# Patient Record
Sex: Female | Born: 1990
Health system: Southern US, Community
[De-identification: ages and names within clinical notes are randomized; demographics above are authoritative.]

## PROBLEM LIST (undated history)

## (undated) ENCOUNTER — Inpatient Hospital Stay (HOSPITAL_COMMUNITY): Payer: Self-pay

## (undated) DIAGNOSIS — R0600 Dyspnea, unspecified: Secondary | ICD-10-CM

## (undated) DIAGNOSIS — J45909 Unspecified asthma, uncomplicated: Secondary | ICD-10-CM

## (undated) DIAGNOSIS — F419 Anxiety disorder, unspecified: Secondary | ICD-10-CM

## (undated) DIAGNOSIS — I10 Essential (primary) hypertension: Secondary | ICD-10-CM

## (undated) DIAGNOSIS — F32A Depression, unspecified: Secondary | ICD-10-CM

## (undated) DIAGNOSIS — I639 Cerebral infarction, unspecified: Secondary | ICD-10-CM

## (undated) DIAGNOSIS — Z915 Personal history of self-harm: Secondary | ICD-10-CM

## (undated) DIAGNOSIS — IMO0002 Reserved for concepts with insufficient information to code with codable children: Secondary | ICD-10-CM

## (undated) DIAGNOSIS — F1910 Other psychoactive substance abuse, uncomplicated: Secondary | ICD-10-CM

## (undated) DIAGNOSIS — F329 Major depressive disorder, single episode, unspecified: Secondary | ICD-10-CM

## (undated) DIAGNOSIS — F319 Bipolar disorder, unspecified: Secondary | ICD-10-CM

## (undated) DIAGNOSIS — R519 Headache, unspecified: Secondary | ICD-10-CM

## (undated) DIAGNOSIS — Z9151 Personal history of suicidal behavior: Secondary | ICD-10-CM

## (undated) HISTORY — PX: WISDOM TOOTH EXTRACTION: SHX21

## (undated) HISTORY — DX: Major depressive disorder, single episode, unspecified: F32.9

## (undated) HISTORY — DX: Reserved for concepts with insufficient information to code with codable children: IMO0002

## (undated) HISTORY — PX: DILATION AND CURETTAGE OF UTERUS: SHX78

## (undated) HISTORY — DX: Depression, unspecified: F32.A

---
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
## 2014-09-02 ENCOUNTER — Encounter (HOSPITAL_COMMUNITY): Payer: Self-pay | Admitting: *Deleted

## 2014-09-02 ENCOUNTER — Emergency Department (HOSPITAL_COMMUNITY)
Admission: EM | Admit: 2014-09-02 | Discharge: 2014-09-02 | Disposition: A | Discharge status: Home / Self Care | Payer: Medicaid Other | Attending: Emergency Medicine | Admitting: Emergency Medicine

## 2014-09-02 ENCOUNTER — Emergency Department (HOSPITAL_COMMUNITY): Payer: Medicaid Other

## 2014-09-02 DIAGNOSIS — Z720 Tobacco use: Secondary | ICD-10-CM | POA: Diagnosis not present

## 2014-09-02 DIAGNOSIS — Z3490 Encounter for supervision of normal pregnancy, unspecified, unspecified trimester: Secondary | ICD-10-CM

## 2014-09-02 DIAGNOSIS — Y998 Other external cause status: Secondary | ICD-10-CM | POA: Insufficient documentation

## 2014-09-02 DIAGNOSIS — Y9289 Other specified places as the place of occurrence of the external cause: Secondary | ICD-10-CM | POA: Insufficient documentation

## 2014-09-02 DIAGNOSIS — IMO0002 Reserved for concepts with insufficient information to code with codable children: Secondary | ICD-10-CM

## 2014-09-02 DIAGNOSIS — Z3A01 Less than 8 weeks gestation of pregnancy: Secondary | ICD-10-CM | POA: Diagnosis not present

## 2014-09-02 DIAGNOSIS — I10 Essential (primary) hypertension: Secondary | ICD-10-CM | POA: Insufficient documentation

## 2014-09-02 DIAGNOSIS — W19XXXA Unspecified fall, initial encounter: Secondary | ICD-10-CM

## 2014-09-02 DIAGNOSIS — O9A211 Injury, poisoning and certain other consequences of external causes complicating pregnancy, first trimester: Secondary | ICD-10-CM | POA: Insufficient documentation

## 2014-09-02 DIAGNOSIS — Y9389 Activity, other specified: Secondary | ICD-10-CM | POA: Insufficient documentation

## 2014-09-02 DIAGNOSIS — Z8673 Personal history of transient ischemic attack (TIA), and cerebral infarction without residual deficits: Secondary | ICD-10-CM | POA: Insufficient documentation

## 2014-09-02 DIAGNOSIS — S0181XA Laceration without foreign body of other part of head, initial encounter: Secondary | ICD-10-CM | POA: Diagnosis not present

## 2014-09-02 DIAGNOSIS — W010XXA Fall on same level from slipping, tripping and stumbling without subsequent striking against object, initial encounter: Secondary | ICD-10-CM | POA: Insufficient documentation

## 2014-09-02 HISTORY — DX: Essential (primary) hypertension: I10

## 2014-09-02 HISTORY — DX: Cerebral infarction, unspecified: I63.9

## 2014-09-02 LAB — COMPREHENSIVE METABOLIC PANEL
ALT: 22 U/L (ref 14–54)
AST: 17 U/L (ref 15–41)
Albumin: 3.9 g/dL (ref 3.5–5.0)
Alkaline Phosphatase: 93 U/L (ref 38–126)
Anion gap: 6 (ref 5–15)
BUN: 7 mg/dL (ref 6–20)
CO2: 23 mmol/L (ref 22–32)
Calcium: 9.3 mg/dL (ref 8.9–10.3)
Chloride: 109 mmol/L (ref 101–111)
Creatinine, Ser: 0.73 mg/dL (ref 0.44–1.00)
GFR calc Af Amer: >60 mL/min (ref >60)
GFR calc non Af Amer: >60 mL/min (ref >60)
Glucose, Bld: 98 mg/dL (ref 65–99)
Potassium: 4.1 mmol/L (ref 3.5–5.1)
Sodium: 138 mmol/L (ref 135–145)
Total Bilirubin: 0.4 mg/dL (ref 0.3–1.2)
Total Protein: 6.9 g/dL (ref 6.5–8.1)

## 2014-09-02 LAB — CBC
HCT: 37.6 % (ref 36.0–46.0)
Hemoglobin: 12.5 g/dL (ref 12.0–15.0)
MCH: 28.7 pg (ref 26.0–34.0)
MCHC: 33.2 g/dL (ref 30.0–36.0)
MCV: 86.2 fL (ref 78.0–100.0)
Platelets: 319 10*3/uL (ref 150–400)
RBC: 4.36 MIL/uL (ref 3.87–5.11)
RDW: 12.8 % (ref 11.5–15.5)
WBC: 8.6 10*3/uL (ref 4.0–10.5)

## 2014-09-02 LAB — I-STAT BETA HCG BLOOD, ED (MC, WL, AP ONLY): I-stat hCG, quantitative: >2000 m[IU]/mL — ABNORMAL HIGH (ref <5)

## 2014-09-02 LAB — I-STAT TROPONIN, ED: Troponin i, poc: 0.02 ng/mL (ref 0.00–0.08)

## 2014-09-02 MED ORDER — LIDOCAINE-EPINEPHRINE (PF) 2 %-1:200000 IJ SOLN
10.0000 mL | Freq: Once | INTRAMUSCULAR | Status: AC
  Administered 2014-09-02: 10 mL via INTRADERMAL
  Filled 2014-09-02: qty 20

## 2014-09-02 NOTE — ED Provider Notes (Signed)
CSN: [id number]     Arrival date & time 09/02/14  1536 History   First MD Initiated Contact with Patient 09/02/14 1717     Chief Complaint  Patient presents with  . Facial Laceration     (Consider location/radiation/quality/duration/timing/severity/associated sxs/prior Treatment) HPI Julia Hopkins is a 24 year old female with a history of stroke at the age of 76 who presents to the ED after chasing her dog a couple of hours ago. She states that while chasing the dog she slipped on the water bowl and fell hitting her chin. She denies any loss of consciousness and was able to ambulate at the scene. She also states that she would like a pregnancy test since she has not had a period in the last 2 months. She states that she has been feeling dizzy since her fall. She states her abdomen has felt full for the past couple of weeks. She states she just recently moved from [place]. She is to be on hypertension medication but says she has not followed up with a PCP yet. Past Medical History  Diagnosis Date  . Stroke   . Hypertension    History reviewed. No pertinent past surgical history. No family history on file. History  Substance Use Topics  . Smoking status: Current Every Day Smoker  . Smokeless tobacco: Not on file  . Alcohol Use: No   OB History    No data available     Review of Systems  Constitutional: Negative for fever and chills.  Respiratory: Negative for chest tightness and shortness of breath.   Cardiovascular: Negative for chest pain.  Gastrointestinal: Negative for nausea, vomiting and abdominal pain.  Genitourinary: Negative for dysuria and pelvic pain.  Neurological: Positive for dizziness. Negative for syncope, weakness, numbness and headaches.  All other systems reviewed and are negative.     Allergies  Review of patient's allergies indicates no known allergies.  Home Medications   Prior to Admission medications   Not on File   BP 109/62 mmHg  Pulse 80   Temp(Src) 100.3 F (37.9 C) (Oral)  Resp 16  SpO2 100%  LMP 07/03/2014 Physical Exam  Constitutional: She is oriented to person, place, and time. She appears well-developed and well-nourished.  HENT:  Head: Normocephalic.  Eyes: Conjunctivae are normal.  Neck: Normal range of motion. Neck supple.  Cardiovascular: Normal rate, regular rhythm and normal heart sounds.   Pulmonary/Chest: Effort normal and breath sounds normal.  Abdominal: Soft. There is no tenderness.  Musculoskeletal: Normal range of motion.  Neurological: She is alert and oriented to person, place, and time. She has normal strength. No sensory deficit. GCS eye subscore is 4. GCS verbal subscore is 5. GCS motor subscore is 6.  Cranial nerves III-XII in tact.   Skin: Skin is warm and dry.  1.5 centimeter laceration to the chin  Nursing note and vitals reviewed.   ED Course  Procedures (including critical care time) LACERATION REPAIR Performed by: Catha Gosselin Authorized by: Catha Gosselin Consent: Verbal consent obtained. Risks and benefits: risks, benefits and alternatives were discussed Consent given by: patient Patient identity confirmed: provided demographic data Prepped and Draped in normal sterile fashion Wound explored Laceration Location: chin Laceration Length: 1.5cm No Foreign Bodies seen or palpated Anesthesia: local infiltration Local anesthetic: lidocaine 2% with epinephrine Anesthetic total: 2 ml Irrigation method: syringe Amount of cleaning: standard Skin closure: 6.0 prolene Number of sutures: 4 Technique: Single interupted Patient tolerance: Patient tolerated the procedure well with no immediate complications. I  applied bacitracin over the wound.  Labs Review Labs Reviewed  I-STAT BETA HCG BLOOD, ED ([facility], [facility], [facility] ONLY) - Abnormal; Notable for the following:    I-stat hCG, quantitative >2000.0 (*)    All other components within normal limits  CBC  COMPREHENSIVE METABOLIC PANEL   I-STAT TROPOININ, ED    Imaging Review US Ob Comp Less 14 Wks  09/02/2014   CLINICAL DATA:  Status post fall; concern for injury. Initial encounter.  EXAM: OBSTETRIC <14 WK [place] AND TRANSVAGINAL OB US  TECHNIQUE: Both transabdominal and transvaginal ultrasound examinations were performed for complete evaluation of the gestation as well as the maternal uterus, adnexal regions, and pelvic cul-de-sac. Transvaginal technique was performed to assess early pregnancy.  COMPARISON:  None.  FINDINGS: Intrauterine gestational sac: Visualized/normal in shape.  Yolk sac:  Yes  Embryo:  Apparent embryo visualized.  Cardiac Activity: No  MSD: 12.2  mm   6 w   0  d  CRL:  5.9  mm   6 w   3 d                  [place] EDC: 04/23/2015  Maternal uterus/adnexae: No subchorionic hemorrhage is noted. The uterus is otherwise unremarkable in appearance.  The right ovary measures 2.8 x 1.7 x 1.6 cm, while the left ovary measures 4.9 x 3.4 x 3.8 cm. This appears to reflect a slightly complex cyst at the left ovary, with dependent internal echoes and an apparent thin septation. The cyst measures approximately 3.8 cm in size. There is no evidence for ovarian torsion. No suspicious adnexal masses are seen.  No free fluid is seen within the pelvic cul-de-sac.  IMPRESSION: 1. Single intrauterine gestational sac noted, with a yolk sac visualized. Apparent embryo also seen. The crown-rump length of 6 mm corresponds to a gestational age of [redacted] weeks 3 days. No fetal heartbeat yet visualized, which may remain within normal limits. Would perform follow-up pelvic ultrasound in 2 weeks, if quantitative beta HCG level continues to trend upward. This does not match the gestational age by LMP, and reflects a new estimated date of delivery of April 23, 2015. 2. Slightly complex left ovarian cyst noted, with a few dependent internal echoes and apparent thin septation, measuring 3.8 cm in size. This is likely still physiologic in nature.   Electronically  Signed   By: Roanna Raider M.D.   On: 09/02/2014 20:03   US Ob Transvaginal  09/02/2014   CLINICAL DATA:  Status post fall; concern for injury. Initial encounter.  EXAM: OBSTETRIC <14 WK [place] AND TRANSVAGINAL OB US  TECHNIQUE: Both transabdominal and transvaginal ultrasound examinations were performed for complete evaluation of the gestation as well as the maternal uterus, adnexal regions, and pelvic cul-de-sac. Transvaginal technique was performed to assess early pregnancy.  COMPARISON:  None.  FINDINGS: Intrauterine gestational sac: Visualized/normal in shape.  Yolk sac:  Yes  Embryo:  Apparent embryo visualized.  Cardiac Activity: No  MSD: 12.2  mm   6 w   0  d  CRL:  5.9  mm   6 w   3 d                  [place] EDC: 04/23/2015  Maternal uterus/adnexae: No subchorionic hemorrhage is noted. The uterus is otherwise unremarkable in appearance.  The right ovary measures 2.8 x 1.7 x 1.6 cm, while the left ovary measures 4.9 x 3.4 x 3.8 cm. This appears to reflect a slightly  complex cyst at the left ovary, with dependent internal echoes and an apparent thin septation. The cyst measures approximately 3.8 cm in size. There is no evidence for ovarian torsion. No suspicious adnexal masses are seen.  No free fluid is seen within the pelvic cul-de-sac.  IMPRESSION: 1. Single intrauterine gestational sac noted, with a yolk sac visualized. Apparent embryo also seen. The crown-rump length of 6 mm corresponds to a gestational age of [redacted] weeks 3 days. No fetal heartbeat yet visualized, which may remain within normal limits. Would perform follow-up pelvic ultrasound in 2 weeks, if quantitative beta HCG level continues to trend upward. This does not match the gestational age by LMP, and reflects a new estimated date of delivery of April 23, 2015. 2. Slightly complex left ovarian cyst noted, with a few dependent internal echoes and apparent thin septation, measuring 3.8 cm in size. This is likely still physiologic in nature.    Electronically Signed   By: Roanna RaiderJeffery  Chang M.D.   On: 09/02/2014 20:03   EKG interpretation 09/02/14 16:16 Vent. rate 95 BPM PR interval 132 ms QRS duration 80 ms QT/QTc 338/424 ms P-R-T axes 71 80 48 Normal Sinus rhythm I interpreted this EKG. Catha GosselinHanna Patel-Mills, PA-C. MDM   Final diagnoses:  Pregnant  Laceration  Fall, initial encounter  Patient presents after slip and fall in water while chasing dogs earlier today. She has a 1.5 cm laceration to the chin. Patient's pregnancy test was positive with i-STAT hCG greater than 2000. She was unaware that she was pregnant. I placed 4 sutures into the chin area and applied bacitracin. She states that her dizziness is since resolved.  Her vitals are stable area and her labs are unremarkable. On ultrasound and she has a single intrauterine gestational sac insistent with gestational age of [redacted] weeks and 3 days. No fetal heartbeat visualized. I have discussed the findings with the patient. She is laughing with her significant other and cheerful about her pregnancy. He appears in no acute distress. I discussed that she would need follow-up in 48 hours with women's outpatient clinic for repeat hCG. She is to have sutures removed in 5 days and can do this at urgent care. She agrees with the plan.    Catha GosselinHanna Patel-Mills, PA-C 09/02/14 2147  Geoffery Lyonsouglas Delo, MD 09/03/14 [phone]

## 2014-09-02 NOTE — Discharge Instructions (Signed)
Laceration Care, Adult Have sutures removed in 5 days. Take prenatal vitamins and follow up with women's outpatient clinic.  A laceration is a cut or lesion that goes through all layers of the skin and into the tissue just beneath the skin. TREATMENT  Some lacerations may not require closure. Some lacerations may not be able to be closed due to an increased risk of infection. It is important to see your caregiver as soon as possible after an injury to minimize the risk of infection and maximize the opportunity for successful closure. If closure is appropriate, pain medicines may be given, if needed. The wound will be cleaned to help prevent infection. Your caregiver will use stitches (sutures), staples, wound glue (adhesive), or skin adhesive strips to repair the laceration. These tools bring the skin edges together to allow for faster healing and a better cosmetic outcome. However, all wounds will heal with a scar. Once the wound has healed, scarring can be minimized by covering the wound with sunscreen during the day for 1 full year. HOME CARE INSTRUCTIONS  For sutures or staples:  Keep the wound clean and dry.  If you were given a bandage (dressing), you should change it at least once a day. Also, change the dressing if it becomes wet or dirty, or as directed by your caregiver.  Wash the wound with soap and water 2 times a day. Rinse the wound off with water to remove all soap. Pat the wound dry with a clean towel.  After cleaning, apply a thin layer of the antibiotic ointment as recommended by your caregiver. This will help prevent infection and keep the dressing from sticking.  You may shower as usual after the first 24 hours. Do not soak the wound in water until the sutures are removed.  Only take over-the-counter or prescription medicines for pain, discomfort, or fever as directed by your caregiver.  Get your sutures or staples removed as directed by your caregiver. For skin adhesive  strips:  Keep the wound clean and dry.  Do not get the skin adhesive strips wet. You may bathe carefully, using caution to keep the wound dry.  If the wound gets wet, pat it dry with a clean towel.  Skin adhesive strips will fall off on their own. You may trim the strips as the wound heals. Do not remove skin adhesive strips that are still stuck to the wound. They will fall off in time. For wound adhesive:  You may briefly wet your wound in the shower or bath. Do not soak or scrub the wound. Do not swim. Avoid periods of heavy perspiration until the skin adhesive has fallen off on its own. After showering or bathing, gently pat the wound dry with a clean towel.  Do not apply liquid medicine, cream medicine, or ointment medicine to your wound while the skin adhesive is in place. This may loosen the film before your wound is healed.  If a dressing is placed over the wound, be careful not to apply tape directly over the skin adhesive. This may cause the adhesive to be pulled off before the wound is healed.  Avoid prolonged exposure to sunlight or tanning lamps while the skin adhesive is in place. Exposure to ultraviolet light in the first year will darken the scar.  The skin adhesive will usually remain in place for 5 to 10 days, then naturally fall off the skin. Do not pick at the adhesive film. You may need a tetanus shot if:  You cannot remember when you had your last tetanus shot.  You have never had a tetanus shot. If you get a tetanus shot, your arm may swell, get red, and feel warm to the touch. This is common and not a problem. If you need a tetanus shot and you choose not to have one, there is a rare chance of getting tetanus. Sickness from tetanus can be serious. SEEK MEDICAL CARE IF:   You have redness, swelling, or increasing pain in the wound.  You see a red line that goes away from the wound.  You have yellowish-white fluid (pus) coming from the wound.  You have a  fever.  You notice a bad smell coming from the wound or dressing.  Your wound breaks open before or after sutures have been removed.  You notice something coming out of the wound such as wood or glass.  Your wound is on your hand or foot and you cannot move a finger or toe. SEEK IMMEDIATE MEDICAL CARE IF:   Your pain is not controlled with prescribed medicine.  You have severe swelling around the wound causing pain and numbness or a change in color in your arm, hand, leg, or foot.  Your wound splits open and starts bleeding.  You have worsening numbness, weakness, or loss of function of any joint around or beyond the wound.  You develop painful lumps near the wound or on the skin anywhere on your body. MAKE SURE YOU:   Understand these instructions.  Will watch your condition.  Will get help right away if you are not doing well or get worse. Document Released: 03/25/2005 Document Revised: 06/17/2011 Document Reviewed: 09/18/2010 [facility] Patient Information 2015 [place], [place]. This information is not intended to replace advice given to you by your health care provider. Make sure you discuss any questions you have with your health care provider.  Prenatal Care  WHAT IS PRENATAL CARE?  Prenatal care means health care during your pregnancy, before your baby is born. It is very important to take care of yourself and your baby during your pregnancy by:   Getting early prenatal care. If you know you are pregnant, or think you might be pregnant, call your health care provider as soon as possible. Schedule a visit for a prenatal exam.  Getting regular prenatal care. Follow your health care provider's schedule for blood and other necessary tests. Do not miss appointments.  Doing everything you can to keep yourself and your baby healthy during your pregnancy.  Getting complete care. Prenatal care should include evaluation of the medical, dietary, educational, psychological, and social  needs of you and your significant other. The medical and genetic history of your family and the family of your baby's father should be discussed with your health care provider.  Discussing with your health care provider:  Prescription, over-the-counter, and herbal medicines that you take.  Any history of substance abuse, alcohol use, smoking, and illegal drug use.  Any history of domestic abuse and violence.  Immunizations you have received.  Your nutrition and diet.  The amount of exercise you do.  Any environmental and occupational hazards to which you are exposed.  History of sexually transmitted infections for both you and your partner.  Previous pregnancies you have had. WHY IS PRENATAL CARE SO IMPORTANT?  By regularly seeing your health care provider, you help ensure that problems can be identified early so that they can be treated as soon as possible. Other problems might be prevented. Many  studies have shown that early and regular prenatal care is important for the health of mothers and their babies.  HOW CAN I TAKE CARE OF MYSELF WHILE I AM PREGNANT?  Here are ways to take care of yourself and your baby:   Start or continue taking your multivitamin with 400 micrograms (mcg) of folic acid every day.  Get early and regular prenatal care. It is very important to see a health care provider during your pregnancy. Your health care provider will check at each visit to make sure that you and your baby are healthy. If there are any problems, action can be taken right away to help you and your baby.  Eat a healthy diet that includes:  Fruits.  Vegetables.  Foods low in saturated fat.  Whole grains.  Calcium-rich foods, such as milk, yogurt, and hard cheeses.  Drink 6-8 glasses of liquids a day.  Unless your health care provider tells you not to, try to be physically active for 30 minutes, most days of the week. If you are pressed for time, you can get your activity in  through 10-minute segments, three times a day.  Do not smoke, drink alcohol, or use drugs. These can cause long-term damage to your baby. Talk with your health care provider about steps to take to stop smoking. Talk with a member of your faith community, a counselor, a trusted friend, or your health care provider if you are concerned about your alcohol or drug use.  Ask your health care provider before taking any medicine, even over-the-counter medicines. Some medicines are not safe to take during pregnancy.  Get plenty of rest and sleep.  Avoid hot tubs and saunas during pregnancy.  Do not have X-rays taken unless absolutely necessary and with the recommendation of your health care provider. A lead shield can be placed on your abdomen to protect your baby when X-rays are taken in other parts of your body.  Do not empty the cat litter when you are pregnant. It may contain a parasite that causes an infection called toxoplasmosis, which can cause birth defects. Also, use gloves when working in garden areas used by cats.  Do not eat uncooked or undercooked meats or fish.  Do not eat soft, mold-ripened cheeses (Brie, Camembert, and chevre) or soft, blue-veined cheese (Danish blue and Roquefort).  Stay away from toxic chemicals like:  Insecticides.  Solvents (some cleaners or paint thinners).  Lead.  Mercury.  Sexual intercourse may continue until the end of the pregnancy, unless you have a medical problem or there is a problem with the pregnancy and your health care provider tells you not to.  Do not wear high-heel shoes, especially during the second half of the pregnancy. You can lose your balance and fall.  Do not take long trips, unless absolutely necessary. Be sure to see your health care provider before going on the trip.  Do not sit in one position for more than 2 hours when on a trip.  Take a copy of your medical records when going on a trip. Know where a hospital is located in  the city you are visiting, in case of an emergency.  Most dangerous household products will have pregnancy warnings on their labels. Ask your health care provider about products if you are unsure.  Limit or eliminate your caffeine intake from coffee, tea, sodas, medicines, and chocolate.  Many women continue working through pregnancy. Staying active might help you stay healthier. If you have a question  about the safety or the hours you work at your particular job, talk with your health care provider.  Get informed:  Read books.  Watch videos.  Go to childbirth classes for you and your significant other.  Talk with experienced moms.  Ask your health care provider about childbirth education classes for you and your partner. Classes can help you and your partner prepare for the birth of your baby.  Ask about a baby doctor (pediatrician) and methods and pain medicine for labor, delivery, and possible cesarean delivery. HOW OFTEN SHOULD I SEE MY HEALTH CARE PROVIDER DURING PREGNANCY?  Your health care provider will give you a schedule for your prenatal visits. You will have visits more often as you get closer to the end of your pregnancy. An average pregnancy lasts about 40 weeks.  A typical schedule includes visiting your health care provider:   About once each month during your first 6 months of pregnancy.  Every 2 weeks during the next 2 months.  Weekly in the last month, until the delivery date. Your health care provider will probably want to see you more often if:  You are older than 35 years.  Your pregnancy is high risk because you have certain health problems or problems with the pregnancy, such as:  Diabetes.  High blood pressure.  The baby is not growing on schedule, according to the dates of the pregnancy. Your health care provider will do special tests to make sure you and your baby are not having any serious problems. WHAT HAPPENS DURING PRENATAL VISITS?   At your  first prenatal visit, your health care provider will do a physical exam and talk to you about your health history and the health history of your partner and your family. Your health care provider will be able to tell you what date to expect your baby to be born on.  Your first physical exam will include checks of your blood pressure, measurements of your height and weight, and an exam of your pelvic organs. Your health care provider will do a Pap test if you have not had one recently and will do cultures of your cervix to make sure there is no infection.  At each prenatal visit, there will be tests of your blood, urine, blood pressure, weight, and the progress of the baby will be checked.  At your later prenatal visits, your health care provider will check how you are doing and how your baby is developing. You may have a number of tests done as your pregnancy progresses.  Ultrasound exams are often used to check on your baby's growth and health.  You may have more urine and blood tests, as well as special tests, if needed. These may include amniocentesis to examine fluid in the pregnancy sac, stress tests to check how the baby responds to contractions, or a biophysical profile to measure your baby's well-being. Your health care provider will explain the tests and why they are necessary.  You should be tested for high blood sugar (gestational diabetes) between the 24th and 28th weeks of your pregnancy.  You should discuss with your health care provider your plans to breastfeed or bottle-feed your baby.  Each visit is also a chance for you to learn about staying healthy during pregnancy and to ask questions. Document Released: 03/28/2003 Document Revised: 03/30/2013 Document Reviewed: 06/09/2013 [facility] Patient Information 2015 [place], [place]. This information is not intended to replace advice given to you by your health care provider. Make sure you  discuss any questions you have with your health  care provider.

## 2014-09-02 NOTE — ED Notes (Addendum)
Pt states that she was chasing her dogs when she slipped on water. Pt fell and has an approx 2cm laceration to chin. Bleeding controlled at this time. Pt also wants a pregnancy test due to bing late on period by 2 months.

## 2014-09-02 NOTE — ED Notes (Signed)
Pt reports some dizziness while walking. Pt denied dizziness prior to fall. Pt alert and oriented with no neuro deficits.

## 2014-09-06 ENCOUNTER — Encounter (HOSPITAL_COMMUNITY): Payer: Self-pay | Admitting: Emergency Medicine

## 2014-09-06 ENCOUNTER — Emergency Department (HOSPITAL_COMMUNITY)
Admission: EM | Admit: 2014-09-06 | Discharge: 2014-09-06 | Disposition: A | Discharge status: Home / Self Care | Payer: Medicaid Other | Attending: Emergency Medicine | Admitting: Emergency Medicine

## 2014-09-06 DIAGNOSIS — Z8673 Personal history of transient ischemic attack (TIA), and cerebral infarction without residual deficits: Secondary | ICD-10-CM | POA: Insufficient documentation

## 2014-09-06 DIAGNOSIS — I10 Essential (primary) hypertension: Secondary | ICD-10-CM | POA: Diagnosis not present

## 2014-09-06 DIAGNOSIS — Z720 Tobacco use: Secondary | ICD-10-CM | POA: Diagnosis not present

## 2014-09-06 DIAGNOSIS — Z4802 Encounter for removal of sutures: Secondary | ICD-10-CM | POA: Diagnosis not present

## 2014-09-06 NOTE — ED Provider Notes (Signed)
CSN: [id number]     Arrival date & time 09/06/14  1938 History   First MD Initiated Contact with Patient 09/06/14 2003     Chief Complaint  Patient presents with  . Suture / Staple Removal    (Consider location/radiation/quality/duration/timing/severity/associated sxs/prior Treatment) HPI Comments: Patient is a 24 year old female who presents to the emergency department for further evaluation of suture removal. Patient had sutures placed on 09/02/2014. She states the wound has been healing well without any bleeding or discharge. She denies any fevers. Patient also wanting to have a recheck on her pregnancy. Patient was found to be 6 weeks, 3 days pregnant at her prior visit. She is now approximately [redacted] weeks pregnant based on ultrasound findings. She reports that she tried to follow-up with the women's outpatient clinic, but was told they are no longer taking new patients. She states, "I just wants to make sure that the baby is okay". No vaginal bleeding since patient was last seen.  Patient is a 24 y.o. female presenting with suture removal. The history is provided by the patient. No language interpreter was used.  Suture / Staple Removal Pertinent negatives include no abdominal pain or fever.    Past Medical History  Diagnosis Date  . Stroke   . Hypertension    History reviewed. No pertinent past surgical history. No family history on file. History  Substance Use Topics  . Smoking status: Current Every Day Smoker  . Smokeless tobacco: Not on file  . Alcohol Use: No   OB History    Gravida Para Term Preterm AB TAB SAB Ectopic Multiple Living   1               Review of Systems  Constitutional: Negative for fever.  Gastrointestinal: Negative for abdominal pain.  Genitourinary: Negative for vaginal bleeding.  Skin: Positive for wound.  All other systems reviewed and are negative.   Allergies  Review of patient's allergies indicates no known allergies.  Home Medications    Prior to Admission medications   Not on File   BP 125/86 mmHg  Pulse 85  Temp(Src) 98.7 F (37.1 C) (Oral)  Resp 16  Ht  (1.6 m)  Wt 235 lb (106.595 kg)  BMI 41.64 kg/m2  SpO2 99%  LMP 07/03/2014   Physical Exam  Constitutional: She is oriented to person, place, and time. She appears well-developed and well-nourished. No distress.  Nontoxic/nonseptic appearing  HENT:  Head: Normocephalic.  Well-healed laceration to chin with 4 sutures in place. No erythema or drainage. No induration.  Eyes: Conjunctivae and EOM are normal. No scleral icterus.  Neck: Normal range of motion.  Pulmonary/Chest: Effort normal. No respiratory distress.  Respirations even and unlabored  Musculoskeletal: Normal range of motion.  Neurological: She is alert and oriented to person, place, and time. She exhibits normal muscle tone. Coordination normal.  Skin: Skin is warm and dry. No rash noted. She is not diaphoretic. No erythema. No pallor.  Psychiatric: She has a normal mood and affect. Her behavior is normal.  Nursing note and vitals reviewed.   ED Course  Procedures (including critical care time) Labs Review Labs Reviewed - No data to display  Imaging Review No results found.   EKG Interpretation None      SUTURE REMOVAL Performed by: Antony Madura  Consent: Verbal consent obtained. Patient identity confirmed: provided demographic data Time out: Immediately prior to procedure a "time out" was called to verify the correct patient, procedure, equipment, support staff  and site/side marked as required.  Location details: chin  Wound Appearance: clean  Sutures/Staples Removed: 4  Facility: sutures placed in this facility Patient tolerance: Patient tolerated the procedure well with no immediate complications.    MDM   Final diagnoses:  Encounter for removal of sutures    24 year old female presents to the emergency department for suture removal. Search is removed without  difficulty. No evidence of secondary infection or wound site. Patient also concerned about her pregnancy which she learned of on 09/02/2014. Patient was told to follow-up with the women's outpatient clinic, but was told they are no longer taking patients. As patient is [redacted] weeks pregnant without vaginal bleeding or abdominal pain, I do not see indication for emergent ultrasound. Patient offered to have hCG repeated to ensure rising levels which she initially agreed to and later declined, stating "I just want to go home". Patient advised [facility] as needed for further evaluation of her pregnancy. Patient stable for discharge at this time. Patient with no unaddressed concerns prior to discharge.   Filed Vitals:   09/06/14 1945  BP: 125/86  Pulse: 85  Temp: 98.7 F (37.1 C)  Resp: [address]16     Kemper Heupel, PA-C 09/06/14 2232  Eber HongBrian Miller, MD 09/07/14 [phone]

## 2014-09-06 NOTE — ED Notes (Signed)
Sutures in chin clean and dry, no sign of infection

## 2014-09-06 NOTE — ED Notes (Signed)
Pt. is here for suture removal at lower chin , laceration healed with no bleeding or drainage .

## 2014-09-06 NOTE — Discharge Instructions (Signed)

## 2014-09-12 ENCOUNTER — Inpatient Hospital Stay (HOSPITAL_COMMUNITY)
Admission: AD | Admit: 2014-09-12 | Discharge: 2014-09-12 | Disposition: A | Discharge status: Home / Self Care | Payer: Medicaid Other | Source: Ambulatory Visit | Attending: Obstetrics & Gynecology | Admitting: Obstetrics & Gynecology

## 2014-09-12 ENCOUNTER — Encounter (HOSPITAL_COMMUNITY): Payer: Self-pay | Admitting: *Deleted

## 2014-09-12 DIAGNOSIS — F17210 Nicotine dependence, cigarettes, uncomplicated: Secondary | ICD-10-CM | POA: Diagnosis not present

## 2014-09-12 DIAGNOSIS — O99331 Smoking (tobacco) complicating pregnancy, first trimester: Secondary | ICD-10-CM | POA: Diagnosis not present

## 2014-09-12 DIAGNOSIS — O9989 Other specified diseases and conditions complicating pregnancy, childbirth and the puerperium: Secondary | ICD-10-CM | POA: Insufficient documentation

## 2014-09-12 DIAGNOSIS — R1011 Right upper quadrant pain: Secondary | ICD-10-CM | POA: Insufficient documentation

## 2014-09-12 DIAGNOSIS — Z8673 Personal history of transient ischemic attack (TIA), and cerebral infarction without residual deficits: Secondary | ICD-10-CM | POA: Insufficient documentation

## 2014-09-12 DIAGNOSIS — Z3A08 8 weeks gestation of pregnancy: Secondary | ICD-10-CM | POA: Insufficient documentation

## 2014-09-12 DIAGNOSIS — K5900 Constipation, unspecified: Secondary | ICD-10-CM | POA: Diagnosis not present

## 2014-09-12 HISTORY — DX: Unspecified asthma, uncomplicated: J45.909

## 2014-09-12 LAB — HCG, QUANTITATIVE, PREGNANCY: hCG, Beta Chain, Quant, S: 82310 m[IU]/mL — ABNORMAL HIGH (ref <5)

## 2014-09-12 MED ORDER — DOCUSATE SODIUM 250 MG PO CAPS: 250.0000 mg | ORAL_CAPSULE | Freq: Two times a day (BID) | ORAL | Status: DC

## 2014-09-12 NOTE — MAU Note (Signed)
Hx of miscarriage, [facility]. Wanting to get frequent checks to make sure all is ok with preg.    Is having sharp pain mid right side, started 3 days ago.  Feel kind of nauseous most of the time

## 2014-09-12 NOTE — Discharge Instructions (Signed)
First Trimester of Pregnancy The first trimester of pregnancy is from week 1 until the end of week 12 (months 1 through 3). A week after a sperm fertilizes an egg, the egg will implant on the wall of the uterus. This embryo will begin to develop into a baby. Genes from you and your partner are forming the baby. The female genes determine whether the baby is a boy or a girl. At 6-8 weeks, the eyes and face are formed, and the heartbeat can be seen on ultrasound. At the end of 12 weeks, all the baby's organs are formed.  Now that you are pregnant, you will want to do everything you can to have a healthy baby. Two of the most important things are to get good prenatal care and to follow your health care provider's instructions. Prenatal care is all the medical care you receive before the baby's birth. This care will help prevent, find, and treat any problems during the pregnancy and childbirth. BODY CHANGES Your body goes through many changes during pregnancy. The changes vary from woman to woman.   You may gain or lose a couple of pounds at first.  You may feel sick to your stomach (nauseous) and throw up (vomit). If the vomiting is uncontrollable, call your health care provider.  You may tire easily.  You may develop headaches that can be relieved by medicines approved by your health care provider.  You may urinate more often. Painful urination may mean you have a bladder infection.  You may develop heartburn as a result of your pregnancy.  You may develop constipation because certain hormones are causing the muscles that push waste through your intestines to slow down.  You may develop hemorrhoids or swollen, bulging veins (varicose veins).  Your breasts may begin to grow larger and become tender. Your nipples may stick out more, and the tissue that surrounds them (areola) may become darker.  Your gums may bleed and may be sensitive to brushing and flossing.  Dark spots or blotches (chloasma,  mask of pregnancy) may develop on your face. This will likely fade after the baby is born.  Your menstrual periods will stop.  You may have a loss of appetite.  You may develop cravings for certain kinds of food.  You may have changes in your emotions from day to day, such as being excited to be pregnant or being concerned that something may go wrong with the pregnancy and baby.  You may have more vivid and strange dreams.  You may have changes in your hair. These can include thickening of your hair, rapid growth, and changes in texture. Some women also have hair loss during or after pregnancy, or hair that feels dry or thin. Your hair will most likely return to normal after your baby is born. WHAT TO EXPECT AT YOUR PRENATAL VISITS During a routine prenatal visit:  You will be weighed to make sure you and the baby are growing normally.  Your blood pressure will be taken.  Your abdomen will be measured to track your baby's growth.  The fetal heartbeat will be listened to starting around week 10 or 12 of your pregnancy.  Test results from any previous visits will be discussed. Your health care provider may ask you:  How you are feeling.  If you are feeling the baby move.  If you have had any abnormal symptoms, such as leaking fluid, bleeding, severe headaches, or abdominal cramping.  If you have any questions. Other tests   that may be performed during your first trimester include:  Blood tests to find your blood type and to check for the presence of any previous infections. They will also be used to check for low iron levels (anemia) and Rh antibodies. Later in the pregnancy, blood tests for diabetes will be done along with other tests if problems develop.  Urine tests to check for infections, diabetes, or protein in the urine.  An ultrasound to confirm the proper growth and development of the baby.  An amniocentesis to check for possible genetic problems.  Fetal screens for  spina bifida and Down syndrome.  You may need other tests to make sure you and the baby are doing well. HOME CARE INSTRUCTIONS  Medicines  Follow your health care provider's instructions regarding medicine use. Specific medicines may be either safe or unsafe to take during pregnancy.  Take your prenatal vitamins as directed.  If you develop constipation, try taking a stool softener if your health care provider approves. Diet  Eat regular, well-balanced meals. Choose a variety of foods, such as meat or vegetable-based protein, fish, milk and low-fat dairy products, vegetables, fruits, and whole grain breads and cereals. Your health care provider will help you determine the amount of weight gain that is right for you.  Avoid raw meat and uncooked cheese. These carry germs that can cause birth defects in the baby.  Eating four or five small meals rather than three large meals a day may help relieve nausea and vomiting. If you start to feel nauseous, eating a few soda crackers can be helpful. Drinking liquids between meals instead of during meals also seems to help nausea and vomiting.  If you develop constipation, eat more high-fiber foods, such as fresh vegetables or fruit and whole grains. Drink enough fluids to keep your urine clear or pale yellow. Activity and Exercise  Exercise only as directed by your health care provider. Exercising will help you:  Control your weight.  Stay in shape.  Be prepared for labor and delivery.  Experiencing pain or cramping in the lower abdomen or low back is a good sign that you should stop exercising. Check with your health care provider before continuing normal exercises.  Try to avoid standing for long periods of time. Move your legs often if you must stand in one place for a long time.  Avoid heavy lifting.  Wear low-heeled shoes, and practice good posture.  You may continue to have sex unless your health care provider directs you  otherwise. Relief of Pain or Discomfort  Wear a good support bra for breast tenderness.   Take warm sitz baths to soothe any pain or discomfort caused by hemorrhoids. Use hemorrhoid cream if your health care provider approves.   Rest with your legs elevated if you have leg cramps or low back pain.  If you develop varicose veins in your legs, wear support hose. Elevate your feet for 15 minutes, 3-4 times a day. Limit salt in your diet. Prenatal Care  Schedule your prenatal visits by the twelfth week of pregnancy. They are usually scheduled monthly at first, then more often in the last 2 months before delivery.  Write down your questions. Take them to your prenatal visits.  Keep all your prenatal visits as directed by your health care provider. Safety  Wear your seat belt at all times when driving.  Make a list of emergency phone numbers, including numbers for family, friends, the hospital, and police and fire departments. General Tips    Ask your health care provider for a referral to a local prenatal education class. Begin classes no later than at the beginning of month 6 of your pregnancy.  Ask for help if you have counseling or nutritional needs during pregnancy. Your health care provider can offer advice or refer you to specialists for help with various needs.  Do not use hot tubs, steam rooms, or saunas.  Do not douche or use tampons or scented sanitary pads.  Do not cross your legs for long periods of time.  Avoid cat litter boxes and soil used by cats. These carry germs that can cause birth defects in the baby and possibly loss of the fetus by miscarriage or stillbirth.  Avoid all smoking, herbs, alcohol, and medicines not prescribed by your health care provider. Chemicals in these affect the formation and growth of the baby.  Schedule a dentist appointment. At home, brush your teeth with a soft toothbrush and be gentle when you floss. SEEK MEDICAL CARE IF:   You have  dizziness.  You have mild pelvic cramps, pelvic pressure, or nagging pain in the abdominal area.  You have persistent nausea, vomiting, or diarrhea.  You have a bad smelling vaginal discharge.  You have pain with urination.  You notice increased swelling in your face, hands, legs, or ankles. SEEK IMMEDIATE MEDICAL CARE IF:   You have a fever.  You are leaking fluid from your vagina.  You have spotting or bleeding from your vagina.  You have severe abdominal cramping or pain.  You have rapid weight gain or loss.  You vomit blood or material that looks like coffee grounds.  You are exposed to German measles and have never had them.  You are exposed to fifth disease or chickenpox.  You develop a severe headache.  You have shortness of breath.  You have any kind of trauma, such as from a fall or a car accident. Document Released: 03/19/2001 Document Revised: 08/09/2013 Document Reviewed: 02/02/2013 Exit[employer] Patient Information 2015 [employer]. This information is not intended to replace advice given to you by your health care provider. Make sure you discuss any questions you have with your health care provider.  

## 2014-09-12 NOTE — MAU Provider Note (Signed)
  History   G2P0010 at 8.1 wks in with concerns regarding her pregnancy. C/o some intermittant right upper quad pain and constipation.  CSN: [id number]  Arrival date and time: 09/12/14 1621   None     No chief complaint on file.  HPI  OB History    Gravida Para Term Preterm AB TAB SAB Ectopic Multiple Living   2    1  1          Past Medical History  Diagnosis Date  . Hypertension   . Stroke     2014  . Asthma     exercise induced    Past Surgical History  Procedure Laterality Date  . Wisdom tooth extraction    . Dilation and curettage of uterus      History reviewed. No pertinent family history.  History  Substance Use Topics  . Smoking status: Current Every Day Smoker -- 0.25 packs/day    Types: Cigarettes  . Smokeless tobacco: Not on file  . Alcohol Use: No    Allergies: No Known Allergies  No prescriptions prior to admission    Review of Systems  Constitutional: Negative.   HENT: Negative.   Eyes: Negative.   Respiratory: Negative.   Gastrointestinal: Positive for nausea and constipation.  Genitourinary: Negative.   Musculoskeletal: Negative.   Skin: Negative.   Neurological: Negative.   Endo/Heme/Allergies: Negative.   Psychiatric/Behavioral: Negative.    Physical Exam   Blood pressure 128/76, pulse 76, temperature 98.3 F (36.8 C), temperature source Oral, resp. rate 20, weight 239 lb (108.41 kg), last menstrual period 07/03/2014.  Physical Exam  Constitutional: She is oriented to person, place, and time. She appears well-developed and well-nourished.  HENT:  Head: Normocephalic.  Eyes: Pupils are equal, round, and reactive to light.  Neck: Normal range of motion.  Cardiovascular: Normal rate, regular rhythm, normal heart sounds and intact distal pulses.   Respiratory: Effort normal and breath sounds normal.  GI: Soft. Bowel sounds are normal.  Genitourinary: Vagina normal and uterus normal.  Musculoskeletal: Normal range of motion.   Neurological: She is alert and oriented to person, place, and time. She has normal reflexes.  Skin: Skin is warm and dry.  Psychiatric: She has a normal mood and affect. Her behavior is normal. Judgment and thought content normal.    MAU Course  Procedures  MDM Constipation in early pregnancy  Assessment and Plan  Colace BID. And [facility]  Dawood Spitler DARLENE 09/12/2014, 6:22 PM

## 2014-09-12 NOTE — MAU Note (Signed)
Urine in lab 

## 2014-10-13 ENCOUNTER — Other Ambulatory Visit (HOSPITAL_COMMUNITY): Payer: Self-pay | Admitting: Nurse Practitioner

## 2014-10-13 DIAGNOSIS — Z8673 Personal history of transient ischemic attack (TIA), and cerebral infarction without residual deficits: Secondary | ICD-10-CM

## 2014-10-13 DIAGNOSIS — Z3682 Encounter for antenatal screening for nuchal translucency: Secondary | ICD-10-CM

## 2014-10-13 DIAGNOSIS — Z3A18 18 weeks gestation of pregnancy: Secondary | ICD-10-CM

## 2014-10-13 DIAGNOSIS — O10912 Unspecified pre-existing hypertension complicating pregnancy, second trimester: Secondary | ICD-10-CM

## 2014-10-13 LAB — OB RESULTS CONSOLE HGB/HCT, BLOOD
HCT: 37 %
Hemoglobin: 12.7 g/dL

## 2014-10-13 LAB — OB RESULTS CONSOLE ANTIBODY SCREEN: Antibody Screen: NEGATIVE

## 2014-10-13 LAB — OB RESULTS CONSOLE RPR
RPR: NONREACTIVE
RPR: NONREACTIVE

## 2014-10-13 LAB — OB RESULTS CONSOLE GC/CHLAMYDIA
Chlamydia: POSITIVE
Chlamydia: POSITIVE
Gonorrhea: NEGATIVE
Gonorrhea: NEGATIVE

## 2014-10-13 LAB — GLUCOSE TOLERANCE, 1 HOUR (50G) W/O FASTING: Glucose, GTT - 1 Hour: 83 mg/dL (ref ?–200)

## 2014-10-13 LAB — OB RESULTS CONSOLE VARICELLA ZOSTER ANTIBODY, IGG: Varicella: IMMUNE

## 2014-10-13 LAB — SICKLE CELL SCREEN: Sickle Cell Screen: NORMAL

## 2014-10-13 LAB — OB RESULTS CONSOLE HEPATITIS B SURFACE ANTIGEN: Hepatitis B Surface Ag: NEGATIVE

## 2014-10-13 LAB — CYSTIC FIBROSIS DIAGNOSTIC STUDY: Interpretation-CFDNA:: NEGATIVE

## 2014-10-13 LAB — CYTOLOGY - PAP: Pap Smear: NORMAL

## 2014-10-13 LAB — OB RESULTS CONSOLE RUBELLA ANTIBODY, IGM: Rubella: IMMUNE

## 2014-10-13 LAB — OB RESULTS CONSOLE ABO/RH: RH Type: POSITIVE

## 2014-10-13 LAB — OB RESULTS CONSOLE PLATELET COUNT: Platelets: 290 10*3/uL

## 2014-10-14 ENCOUNTER — Ambulatory Visit (HOSPITAL_COMMUNITY)
Admission: RE | Admit: 2014-10-14 | Discharge: 2014-10-14 | Disposition: A | Discharge status: Home / Self Care | Payer: Medicaid Other | Source: Ambulatory Visit | Attending: Nurse Practitioner | Admitting: Nurse Practitioner

## 2014-10-14 ENCOUNTER — Encounter (HOSPITAL_COMMUNITY): Payer: Self-pay

## 2014-10-14 ENCOUNTER — Ambulatory Visit (HOSPITAL_COMMUNITY): Admission: RE | Admit: 2014-10-14 | Payer: Medicaid Other | Source: Ambulatory Visit

## 2014-10-14 DIAGNOSIS — Z3A12 12 weeks gestation of pregnancy: Secondary | ICD-10-CM | POA: Diagnosis not present

## 2014-10-14 DIAGNOSIS — Z36 Encounter for antenatal screening of mother: Secondary | ICD-10-CM | POA: Insufficient documentation

## 2014-10-14 DIAGNOSIS — Z3682 Encounter for antenatal screening for nuchal translucency: Secondary | ICD-10-CM

## 2014-10-24 ENCOUNTER — Encounter: Payer: Self-pay | Admitting: Obstetrics and Gynecology

## 2014-10-24 ENCOUNTER — Ambulatory Visit (INDEPENDENT_AMBULATORY_CARE_PROVIDER_SITE_OTHER): Payer: Self-pay | Admitting: Obstetrics and Gynecology

## 2014-10-24 VITALS — BP 105/88 | HR 82 | Temp 98.0°F | Ht 62.0 in | Wt 234.8 lb

## 2014-10-24 DIAGNOSIS — O162 Unspecified maternal hypertension, second trimester: Secondary | ICD-10-CM

## 2014-10-24 DIAGNOSIS — O98812 Other maternal infectious and parasitic diseases complicating pregnancy, second trimester: Secondary | ICD-10-CM

## 2014-10-24 DIAGNOSIS — O99322 Drug use complicating pregnancy, second trimester: Secondary | ICD-10-CM

## 2014-10-24 DIAGNOSIS — E669 Obesity, unspecified: Secondary | ICD-10-CM

## 2014-10-24 DIAGNOSIS — I10 Essential (primary) hypertension: Secondary | ICD-10-CM

## 2014-10-24 DIAGNOSIS — A749 Chlamydial infection, unspecified: Secondary | ICD-10-CM

## 2014-10-24 DIAGNOSIS — O99213 Obesity complicating pregnancy, third trimester: Secondary | ICD-10-CM

## 2014-10-24 DIAGNOSIS — O98312 Other infections with a predominantly sexual mode of transmission complicating pregnancy, second trimester: Secondary | ICD-10-CM

## 2014-10-24 DIAGNOSIS — O0990 Supervision of high risk pregnancy, unspecified, unspecified trimester: Secondary | ICD-10-CM | POA: Insufficient documentation

## 2014-10-24 DIAGNOSIS — O99332 Smoking (tobacco) complicating pregnancy, second trimester: Secondary | ICD-10-CM

## 2014-10-24 DIAGNOSIS — O98819 Other maternal infectious and parasitic diseases complicating pregnancy, unspecified trimester: Secondary | ICD-10-CM | POA: Insufficient documentation

## 2014-10-24 DIAGNOSIS — O99320 Drug use complicating pregnancy, unspecified trimester: Secondary | ICD-10-CM

## 2014-10-24 DIAGNOSIS — F1910 Other psychoactive substance abuse, uncomplicated: Secondary | ICD-10-CM

## 2014-10-24 DIAGNOSIS — O0992 Supervision of high risk pregnancy, unspecified, second trimester: Secondary | ICD-10-CM

## 2014-10-24 DIAGNOSIS — O10919 Unspecified pre-existing hypertension complicating pregnancy, unspecified trimester: Secondary | ICD-10-CM | POA: Insufficient documentation

## 2014-10-24 LAB — POCT URINALYSIS DIP (DEVICE)
Bilirubin Urine: NEGATIVE
Glucose, UA: NEGATIVE mg/dL
Hgb urine dipstick: NEGATIVE
Ketones, ur: NEGATIVE mg/dL
Nitrite: NEGATIVE
Protein, ur: NEGATIVE mg/dL
Specific Gravity, Urine: >=1.03 (ref 1.005–1.030)
Urobilinogen, UA: 0.2 mg/dL (ref 0.0–1.0)
pH: 6 (ref 5.0–8.0)

## 2014-10-24 MED ORDER — ASPIRIN EC 81 MG PO TBEC: 81.0000 mg | DELAYED_RELEASE_TABLET | Freq: Every day | ORAL | Status: DC

## 2014-10-24 NOTE — Progress Notes (Signed)
Pt reports feeling tired.

## 2014-10-24 NOTE — Progress Notes (Signed)
Left message with Diane, the [facility]. Advised order present and notified of patient symptoms. Please call the patient to schedule appointment. Verified phone numbers with the patient prior tocalling.

## 2014-10-24 NOTE — Patient Instructions (Signed)
Second Trimester of Pregnancy The second trimester is from week 13 through week 28, months 4 through 6. The second trimester is often a time when you feel your best. Your body has also adjusted to being pregnant, and you begin to feel better physically. Usually, morning sickness has lessened or quit completely, you may have more energy, and you may have an increase in appetite. The second trimester is also a time when the fetus is growing rapidly. At the end of the sixth month, the fetus is about 9 inches long and weighs about 1 pounds. You will likely begin to feel the baby move (quickening) between 18 and 20 weeks of the pregnancy. BODY CHANGES Your body goes through many changes during pregnancy. The changes vary from woman to woman.   Your weight will continue to increase. You will notice your lower abdomen bulging out.  You may begin to get stretch marks on your hips, abdomen, and breasts.  You may develop headaches that can be relieved by medicines approved by your health care provider.  You may urinate more often because the fetus is pressing on your bladder.  You may develop or continue to have heartburn as a result of your pregnancy.  You may develop constipation because certain hormones are causing the muscles that push waste through your intestines to slow down.  You may develop hemorrhoids or swollen, bulging veins (varicose veins).  You may have back pain because of the weight gain and pregnancy hormones relaxing your joints between the bones in your pelvis and as a result of a shift in weight and the muscles that support your balance.  Your breasts will continue to grow and be tender.  Your gums may bleed and may be sensitive to brushing and flossing.  Dark spots or blotches (chloasma, mask of pregnancy) may develop on your face. This will likely fade after the baby is born.  A dark line from your belly button to the pubic area (linea nigra) may appear. This will likely  fade after the baby is born.  You may have changes in your hair. These can include thickening of your hair, rapid growth, and changes in texture. Some women also have hair loss during or after pregnancy, or hair that feels dry or thin. Your hair will most likely return to normal after your baby is born. WHAT TO EXPECT AT YOUR PRENATAL VISITS During a routine prenatal visit:  You will be weighed to make sure you and the fetus are growing normally.  Your blood pressure will be taken.  Your abdomen will be measured to track your baby's growth.  The fetal heartbeat will be listened to.  Any test results from the previous visit will be discussed. Your health care provider may ask you:  How you are feeling.  If you are feeling the baby move.  If you have had any abnormal symptoms, such as leaking fluid, bleeding, severe headaches, or abdominal cramping.  If you have any questions. Other tests that may be performed during your second trimester include:  Blood tests that check for:  Low iron levels (anemia).  Gestational diabetes (between 24 and 28 weeks).  Rh antibodies.  Urine tests to check for infections, diabetes, or protein in the urine.  An ultrasound to confirm the proper growth and development of the baby.  An amniocentesis to check for possible genetic problems.  Fetal screens for spina bifida and Down syndrome. HOME CARE INSTRUCTIONS   Avoid all smoking, herbs, alcohol, and unprescribed   drugs. These chemicals affect the formation and growth of the baby.  Follow your health care provider's instructions regarding medicine use. There are medicines that are either safe or unsafe to take during pregnancy.  Exercise only as directed by your health care provider. Experiencing uterine cramps is a good sign to stop exercising.  Continue to eat regular, healthy meals.  Wear a good support bra for breast tenderness.  Do not use hot tubs, steam rooms, or saunas.  Wear  your seat belt at all times when driving.  Avoid raw meat, uncooked cheese, cat litter boxes, and soil used by cats. These carry germs that can cause birth defects in the baby.  Take your prenatal vitamins.  Try taking a stool softener (if your health care provider approves) if you develop constipation. Eat more high-fiber foods, such as fresh vegetables or fruit and whole grains. Drink plenty of fluids to keep your urine clear or pale yellow.  Take warm sitz baths to soothe any pain or discomfort caused by hemorrhoids. Use hemorrhoid cream if your health care provider approves.  If you develop varicose veins, wear support hose. Elevate your feet for 15 minutes, 3-4 times a day. Limit salt in your diet.  Avoid heavy lifting, wear low heel shoes, and practice good posture.  Rest with your legs elevated if you have leg cramps or low back pain.  Visit your dentist if you have not gone yet during your pregnancy. Use a soft toothbrush to brush your teeth and be gentle when you floss.  A sexual relationship may be continued unless your health care provider directs you otherwise.  Continue to go to all your prenatal visits as directed by your health care provider. SEEK MEDICAL CARE IF:   You have dizziness.  You have mild pelvic cramps, pelvic pressure, or nagging pain in the abdominal area.  You have persistent nausea, vomiting, or diarrhea.  You have a bad smelling vaginal discharge.  You have pain with urination. SEEK IMMEDIATE MEDICAL CARE IF:   You have a fever.  You are leaking fluid from your vagina.  You have spotting or bleeding from your vagina.  You have severe abdominal cramping or pain.  You have rapid weight gain or loss.  You have shortness of breath with chest pain.  You notice sudden or extreme swelling of your face, hands, ankles, feet, or legs.  You have not felt your baby move in over an hour.  You have severe headaches that do not go away with  medicine.  You have vision changes. Document Released: 03/19/2001 Document Revised: 03/30/2013 Document Reviewed: 05/26/2012 [employer] Patient Information 2015 [employer]. This information is not intended to replace advice given to you by your health care provider. Make sure you discuss any questions you have with your health care provider.  Contraception Choices Contraception (birth control) is the use of any methods or devices to prevent pregnancy. Below are some methods to help avoid pregnancy. HORMONAL METHODS   Contraceptive implant. This is a thin, plastic tube containing progesterone hormone. It does not contain estrogen hormone. Your health care provider inserts the tube in the inner part of the upper arm. The tube can remain in place for up to 3 years. After 3 years, the implant must be removed. The implant prevents the ovaries from releasing an egg (ovulation), thickens the cervical mucus to prevent sperm from entering the uterus, and thins the lining of the inside of the uterus.  Progesterone-only injections. These injections are given   every 3 months by your health care provider to prevent pregnancy. This synthetic progesterone hormone stops the ovaries from releasing eggs. It also thickens cervical mucus and changes the uterine lining. This makes it harder for sperm to survive in the uterus.  Birth control pills. These pills contain estrogen and progesterone hormone. They work by preventing the ovaries from releasing eggs (ovulation). They also cause the cervical mucus to thicken, preventing the sperm from entering the uterus. Birth control pills are prescribed by a health care provider.Birth control pills can also be used to treat heavy periods.  Minipill. This type of birth control pill contains only the progesterone hormone. They are taken every day of each month and must be prescribed by your health care provider.  Birth control patch. The patch contains hormones similar to  those in birth control pills. It must be changed once a week and is prescribed by a health care provider.  Vaginal ring. The ring contains hormones similar to those in birth control pills. It is left in the vagina for 3 weeks, removed for 1 week, and then a new one is put back in place. The patient must be comfortable inserting and removing the ring from the vagina.A health care provider's prescription is necessary.  Emergency contraception. Emergency contraceptives prevent pregnancy after unprotected sexual intercourse. This pill can be taken right after sex or up to 5 days after unprotected sex. It is most effective the sooner you take the pills after having sexual intercourse. Most emergency contraceptive pills are available without a prescription. Check with your pharmacist. Do not use emergency contraception as your only form of birth control. BARRIER METHODS   Female condom. This is a thin sheath (latex or rubber) that is worn over the penis during sexual intercourse. It can be used with spermicide to increase effectiveness.  Female condom. This is a soft, loose-fitting sheath that is put into the vagina before sexual intercourse.  Diaphragm. This is a soft, latex, dome-shaped barrier that must be fitted by a health care provider. It is inserted into the vagina, along with a spermicidal jelly. It is inserted before intercourse. The diaphragm should be left in the vagina for 6 to 8 hours after intercourse.  Cervical cap. This is a round, soft, latex or plastic cup that fits over the cervix and must be fitted by a health care provider. The cap can be left in place for up to 48 hours after intercourse.  Sponge. This is a soft, circular piece of polyurethane foam. The sponge has spermicide in it. It is inserted into the vagina after wetting it and before sexual intercourse.  Spermicides. These are chemicals that kill or block sperm from entering the cervix and uterus. They come in the form of  creams, jellies, suppositories, foam, or tablets. They do not require a prescription. They are inserted into the vagina with an applicator before having sexual intercourse. The process must be repeated every time you have sexual intercourse. INTRAUTERINE CONTRACEPTION  Intrauterine device (IUD). This is a T-shaped device that is put in a woman's uterus during a menstrual period to prevent pregnancy. There are 2 types:  Copper IUD. This type of IUD is wrapped in copper wire and is placed inside the uterus. Copper makes the uterus and fallopian tubes produce a fluid that kills sperm. It can stay in place for 10 years.  Hormone IUD. This type of IUD contains the hormone progestin (synthetic progesterone). The hormone thickens the cervical mucus and prevents sperm from   entering the uterus, and it also thins the uterine lining to prevent implantation of a fertilized egg. The hormone can weaken or kill the sperm that get into the uterus. It can stay in place for 3-5 years, depending on which type of IUD is used. PERMANENT METHODS OF CONTRACEPTION  Female tubal ligation. This is when the woman's fallopian tubes are surgically sealed, tied, or blocked to prevent the egg from traveling to the uterus.  Hysteroscopic sterilization. This involves placing a small coil or insert into each fallopian tube. Your doctor uses a technique called hysteroscopy to do the procedure. The device causes scar tissue to form. This results in permanent blockage of the fallopian tubes, so the sperm cannot fertilize the egg. It takes about 3 months after the procedure for the tubes to become blocked. You must use another form of birth control for these 3 months.  Female sterilization. This is when the female has the tubes that carry sperm tied off (vasectomy).This blocks sperm from entering the vagina during sexual intercourse. After the procedure, the man can still ejaculate fluid (semen). NATURAL PLANNING METHODS  Natural family  planning. This is not having sexual intercourse or using a barrier method (condom, diaphragm, cervical cap) on days the woman could become pregnant.  Calendar method. This is keeping track of the length of each menstrual cycle and identifying when you are fertile.  Ovulation method. This is avoiding sexual intercourse during ovulation.  Symptothermal method. This is avoiding sexual intercourse during ovulation, using a thermometer and ovulation symptoms.  Post-ovulation method. This is timing sexual intercourse after you have ovulated. Regardless of which type or method of contraception you choose, it is important that you use condoms to protect against the transmission of sexually transmitted infections (STIs). Talk with your health care provider about which form of contraception is most appropriate for you. Document Released: 03/25/2005 Document Revised: 03/30/2013 Document Reviewed: 09/17/2012 [employer] Patient Information 2015 [employer]. This information is not intended to replace advice given to you by your health care provider. Make sure you discuss any questions you have with your health care provider.  Breastfeeding Deciding to breastfeed is one of the best choices you can make for you and your baby. A change in hormones during pregnancy causes your breast tissue to grow and increases the number and size of your milk ducts. These hormones also allow proteins, sugars, and fats from your blood supply to make breast milk in your milk-producing glands. Hormones prevent breast milk from being released before your baby is born as well as prompt milk flow after birth. Once breastfeeding has begun, thoughts of your baby, as well as his or her sucking or crying, can stimulate the release of milk from your milk-producing glands.  BENEFITS OF BREASTFEEDING For Your Baby  Your first milk (colostrum) helps your baby's digestive system function better.   There are antibodies in your milk that  help your baby fight off infections.   Your baby has a lower incidence of asthma, allergies, and sudden infant death syndrome.   The nutrients in breast milk are better for your baby than infant formulas and are designed uniquely for your baby's needs.   Breast milk improves your baby's brain development.   Your baby is less likely to develop other conditions, such as childhood obesity, asthma, or type 2 diabetes mellitus.  For You   Breastfeeding helps to create a very special bond between you and your baby.   Breastfeeding is convenient. Breast milk is   always available at the correct temperature and costs nothing.   Breastfeeding helps to burn calories and helps you lose the weight gained during pregnancy.   Breastfeeding makes your uterus contract to its prepregnancy size faster and slows bleeding (lochia) after you give birth.   Breastfeeding helps to lower your risk of developing type 2 diabetes mellitus, osteoporosis, and breast or ovarian cancer later in life. SIGNS THAT YOUR BABY IS HUNGRY Early Signs of Hunger  Increased alertness or activity.  Stretching.  Movement of the head from side to side.  Movement of the head and opening of the mouth when the corner of the mouth or cheek is stroked (rooting).  Increased sucking sounds, smacking lips, cooing, sighing, or squeaking.  Hand-to-mouth movements.  Increased sucking of fingers or hands. Late Signs of Hunger  Fussing.  Intermittent crying. Extreme Signs of Hunger Signs of extreme hunger will require calming and consoling before your baby will be able to breastfeed successfully. Do not wait for the following signs of extreme hunger to occur before you initiate breastfeeding:   Restlessness.  A loud, strong cry.   Screaming. BREASTFEEDING BASICS Breastfeeding Initiation  Find a comfortable place to sit or lie down, with your neck and back well supported.  Place a pillow or rolled up blanket  under your baby to bring him or her to the level of your breast (if you are seated). Nursing pillows are specially designed to help support your arms and your baby while you breastfeed.  Make sure that your baby's abdomen is facing your abdomen.   Gently massage your breast. With your fingertips, massage from your chest wall toward your nipple in a circular motion. This encourages milk flow. You may need to continue this action during the feeding if your milk flows slowly.  Support your breast with 4 fingers underneath and your thumb above your nipple. Make sure your fingers are well away from your nipple and your baby's mouth.   Stroke your baby's lips gently with your finger or nipple.   When your baby's mouth is open wide enough, quickly bring your baby to your breast, placing your entire nipple and as much of the colored area around your nipple (areola) as possible into your baby's mouth.   More areola should be visible above your baby's upper lip than below the lower lip.   Your baby's tongue should be between his or her lower gum and your breast.   Ensure that your baby's mouth is correctly positioned around your nipple (latched). Your baby's lips should create a seal on your breast and be turned out (everted).  It is common for your baby to suck about 2-3 minutes in order to start the flow of breast milk. Latching Teaching your baby how to latch on to your breast properly is very important. An improper latch can cause nipple pain and decreased milk supply for you and poor weight gain in your baby. Also, if your baby is not latched onto your nipple properly, he or she may swallow some air during feeding. This can make your baby fussy. Burping your baby when you switch breasts during the feeding can help to get rid of the air. However, teaching your baby to latch on properly is still the best way to prevent fussiness from swallowing air while breastfeeding. Signs that your baby has  successfully latched on to your nipple:    Silent tugging or silent sucking, without causing you pain.   Swallowing heard between every 3-4   sucks.    Muscle movement above and in front of his or her ears while sucking.  Signs that your baby has not successfully latched on to nipple:   Sucking sounds or smacking sounds from your baby while breastfeeding.  Nipple pain. If you think your baby has not latched on correctly, slip your finger into the corner of your baby's mouth to break the suction and place it between your baby's gums. Attempt breastfeeding initiation again. Signs of Successful Breastfeeding Signs from your baby:   A gradual decrease in the number of sucks or complete cessation of sucking.   Falling asleep.   Relaxation of his or her body.   Retention of a small amount of milk in his or her mouth.   Letting go of your breast by himself or herself. Signs from you:  Breasts that have increased in firmness, weight, and size 1-3 hours after feeding.   Breasts that are softer immediately after breastfeeding.  Increased milk volume, as well as a change in milk consistency and color by the fifth day of breastfeeding.   Nipples that are not sore, cracked, or bleeding. Signs That Your Baby is Getting Enough Milk  Wetting at least 3 diapers in a 24-hour period. The urine should be clear and pale yellow by age 5 days.  At least 3 stools in a 24-hour period by age 5 days. The stool should be soft and yellow.  At least 3 stools in a 24-hour period by age 7 days. The stool should be seedy and yellow.  No loss of weight greater than 10% of birth weight during the first 3 days of age.  Average weight gain of 4-7 ounces (113-198 g) per week after age 4 days.  Consistent daily weight gain by age 5 days, without weight loss after the age of 2 weeks. After a feeding, your baby may spit up a small amount. This is common. BREASTFEEDING FREQUENCY AND DURATION Frequent  feeding will help you make more milk and can prevent sore nipples and breast engorgement. Breastfeed when you feel the need to reduce the fullness of your breasts or when your baby shows signs of hunger. This is called "breastfeeding on demand." Avoid introducing a pacifier to your baby while you are working to establish breastfeeding (the first 4-6 weeks after your baby is born). After this time you may choose to use a pacifier. Research has shown that pacifier use during the first year of a baby's life decreases the risk of sudden infant death syndrome (SIDS). Allow your baby to feed on each breast as long as he or she wants. Breastfeed until your baby is finished feeding. When your baby unlatches or falls asleep while feeding from the first breast, offer the second breast. Because newborns are often sleepy in the first few weeks of life, you may need to awaken your baby to get him or her to feed. Breastfeeding times will vary from baby to baby. However, the following rules can serve as a guide to help you ensure that your baby is properly fed:  Newborns (babies 4 weeks of age or younger) may breastfeed every 1-3 hours.  Newborns should not go longer than 3 hours during the day or 5 hours during the night without breastfeeding.  You should breastfeed your baby a minimum of 8 times in a 24-hour period until you begin to introduce solid foods to your baby at around 6 months of age. BREAST MILK PUMPING Pumping and storing breast milk allows   you to ensure that your baby is exclusively fed your breast milk, even at times when you are unable to breastfeed. This is especially important if you are going back to work while you are still breastfeeding or when you are not able to be present during feedings. Your lactation consultant can give you guidelines on how long it is safe to store breast milk.  A breast pump is a machine that allows you to pump milk from your breast into a sterile bottle. The pumped breast  milk can then be stored in a refrigerator or freezer. Some breast pumps are operated by hand, while others use electricity. Ask your lactation consultant which type will work best for you. Breast pumps can be purchased, but some hospitals and breastfeeding support groups lease breast pumps on a monthly basis. A lactation consultant can teach you how to hand express breast milk, if you prefer not to use a pump.  CARING FOR YOUR BREASTS WHILE YOU BREASTFEED Nipples can become dry, cracked, and sore while breastfeeding. The following recommendations can help keep your breasts moisturized and healthy:  Avoid using soap on your nipples.   Wear a supportive bra. Although not required, special nursing bras and tank tops are designed to allow access to your breasts for breastfeeding without taking off your entire bra or top. Avoid wearing underwire-style bras or extremely tight bras.  Air dry your nipples for 3-4minutes after each feeding.   Use only cotton bra pads to absorb leaked breast milk. Leaking of breast milk between feedings is normal.   Use lanolin on your nipples after breastfeeding. Lanolin helps to maintain your skin's normal moisture barrier. If you use pure lanolin, you do not need to wash it off before feeding your baby again. Pure lanolin is not toxic to your baby. You may also hand express a few drops of breast milk and gently massage that milk into your nipples and allow the milk to air dry. In the first few weeks after giving birth, some women experience extremely full breasts (engorgement). Engorgement can make your breasts feel heavy, warm, and tender to the touch. Engorgement peaks within 3-5 days after you give birth. The following recommendations can help ease engorgement:  Completely empty your breasts while breastfeeding or pumping. You may want to start by applying warm, moist heat (in the shower or with warm water-soaked hand towels) just before feeding or pumping. This  increases circulation and helps the milk flow. If your baby does not completely empty your breasts while breastfeeding, pump any extra milk after he or she is finished.  Wear a snug bra (nursing or regular) or tank top for 1-2 days to signal your body to slightly decrease milk production.  Apply ice packs to your breasts, unless this is too uncomfortable for you.  Make sure that your baby is latched on and positioned properly while breastfeeding. If engorgement persists after 48 hours of following these recommendations, contact your health care provider or a lactation consultant. OVERALL HEALTH CARE RECOMMENDATIONS WHILE BREASTFEEDING  Eat healthy foods. Alternate between meals and snacks, eating 3 of each per day. Because what you eat affects your breast milk, some of the foods may make your baby more irritable than usual. Avoid eating these foods if you are sure that they are negatively affecting your baby.  Drink milk, fruit juice, and water to satisfy your thirst (about 10 glasses a day).   Rest often, relax, and continue to take your prenatal vitamins to prevent fatigue,   stress, and anemia.  Continue breast self-awareness checks.  Avoid chewing and smoking tobacco.  Avoid alcohol and drug use. Some medicines that may be harmful to your baby can pass through breast milk. It is important to ask your health care provider before taking any medicine, including all over-the-counter and prescription medicine as well as vitamin and herbal supplements. It is possible to become pregnant while breastfeeding. If birth control is desired, ask your health care provider about options that will be safe for your baby. SEEK MEDICAL CARE IF:   You feel like you want to stop breastfeeding or have become frustrated with breastfeeding.  You have painful breasts or nipples.  Your nipples are cracked or bleeding.  Your breasts are red, tender, or warm.  You have a swollen area on either breast.  You  have a fever or chills.  You have nausea or vomiting.  You have drainage other than breast milk from your nipples.  Your breasts do not become full before feedings by the fifth day after you give birth.  You feel sad and depressed.  Your baby is too sleepy to eat well.  Your baby is having trouble sleeping.   Your baby is wetting less than 3 diapers in a 24-hour period.  Your baby has less than 3 stools in a 24-hour period.  Your baby's skin or the white part of his or her eyes becomes yellow.   Your baby is not gaining weight by 5 days of age. SEEK IMMEDIATE MEDICAL CARE IF:   Your baby is overly tired (lethargic) and does not want to wake up and feed.  Your baby develops an unexplained fever. Document Released: 03/25/2005 Document Revised: 03/30/2013 Document Reviewed: 09/16/2012 [employer] Patient Information 2015 [employer]. This information is not intended to replace advice given to you by your health care provider. Make sure you discuss any questions you have with your health care provider.  

## 2014-10-24 NOTE — Progress Notes (Signed)
   Subjective:    Julia PrudeKeiya Bulger is a G2P0010 438w1d being seen today for her first obstetrical visit.  Her obstetrical history is significant for obesity and smoker. [facility] but was never on any medications. Substance abuse- marijuana and cocaine. Current smoker of 10 cigarettes per day and chlamydia infection in the first trimester.  Patient with ? H/o TIA. [place], patient had a near syncopal episodes secondary to overexhaustion and dehydration. She was never hospitalized or evaluated by a neurologist. Patient does intend to breast feed. Pregnancy history fully reviewed.  Patient reports numbness in her arms and legs, daily.  Filed Vitals:   10/24/14 0951 10/24/14 0955  BP: 105/88   Pulse: 82   Temp: 98 F (36.7 C)   Height:  5\' 2"  (1.575 m)  Weight: 234 lb 12.8 oz (106.505 kg)     HISTORY: OB History  Gravida Para Term Preterm AB SAB TAB Ectopic Multiple Living  2    1 1         # Outcome Date GA Lbr Len/2nd Weight Sex Delivery Anes PTL Lv  2 Current           1 SAB              Past Medical History  Diagnosis Date  . Hypertension   . Stroke     2014  . Asthma     exercise induced  . Depression    Past Surgical History  Procedure Laterality Date  . Wisdom tooth extraction    . Dilation and curettage of uterus     No family history on file.   Exam     Neurologic: oriented, gait normal; reflexes normal and symmetric, no focal deficits   Extremities: normal strength, tone, and muscle mass      Assessment:    Pregnancy: G2P0010 Patient Active Problem List   Diagnosis Date Noted  . Supervision of high risk pregnancy, antepartum 10/24/2014  . Hypertension 10/24/2014  . Hypertension affecting pregnancy, antepartum 10/24/2014  . Substance abuse affecting pregnancy, antepartum 10/24/2014  . Tobacco smoking affecting pregnancy, antepartum 10/24/2014  . Obesity (BMI 30-39.9) 10/24/2014  . Obesity affecting pregnancy in  third trimester 10/24/2014  . Chlamydia infection affecting pregnancy, antepartum 10/24/2014        Plan:     Initial labs drawn. Prenatal vitamins. Problem list reviewed and updated. Genetic Screening discussed First Screen: normal.  Ultrasound discussed; fetal survey: ordered. Neurology consult to evaluate numbness Smoking cessation instructions provided Baseline labs and [facility] She plans Nexplanon for contraception  Follow up in 4 weeks. 50% of 30 min visit spent on counseling and coordination of care.     Aidin Doane 10/24/2014

## 2014-10-27 LAB — COMPREHENSIVE METABOLIC PANEL
ALT: 19 U/L (ref 0–35)
AST: 13 U/L (ref 0–37)
Albumin: 3.6 g/dL (ref 3.5–5.2)
Alkaline Phosphatase: 62 U/L (ref 39–117)
BUN: 7 mg/dL (ref 6–23)
CO2: 19 mEq/L (ref 19–32)
Calcium: 9.3 mg/dL (ref 8.4–10.5)
Chloride: 104 mEq/L (ref 96–112)
Creat: 0.5 mg/dL (ref 0.50–1.10)
Glucose, Bld: 87 mg/dL (ref 70–99)
Potassium: 4 mEq/L (ref 3.5–5.3)
Sodium: 136 mEq/L (ref 135–145)
Total Bilirubin: 0.2 mg/dL (ref 0.2–1.2)
Total Protein: 6.7 g/dL (ref 6.0–8.3)

## 2014-10-27 LAB — CBC
HCT: 36.4 % (ref 36.0–46.0)
Hemoglobin: 12.5 g/dL (ref 12.0–15.0)
MCH: 28.9 pg (ref 26.0–34.0)
MCHC: 34.3 g/dL (ref 30.0–36.0)
MCV: 84.3 fL (ref 78.0–100.0)
MPV: 9.4 fL (ref 8.6–12.4)
Platelets: 346 10*3/uL (ref 150–400)
RBC: 4.32 MIL/uL (ref 3.87–5.11)
RDW: 13.4 % (ref 11.5–15.5)
WBC: 9.4 10*3/uL (ref 4.0–10.5)

## 2014-10-28 LAB — CANNABANOIDS (GC/LC/MS), URINE: THC-COOH (GC/LC/MS), ur confirm: 100 ng/mL — AB (ref <5)

## 2014-10-29 LAB — PRESCRIPTION MONITORING PROFILE (19 PANEL)
Amphetamine/Meth: NEGATIVE ng/mL
Barbiturate Screen, Urine: NEGATIVE ng/mL
Benzodiazepine Screen, Urine: NEGATIVE ng/mL
Buprenorphine, Urine: NEGATIVE ng/mL
Carisoprodol, Urine: NEGATIVE ng/mL
Cocaine Metabolites: NEGATIVE ng/mL
Creatinine, Urine: 401.99 mg/dL (ref >20.0)
Fentanyl, Ur: NEGATIVE ng/mL
MDMA URINE: NEGATIVE ng/mL
Meperidine, Ur: NEGATIVE ng/mL
Methadone Screen, Urine: NEGATIVE ng/mL
Methaqualone: NEGATIVE ng/mL
Nitrites, Initial: NEGATIVE ug/mL
Opiate Screen, Urine: NEGATIVE ng/mL
Oxycodone Screen, Ur: NEGATIVE ng/mL
Phencyclidine, Ur: NEGATIVE ng/mL
Propoxyphene: NEGATIVE ng/mL
Tapentadol, urine: NEGATIVE ng/mL
Tramadol Scrn, Ur: NEGATIVE ng/mL
Zolpidem, Urine: NEGATIVE ng/mL
pH, Initial: 5.9 pH (ref 4.5–8.9)

## 2014-10-31 LAB — PROTEIN, URINE, 24 HOUR
Protein, 24H Urine: 117 mg/d (ref <150)
Protein, Urine: 9 mg/dL (ref 5–24)

## 2014-11-07 DIAGNOSIS — R2990 Unspecified symptoms and signs involving the nervous system: Secondary | ICD-10-CM | POA: Insufficient documentation

## 2014-11-07 DIAGNOSIS — Z8673 Personal history of transient ischemic attack (TIA), and cerebral infarction without residual deficits: Secondary | ICD-10-CM

## 2014-11-11 ENCOUNTER — Encounter: Payer: Self-pay | Admitting: *Deleted

## 2014-11-15 ENCOUNTER — Other Ambulatory Visit (HOSPITAL_COMMUNITY): Payer: Self-pay | Admitting: Nurse Practitioner

## 2014-11-15 ENCOUNTER — Telehealth: Payer: Self-pay | Admitting: *Deleted

## 2014-11-15 ENCOUNTER — Encounter: Payer: Self-pay | Admitting: Diagnostic Neuroimaging

## 2014-11-15 ENCOUNTER — Ambulatory Visit (INDEPENDENT_AMBULATORY_CARE_PROVIDER_SITE_OTHER): Payer: Medicaid Other | Admitting: Diagnostic Neuroimaging

## 2014-11-15 VITALS — BP 115/68 | HR 84 | Ht 62.0 in | Wt 238.2 lb

## 2014-11-15 DIAGNOSIS — R200 Anesthesia of skin: Secondary | ICD-10-CM

## 2014-11-15 DIAGNOSIS — O10912 Unspecified pre-existing hypertension complicating pregnancy, second trimester: Secondary | ICD-10-CM

## 2014-11-15 DIAGNOSIS — Z3A18 18 weeks gestation of pregnancy: Secondary | ICD-10-CM

## 2014-11-15 DIAGNOSIS — Z8673 Personal history of transient ischemic attack (TIA), and cerebral infarction without residual deficits: Secondary | ICD-10-CM

## 2014-11-15 NOTE — Progress Notes (Signed)
[facility]  PATIENT: Julia Hopkins DOB: 22-Apr-1990  REFERRING CLINICIAN: P Constant MD HISTORY FROM: patient and boyfriend  REASON FOR VISIT: new consult    HISTORICAL  CHIEF COMPLAINT:  Chief Complaint  Patient presents with  . Numbness    rm 7, New patient, "boyfriendChrissie Noa, bilat leg/arm numbness    HISTORY OF PRESENT ILLNESS:   24 year old G2 P1 female, [redacted] weeks pregnant, here for evaluation of numbness and tingling. Patient having intermittent numbness and tingling in her fingers, hands, sometimes in her feet and legs, depending on her body position. These typically affect her when she is sleeping and wake her up out of sleep. Some times if she is leaning with her arm on the table or chair, her hand will go numb. Sometimes if she is laying on one side, the same side leg may go numb" fall asleep". Patient has gained 16-18 pounds of weight so far in pregnancy. Patient had similar but milder and more rare symptoms in the past before pregnancy.  No weakness. No vision changes. No headache. No accidents or trauma.  Patient had episode of passing out in 2014, thought to be related to dehydration and exhaustion. Her discharge paperwork from the hospital apparently had something about watching out for "stroke symptoms" but it is not clear if she was officially diagnosed with a stroke at that time.   REVIEW OF SYSTEMS: Full 14 system review of systems performed and notable only for shortness of breath constipation numbness dizziness.  ALLERGIES: No Known Allergies  HOME MEDICATIONS: Outpatient Prescriptions Prior to Visit  Medication Sig Dispense Refill  . docusate sodium (COLACE) 250 MG capsule Take 1 capsule (250 mg total) by mouth 2 (two) times daily. 60 capsule 11  . Prenatal Vit-Fe Fumarate-FA (PRENATAL VITAMIN PO) Take by mouth.    Marland Kitchen aspirin EC 81 MG tablet Take 1 tablet (81 mg total) by mouth daily. (Patient not taking: Reported on 11/15/2014) 30 tablet  7   No facility-administered medications prior to visit.    PAST MEDICAL HISTORY: Past Medical History  Diagnosis Date  . Hypertension   . Asthma     exercise induced  . Depression   . Stroke     2014, "dehydration, exhaustion"    PAST SURGICAL HISTORY: Past Surgical History  Procedure Laterality Date  . Wisdom tooth extraction      x 4  . Dilation and curettage of uterus      x 1    FAMILY HISTORY: Family History  Problem Relation Age of Onset  . Healthy Sister     SOCIAL HISTORY:  History   Social History  . Marital Status: Single    Spouse Name: Willliam  . Number of Children: 0  . Years of Education: 14   Occupational History  .      not employed   Social History Main Topics  . Smoking status: Current Every Day Smoker -- 0.25 packs/day for 11 years    Types: Cigarettes  . Smokeless tobacco: Not on file  . Alcohol Use: No  . Drug Use: Yes    Special: Marijuana, Cocaine     Comment: Last marijuana 09/11/14 last cocaine used 04/2014, last marijuana mid July '16  . Sexual Activity: Yes   Other Topics Concern  . Not on file   Social History Narrative   Lives with boyfriend, in home   Caffeine use- soda occcas     PHYSICAL EXAM   GENERAL EXAM/CONSTITUTIONAL: Vitals:  Filed  Vitals:   11/15/14 1141  BP: 115/68  Pulse: 84  Height:  (1.575 m)  Weight: 238 lb 3.2 oz (108.047 kg)     Body mass index is 43.56 kg/(m^2).  Visual Acuity Screening   Right eye Left eye Both eyes  Without correction: 20/30 20/20   With correction:        Patient is in no distress; well developed, nourished and groomed; neck is supple  CARDIOVASCULAR:  Examination of carotid arteries is normal; no carotid bruits  Regular rate and rhythm, no murmurs  Examination of peripheral vascular system by observation and palpation is normal  EYES:  Ophthalmoscopic exam of optic discs and posterior segments is normal; no papilledema or  hemorrhages  MUSCULOSKELETAL:  Gait, strength, tone, movements noted in Neurologic exam below  NEUROLOGIC: MENTAL STATUS:  No flowsheet data found.  awake, alert, oriented to person, place and time  recent and remote memory intact  normal attention and concentration  language fluent, comprehension intact, naming intact,   fund of knowledge appropriate  CRANIAL NERVE:   2nd - no papilledema on fundoscopic exam  2nd, 3rd, 4th, 6th - pupils equal and reactive to light, visual fields full to confrontation, extraocular muscles intact, no nystagmus  5th - facial sensation symmetric  7th - facial strength symmetric  8th - hearing intact  9th - palate elevates symmetrically, uvula midline  11th - shoulder shrug symmetric  12th - tongue protrusion midline  MOTOR:   normal bulk and tone, full strength in the BUE, BLE  SENSORY:   normal and symmetric to light touch, pinprick, temperature, vibration; POSITIVE PHALENS SIGN IN HANDS; DECR VIB AND TEMP IN RIGHT FOOT  COORDINATION:   finger-nose-finger, fine finger movements normal  REFLEXES:   deep tendon reflexes TRACE and symmetric  GAIT/STATION:   narrow based gait; able to walk tandem; romberg is negative    DIAGNOSTIC DATA (LABS, IMAGING, TESTING) - I reviewed patient records, labs, notes, testing and imaging myself where available.  Lab Results  Component Value Date   WBC 9.4 10/27/2014   HGB 12.5 10/27/2014   HCT 36.4 10/27/2014   MCV 84.3 10/27/2014   PLT 346 10/27/2014      Component Value Date/Time   NA 136 10/27/2014 1547   K 4.0 10/27/2014 1547   CL 104 10/27/2014 1547   CO2 19 10/27/2014 1547   GLUCOSE 87 10/27/2014 1547   BUN 7 10/27/2014 1547   CREATININE 0.50 10/27/2014 1547   CREATININE 0.73 09/02/2014 1622   CALCIUM 9.3 10/27/2014 1547   PROT 6.7 10/27/2014 1547   ALBUMIN 3.6 10/27/2014 1547   AST 13 10/27/2014 1547   ALT 19 10/27/2014 1547   ALKPHOS 62 10/27/2014 1547    BILITOT 0.2 10/27/2014 1547   GFRNONAA >60 09/02/2014 1622   GFRAA >60 09/02/2014 1622   No results found for: CHOL, HDL, LDLCALC, LDLDIRECT, TRIG, CHOLHDL No results found for: ZOXW9U No results found for: VITAMINB12 No results found for: TSH   09/02/14 EKG - normal sinus rhythm [I reviewed images myself and agree with interpretation. -VRP]      ASSESSMENT AND PLAN  24 y.o. year old female here with intermittent numbness and tingling in hands and feet, worse since pregnancy, likely represent intermittent peripheral nerve compression. Neurologic exam unremarkable except for decreased sensation right foot, which raise possibility of underlying lumbar radiculopathy. Advised patient on monitoring her hand and arm and leg positioning and try to avoid common compression sites at wrist, elbows,  knees and ankles. No further electrodiagnostic reimaging testing advised at this time. These types of peripheral nerve compressions can be common during pregnancy with changes in weight and fluid status. Reassured patient. We'll check lab testing to rule out other causes of peripheral neuropathy. Will call patient with results. Offered follow up appt, but she will see how things progress and follow up as needed.  Dx: Numbness - Plan: Vitamin B12, TSH, Hemoglobin A1c    PLAN:  Orders Placed This Encounter  Procedures  . Vitamin B12  . TSH  . Hemoglobin A1c   Return if symptoms worsen or fail to improve.    Suanne Marker, MD 11/15/2014, 2:05 PM Certified in Neurology, Neurophysiology and Neuroimaging  [facility] [address], [address] [place], [place] [address] [phone]

## 2014-11-15 NOTE — Telephone Encounter (Signed)
Per Dr Lucia Gaskins patient did not stay for labs today. Spoke with patient who states she was unable to wait due to her ride. Requested she come in for her labs, gave her office lab hours and advised she check in at front desk. She stated she would come this week for labs. She verbalized understanding, appreciation.

## 2014-11-21 ENCOUNTER — Encounter: Payer: Self-pay | Admitting: Family Medicine

## 2014-11-23 ENCOUNTER — Telehealth: Payer: Self-pay | Admitting: *Deleted

## 2014-11-23 NOTE — Telephone Encounter (Signed)
-----   Message from Suanne Marker, MD sent at 11/23/2014  1:02 PM EDT ----- Regarding: RE: labs Have patient go to PCP or ob/gyn for labs. -VRP  ----- Message -----    From: [place] Pink, RN    Sent: 11/23/2014  11:30 AM      To: Suanne Marker, MD Subject: labs                                           Dr Marjory Lies,  She never had her labs done on 11/15/14, left early the day you ordered them. I called her, and she said she'd come back that week ,  but I noticed she has not.  Cheryl in lab says orders are still good to go if you want them done.  Do you want me to call her back?   Thank you, [facility]

## 2014-11-23 NOTE — Telephone Encounter (Signed)
Left detailed vm re: go to PCP or OB_Gyn to have labs drawn: Vit B12, TSH, Hgb A1c. Repeated these directions and left this caller's name, number for questions.

## 2014-11-24 ENCOUNTER — Encounter: Payer: Self-pay | Admitting: Obstetrics & Gynecology

## 2014-11-24 ENCOUNTER — Encounter (HOSPITAL_COMMUNITY): Payer: Self-pay

## 2014-11-24 ENCOUNTER — Ambulatory Visit (HOSPITAL_COMMUNITY)
Admission: RE | Admit: 2014-11-24 | Discharge: 2014-11-24 | Disposition: A | Discharge status: Home / Self Care | Payer: Medicaid Other | Source: Ambulatory Visit | Attending: Family Medicine | Admitting: Family Medicine

## 2014-11-24 ENCOUNTER — Other Ambulatory Visit (HOSPITAL_COMMUNITY): Payer: Self-pay | Admitting: Nurse Practitioner

## 2014-11-24 VITALS — BP 122/74 | HR 91 | Wt 242.2 lb

## 2014-11-24 DIAGNOSIS — F1990 Other psychoactive substance use, unspecified, uncomplicated: Secondary | ICD-10-CM | POA: Insufficient documentation

## 2014-11-24 DIAGNOSIS — O2690 Pregnancy related conditions, unspecified, unspecified trimester: Secondary | ICD-10-CM | POA: Diagnosis not present

## 2014-11-24 DIAGNOSIS — Z8673 Personal history of transient ischemic attack (TIA), and cerebral infarction without residual deficits: Secondary | ICD-10-CM

## 2014-11-24 DIAGNOSIS — F1910 Other psychoactive substance abuse, uncomplicated: Secondary | ICD-10-CM

## 2014-11-24 DIAGNOSIS — O99332 Smoking (tobacco) complicating pregnancy, second trimester: Secondary | ICD-10-CM

## 2014-11-24 DIAGNOSIS — O99322 Drug use complicating pregnancy, second trimester: Secondary | ICD-10-CM

## 2014-11-24 DIAGNOSIS — Z0489 Encounter for examination and observation for other specified reasons: Secondary | ICD-10-CM

## 2014-11-24 DIAGNOSIS — O2692 Pregnancy related conditions, unspecified, second trimester: Secondary | ICD-10-CM

## 2014-11-24 DIAGNOSIS — O10919 Unspecified pre-existing hypertension complicating pregnancy, unspecified trimester: Secondary | ICD-10-CM

## 2014-11-24 DIAGNOSIS — E669 Obesity, unspecified: Secondary | ICD-10-CM | POA: Diagnosis not present

## 2014-11-24 DIAGNOSIS — O10019 Pre-existing essential hypertension complicating pregnancy, unspecified trimester: Secondary | ICD-10-CM | POA: Diagnosis present

## 2014-11-24 DIAGNOSIS — O99212 Obesity complicating pregnancy, second trimester: Secondary | ICD-10-CM | POA: Diagnosis not present

## 2014-11-24 DIAGNOSIS — Z36 Encounter for antenatal screening of mother: Secondary | ICD-10-CM | POA: Insufficient documentation

## 2014-11-24 DIAGNOSIS — Z3A18 18 weeks gestation of pregnancy: Secondary | ICD-10-CM

## 2014-11-24 DIAGNOSIS — IMO0002 Reserved for concepts with insufficient information to code with codable children: Secondary | ICD-10-CM

## 2014-11-24 DIAGNOSIS — O10912 Unspecified pre-existing hypertension complicating pregnancy, second trimester: Secondary | ICD-10-CM

## 2014-12-08 ENCOUNTER — Encounter: Payer: Medicaid Other | Admitting: Family Medicine

## 2014-12-08 ENCOUNTER — Encounter: Payer: Self-pay | Admitting: Family Medicine

## 2014-12-15 ENCOUNTER — Ambulatory Visit (HOSPITAL_COMMUNITY)
Admission: RE | Admit: 2014-12-15 | Discharge: 2014-12-15 | Disposition: A | Discharge status: Home / Self Care | Payer: Medicaid Other | Source: Ambulatory Visit | Attending: Family | Admitting: Family

## 2014-12-15 ENCOUNTER — Other Ambulatory Visit (HOSPITAL_COMMUNITY): Payer: Self-pay | Admitting: Maternal and Fetal Medicine

## 2014-12-15 ENCOUNTER — Ambulatory Visit (INDEPENDENT_AMBULATORY_CARE_PROVIDER_SITE_OTHER): Payer: Medicaid Other | Admitting: Family

## 2014-12-15 ENCOUNTER — Encounter (HOSPITAL_COMMUNITY): Payer: Self-pay

## 2014-12-15 ENCOUNTER — Other Ambulatory Visit (HOSPITAL_COMMUNITY)
Admission: RE | Admit: 2014-12-15 | Discharge: 2014-12-15 | Disposition: A | Discharge status: Home / Self Care | Payer: Medicaid Other | Source: Ambulatory Visit | Attending: Family | Admitting: Family

## 2014-12-15 VITALS — BP 122/67 | HR 92

## 2014-12-15 VITALS — BP 127/63 | HR 94 | Temp 98.5°F | Wt 242.2 lb

## 2014-12-15 DIAGNOSIS — O10019 Pre-existing essential hypertension complicating pregnancy, unspecified trimester: Secondary | ICD-10-CM

## 2014-12-15 DIAGNOSIS — O2692 Pregnancy related conditions, unspecified, second trimester: Secondary | ICD-10-CM

## 2014-12-15 DIAGNOSIS — K5900 Constipation, unspecified: Secondary | ICD-10-CM

## 2014-12-15 DIAGNOSIS — O99212 Obesity complicating pregnancy, second trimester: Secondary | ICD-10-CM

## 2014-12-15 DIAGNOSIS — Z0489 Encounter for examination and observation for other specified reasons: Secondary | ICD-10-CM

## 2014-12-15 DIAGNOSIS — O99332 Smoking (tobacco) complicating pregnancy, second trimester: Secondary | ICD-10-CM | POA: Diagnosis not present

## 2014-12-15 DIAGNOSIS — IMO0002 Reserved for concepts with insufficient information to code with codable children: Secondary | ICD-10-CM

## 2014-12-15 DIAGNOSIS — O0992 Supervision of high risk pregnancy, unspecified, second trimester: Secondary | ICD-10-CM | POA: Diagnosis not present

## 2014-12-15 DIAGNOSIS — O169 Unspecified maternal hypertension, unspecified trimester: Secondary | ICD-10-CM

## 2014-12-15 DIAGNOSIS — Z113 Encounter for screening for infections with a predominantly sexual mode of transmission: Secondary | ICD-10-CM | POA: Insufficient documentation

## 2014-12-15 DIAGNOSIS — O98812 Other maternal infectious and parasitic diseases complicating pregnancy, second trimester: Secondary | ICD-10-CM

## 2014-12-15 DIAGNOSIS — Z3A21 21 weeks gestation of pregnancy: Secondary | ICD-10-CM | POA: Diagnosis not present

## 2014-12-15 DIAGNOSIS — O10012 Pre-existing essential hypertension complicating pregnancy, second trimester: Secondary | ICD-10-CM | POA: Insufficient documentation

## 2014-12-15 DIAGNOSIS — O162 Unspecified maternal hypertension, second trimester: Secondary | ICD-10-CM | POA: Diagnosis present

## 2014-12-15 DIAGNOSIS — A749 Chlamydial infection, unspecified: Secondary | ICD-10-CM

## 2014-12-15 DIAGNOSIS — Z36 Encounter for antenatal screening of mother: Secondary | ICD-10-CM | POA: Insufficient documentation

## 2014-12-15 DIAGNOSIS — O98312 Other infections with a predominantly sexual mode of transmission complicating pregnancy, second trimester: Secondary | ICD-10-CM

## 2014-12-15 DIAGNOSIS — E669 Obesity, unspecified: Secondary | ICD-10-CM | POA: Insufficient documentation

## 2014-12-15 LAB — POCT URINALYSIS DIP (DEVICE)
Bilirubin Urine: NEGATIVE
Glucose, UA: NEGATIVE mg/dL
Hgb urine dipstick: NEGATIVE
Ketones, ur: NEGATIVE mg/dL
Nitrite: NEGATIVE
Protein, ur: NEGATIVE mg/dL
Specific Gravity, Urine: 1.025 (ref 1.005–1.030)
Urobilinogen, UA: 0.2 mg/dL (ref 0.0–1.0)
pH: 7 (ref 5.0–8.0)

## 2014-12-15 MED ORDER — DOCUSATE SODIUM 250 MG PO CAPS: 250.0000 mg | ORAL_CAPSULE | Freq: Two times a day (BID) | ORAL | Status: DC

## 2014-12-15 MED ORDER — CONCEPT DHA 53.5-38-1 MG PO CAPS: 1.0000 | ORAL_CAPSULE | Freq: Every day | ORAL | Status: DC

## 2014-12-15 MED ORDER — ALBUTEROL SULFATE HFA 108 (90 BASE) MCG/ACT IN AERS
2.0000 | INHALATION_SPRAY | Freq: Four times a day (QID) | RESPIRATORY_TRACT | Status: DC | PRN
Start: 2014-12-15 — End: 2016-04-20

## 2014-12-15 MED ORDER — FLUTICASONE-SALMETEROL 100-50 MCG/DOSE IN AEPB: 1.0000 | INHALATION_SPRAY | Freq: Two times a day (BID) | RESPIRATORY_TRACT | Status: DC

## 2014-12-15 NOTE — Progress Notes (Signed)
Subjective:  Julia Hopkins is a 24 y.o. G2P0010 at [redacted]w[redacted]d being seen today for ongoing prenatal care.  Patient reports congestion; feels like a "cold".  Contractions: Not present.  Vag. Bleeding: None. Movement: Present. Denies leaking of fluid.   The following portions of the patient's history were reviewed and updated as appropriate: allergies, current medications, past family history, past medical history, past social history, past surgical history and problem list.   Objective:   Filed Vitals:   12/15/14 0951  BP: 127/63  Pulse: 94  Temp: 98.5 F (36.9 C)  Weight: 242 lb 3.2 oz (109.861 kg)    Fetal Status: Fetal Heart Rate (bpm): 146   Movement: Present     General:  Alert, oriented and cooperative. Patient is in no acute distress.  Skin: Skin is warm and dry. No rash noted.   Cardiovascular: Normal heart rate noted  Respiratory: Normal respiratory effort, no problems with respiration noted  Abdomen: Soft, gravid, appropriate for gestational age. Pain/Pressure: Absent     Pelvic: Vag. Bleeding: None Vag D/C Character: White   Cervical exam deferred        Extremities: Normal range of motion.  Edema: None  Mental Status: Normal mood and affect. Normal behavior. Normal judgment and thought content.   Urinalysis: Urine Protein: Negative Urine Glucose: Negative  Assessment and Plan:  Pregnancy: G2P0010 at [redacted]w[redacted]d  1. Hypertension affecting pregnancy, antepartum, second trimester - Continue monitoring  2. Constipation, unspecified constipation type - docusate sodium (COLACE) 250 MG capsule; Take 1 capsule (250 mg total) by mouth 2 (two) times daily.  Dispense: 60 capsule; Refill: 11  3. Chlamydia infection complicating pregnancy, second trimester - GC/Chlamydia Probe Amp TOC today  4. Supervision of high risk pregnancy, antepartum, second trimester - RPR - HIV antibody  5.  Asthma - Refill albuterol and add Advair discus (per consultation with Dr. Macon Large)  Preterm labor  symptoms and general obstetric precautions including but not limited to vaginal bleeding, contractions, leaking of fluid and fetal movement were reviewed in detail with the patient. Please refer to After Visit Summary for other counseling recommendations.  Return in about 3 weeks (around 01/05/2015).   Eino Farber Kennith Gain, CNM

## 2014-12-15 NOTE — Progress Notes (Signed)
Pt needs order for prenatal vitamin and 2 inhalers Pt refused flu vaccine today due to severe cold Breastfeeding tip of the week reviewed today

## 2014-12-16 LAB — GC/CHLAMYDIA PROBE AMP (~~LOC~~) NOT AT ARMC
Chlamydia: NEGATIVE
Neisseria Gonorrhea: NEGATIVE

## 2014-12-16 LAB — HIV ANTIBODY (ROUTINE TESTING W REFLEX): HIV 1&2 Ab, 4th Generation: NONREACTIVE

## 2014-12-16 LAB — RPR

## 2014-12-24 ENCOUNTER — Encounter (HOSPITAL_COMMUNITY): Payer: Self-pay | Admitting: Nurse Practitioner

## 2014-12-24 ENCOUNTER — Emergency Department (HOSPITAL_COMMUNITY): Payer: Medicaid Other

## 2014-12-24 ENCOUNTER — Emergency Department (HOSPITAL_COMMUNITY)
Admission: EM | Admit: 2014-12-24 | Discharge: 2014-12-24 | Disposition: A | Discharge status: Home / Self Care | Payer: Medicaid Other | Attending: Emergency Medicine | Admitting: Emergency Medicine

## 2014-12-24 DIAGNOSIS — Z7982 Long term (current) use of aspirin: Secondary | ICD-10-CM | POA: Diagnosis not present

## 2014-12-24 DIAGNOSIS — R109 Unspecified abdominal pain: Secondary | ICD-10-CM | POA: Insufficient documentation

## 2014-12-24 DIAGNOSIS — R059 Cough, unspecified: Secondary | ICD-10-CM

## 2014-12-24 DIAGNOSIS — Z79899 Other long term (current) drug therapy: Secondary | ICD-10-CM | POA: Insufficient documentation

## 2014-12-24 DIAGNOSIS — Z8673 Personal history of transient ischemic attack (TIA), and cerebral infarction without residual deficits: Secondary | ICD-10-CM | POA: Insufficient documentation

## 2014-12-24 DIAGNOSIS — I10 Essential (primary) hypertension: Secondary | ICD-10-CM | POA: Diagnosis not present

## 2014-12-24 DIAGNOSIS — R05 Cough: Secondary | ICD-10-CM

## 2014-12-24 DIAGNOSIS — F329 Major depressive disorder, single episode, unspecified: Secondary | ICD-10-CM | POA: Insufficient documentation

## 2014-12-24 DIAGNOSIS — Z720 Tobacco use: Secondary | ICD-10-CM | POA: Diagnosis not present

## 2014-12-24 DIAGNOSIS — J45901 Unspecified asthma with (acute) exacerbation: Secondary | ICD-10-CM | POA: Diagnosis not present

## 2014-12-24 LAB — CBC WITH DIFFERENTIAL/PLATELET
Basophils Absolute: 0 10*3/uL (ref 0.0–0.1)
Basophils Relative: 0 %
Eosinophils Absolute: 0.2 10*3/uL (ref 0.0–0.7)
Eosinophils Relative: 1 %
HCT: 32.6 % — ABNORMAL LOW (ref 36.0–46.0)
Hemoglobin: 11.1 g/dL — ABNORMAL LOW (ref 12.0–15.0)
Lymphocytes Relative: 14 %
Lymphs Abs: 1.9 10*3/uL (ref 0.7–4.0)
MCH: 29.3 pg (ref 26.0–34.0)
MCHC: 34 g/dL (ref 30.0–36.0)
MCV: 86 fL (ref 78.0–100.0)
Monocytes Absolute: 1 10*3/uL (ref 0.1–1.0)
Monocytes Relative: 8 %
Neutro Abs: 10.2 10*3/uL — ABNORMAL HIGH (ref 1.7–7.7)
Neutrophils Relative %: 77 %
Platelets: 323 10*3/uL (ref 150–400)
RBC: 3.79 MIL/uL — ABNORMAL LOW (ref 3.87–5.11)
RDW: 13.3 % (ref 11.5–15.5)
WBC: 13.3 10*3/uL — ABNORMAL HIGH (ref 4.0–10.5)

## 2014-12-24 LAB — URINALYSIS, ROUTINE W REFLEX MICROSCOPIC
Bilirubin Urine: NEGATIVE
Glucose, UA: NEGATIVE mg/dL
Hgb urine dipstick: NEGATIVE
Ketones, ur: 15 mg/dL — AB
Nitrite: NEGATIVE
Protein, ur: NEGATIVE mg/dL
Specific Gravity, Urine: 1.034 — ABNORMAL HIGH (ref 1.005–1.030)
Urobilinogen, UA: 1 mg/dL (ref 0.0–1.0)
pH: 6 (ref 5.0–8.0)

## 2014-12-24 LAB — COMPREHENSIVE METABOLIC PANEL
ALT: 15 U/L (ref 14–54)
AST: 16 U/L (ref 15–41)
Albumin: 3 g/dL — ABNORMAL LOW (ref 3.5–5.0)
Alkaline Phosphatase: 76 U/L (ref 38–126)
Anion gap: 9 (ref 5–15)
BUN: 6 mg/dL (ref 6–20)
CO2: 19 mmol/L — ABNORMAL LOW (ref 22–32)
Calcium: 8.9 mg/dL (ref 8.9–10.3)
Chloride: 105 mmol/L (ref 101–111)
Creatinine, Ser: 0.63 mg/dL (ref 0.44–1.00)
GFR calc Af Amer: >60 mL/min (ref >60)
GFR calc non Af Amer: >60 mL/min (ref >60)
Glucose, Bld: 91 mg/dL (ref 65–99)
Potassium: 3.7 mmol/L (ref 3.5–5.1)
Sodium: 133 mmol/L — ABNORMAL LOW (ref 135–145)
Total Bilirubin: <0.1 mg/dL — ABNORMAL LOW (ref 0.3–1.2)
Total Protein: 6.5 g/dL (ref 6.5–8.1)

## 2014-12-24 LAB — URINE MICROSCOPIC-ADD ON

## 2014-12-24 LAB — LIPASE, BLOOD: Lipase: 27 U/L (ref 22–51)

## 2014-12-24 MED ORDER — ALBUTEROL SULFATE (2.5 MG/3ML) 0.083% IN NEBU
5.0000 mg | INHALATION_SOLUTION | Freq: Once | RESPIRATORY_TRACT | Status: AC
  Administered 2014-12-24: 5 mg via RESPIRATORY_TRACT
  Filled 2014-12-24: qty 6

## 2014-12-24 MED ORDER — ALBUTEROL SULFATE HFA 108 (90 BASE) MCG/ACT IN AERS
2.0000 | INHALATION_SPRAY | Freq: Once | RESPIRATORY_TRACT | Status: AC
  Administered 2014-12-24: 2 via RESPIRATORY_TRACT
  Filled 2014-12-24: qty 6.7

## 2014-12-24 NOTE — ED Notes (Signed)
Pt remains in ultrasound.

## 2014-12-24 NOTE — ED Notes (Signed)
[redacted] weeks pregnant, G2P0.  Last OV: 12-15-14, ce exam, no complications, monitoring BP d/t HTN.  No vaginal bleeding, leaking fluid, contractions.  Pt last felt FM 12-19-14.

## 2014-12-24 NOTE — ED Notes (Signed)
She c/o cough with mucous, congestion for past several days. Yesterday she heard a "pop" in R ribcage while coughing and shes had pain there since. Denies fevers. A&Ox4, resp e/u

## 2014-12-24 NOTE — Discharge Instructions (Signed)
User inhaler 2 puffs every 4 hours as needed for wheezing and shortness of breath. Take Tylenol for pain. Stop smoking. Your ultrasound and chest x-ray today were normal. Please return if any worsening symptoms.  Cough, Adult  A cough is a reflex that helps clear your throat and airways. It can help heal the body or may be a reaction to an irritated airway. A cough may only last 2 or 3 weeks (acute) or may last more than 8 weeks (chronic).  CAUSES Acute cough:  Viral or bacterial infections. Chronic cough:  Infections.  Allergies.  Asthma.  Post-nasal drip.  Smoking.  Heartburn or acid reflux.  Some medicines.  Chronic lung problems (COPD).  Cancer. SYMPTOMS   Cough.  Fever.  Chest pain.  Increased breathing rate.  High-pitched whistling sound when breathing (wheezing).  Colored mucus that you cough up (sputum). TREATMENT   A bacterial cough may be treated with antibiotic medicine.  A viral cough must run its course and will not respond to antibiotics.  Your caregiver may recommend other treatments if you have a chronic cough. HOME CARE INSTRUCTIONS   Only take over-the-counter or prescription medicines for pain, discomfort, or fever as directed by your caregiver. Use cough suppressants only as directed by your caregiver.  Use a cold steam vaporizer or humidifier in your bedroom or home to help loosen secretions.  Sleep in a semi-upright position if your cough is worse at night.  Rest as needed.  Stop smoking if you smoke. SEEK IMMEDIATE MEDICAL CARE IF:   You have pus in your sputum.  Your cough starts to worsen.  You cannot control your cough with suppressants and are losing sleep.  You begin coughing up blood.  You have difficulty breathing.  You develop pain which is getting worse or is uncontrolled with medicine.  You have a fever. MAKE SURE YOU:   Understand these instructions.  Will watch your condition.  Will get help right away  if you are not doing well or get worse. Document Released: 09/21/2010 Document Revised: 06/17/2011 Document Reviewed: 09/21/2010 [facility] Patient Information 2015 [place], [place]. This information is not intended to replace advice given to you by your health care provider. Make sure you discuss any questions you have with your health care provider.

## 2014-12-24 NOTE — ED Provider Notes (Signed)
CSN: [id number]     Arrival date & time 12/24/14  1721 History   First MD Initiated Contact with Patient 12/24/14 1733     Chief Complaint  Patient presents with  . Cough     (Consider location/radiation/quality/duration/timing/severity/associated sxs/prior Treatment) HPI Julia Hopkins is a 24 y.o. female, G2 P0, at 54wk 6d gestation, presents to emergency department complaining of right lower chest/right upper abdominal pain, cough, wheezing. Patient states she started having cough and wheezing a week ago. She states she lost her inhaler. She is still a smoker. She states yesterday she was coughing so bad that she felt a "pop" in the right lower chest. Since then she reports pain has been getting progressively worse. She denies any shortness of breath, but states she continues to wheeze. Denies any fever or chills. No urinary symptoms and denies any vaginal discharge or bleeding. She denies any lower abdominal pain or contractions. She has not taken anything for pain or for cough prior to coming in.  Past Medical History  Diagnosis Date  . Hypertension   . Asthma     exercise induced  . Depression   . Stroke     2014, "dehydration, exhaustion"   Past Surgical History  Procedure Laterality Date  . Wisdom tooth extraction      x 4  . Dilation and curettage of uterus      x 1   Family History  Problem Relation Age of Onset  . Healthy Sister    Social History  Substance Use Topics  . Smoking status: Current Every Day Smoker -- 0.25 packs/day for 11 years    Types: Cigarettes  . Smokeless tobacco: None  . Alcohol Use: No   OB History    Gravida Para Term Preterm AB TAB SAB Ectopic Multiple Living   Review of Systems  Constitutional: Negative for fever and chills.  Respiratory: Positive for cough and wheezing. Negative for chest tightness and shortness of breath.   Cardiovascular: Negative for chest pain, palpitations and leg swelling.  Gastrointestinal:  Positive for abdominal pain. Negative for nausea, vomiting and diarrhea.  Genitourinary: Negative for dysuria, flank pain, vaginal bleeding, vaginal discharge, vaginal pain and pelvic pain.  Musculoskeletal: Negative for myalgias, arthralgias, neck pain and neck stiffness.  Skin: Negative for rash.  Neurological: Negative for dizziness, weakness and headaches.  All other systems reviewed and are negative.     Allergies  Review of patient's allergies indicates no known allergies.  Home Medications   Prior to Admission medications   Medication Sig Start Date End Date Taking? Authorizing Provider  albuterol (PROVENTIL HFA;VENTOLIN HFA) 108 (90 BASE) MCG/ACT inhaler Inhale 2 puffs into the lungs every 6 (six) hours as needed for wheezing or shortness of breath. 12/15/14   Marlis Edelson, CNM  aspirin EC 81 MG tablet Take 1 tablet (81 mg total) by mouth daily. 10/24/14   Peggy Constant, MD  docusate sodium (COLACE) 250 MG capsule Take 1 capsule (250 mg total) by mouth 2 (two) times daily. 12/15/14   Marlis Edelson, CNM  Fluticasone-Salmeterol (ADVAIR DISKUS) 100-50 MCG/DOSE AEPB Inhale 1 puff into the lungs 2 (two) times daily. 12/15/14   Marlis Edelson, CNM  Prenat-FeFum-FePo-FA-Omega 3 (CONCEPT DHA) 53.5-38-1 MG CAPS Take 1 tablet by mouth daily. 12/15/14   Marlis Edelson, CNM  Prenatal Vit-Fe Fumarate-FA (PRENATAL VITAMIN PO) Take by mouth.    Historical Provider, MD  BP 125/58 mmHg  Pulse 112  Temp(Src) 97.4 F (36.3 C) (Oral)  Resp 20  Ht 5\' 2"  (1.575 m)  Wt 240 lb 4.8 oz (108.999 kg)  BMI 43.94 kg/m2  SpO2 100%  LMP 07/03/2014 (Exact Date) Physical Exam  Constitutional: She is oriented to person, place, and time. She appears well-developed and well-nourished. No distress.  HENT:  Head: Normocephalic.  Mouth/Throat: Oropharynx is clear and moist.  Eyes: Conjunctivae are normal.  Neck: Neck supple.  Cardiovascular: Normal rate, regular rhythm and normal heart sounds.    Pulmonary/Chest: Effort normal. No respiratory distress. She has wheezes. She has no rales.  Expiratory wheezes in both lung fields  Abdominal: Soft. Bowel sounds are normal. She exhibits no distension. There is tenderness. There is no rebound.  Right upper quadrant  Musculoskeletal: She exhibits no edema.  Neurological: She is alert and oriented to person, place, and time.  Skin: Skin is warm and dry.  Psychiatric: She has a normal mood and affect. Her behavior is normal.  Nursing note and vitals reviewed.   ED Course  Procedures (including critical care time) Labs Review Labs Reviewed  CBC WITH DIFFERENTIAL/PLATELET - Abnormal; Notable for the following:    WBC 13.3 (*)    RBC 3.79 (*)    Hemoglobin 11.1 (*)    HCT 32.6 (*)    Neutro Abs 10.2 (*)    All other components within normal limits  COMPREHENSIVE METABOLIC PANEL - Abnormal; Notable for the following:    Sodium 133 (*)    CO2 19 (*)    Albumin 3.0 (*)    Total Bilirubin <0.1 (*)    All other components within normal limits  URINALYSIS, ROUTINE W REFLEX MICROSCOPIC (NOT AT [facility]) - Abnormal; Notable for the following:    Specific Gravity, Urine 1.034 (*)    Ketones, ur 15 (*)    Leukocytes, UA TRACE (*)    All other components within normal limits  URINE MICROSCOPIC-ADD ON - Abnormal; Notable for the following:    Squamous Epithelial / LPF FEW (*)    Bacteria, UA FEW (*)    Crystals CA OXALATE CRYSTALS (*)    All other components within normal limits  LIPASE, BLOOD    Imaging Review Dg Chest 2 View  12/24/2014   CLINICAL DATA:  Shortness of breath, right-sided chest pain and cough in a pregnant patient.  EXAM: CHEST  2 VIEW  COMPARISON:  None.  FINDINGS: Cardiomediastinal silhouette is normal. Mediastinal contours appear intact.  There is no evidence of focal airspace consolidation, pleural effusion or pneumothorax.  Osseous structures are without acute abnormality. Soft tissues are grossly normal.  IMPRESSION: No  radiographic evidence of acute cardiopulmonary abnormality.   Electronically Signed   By: Ted Mcalpine M.D.   On: 12/24/2014 19:02   US Abdomen Limited Ruq  12/24/2014   CLINICAL DATA:  Right upper abdominal pain and right chest wall pain. Coughing. Nausea.  EXAM: US ABDOMEN LIMITED - RIGHT UPPER QUADRANT  COMPARISON:  None.  FINDINGS: Gallbladder:  No gallstones or wall thickening visualized. No sonographic Murphy sign noted.  Common bile duct:  Diameter: 2.6 mm  Liver:  No focal lesion identified. Within normal limits in parenchymal echogenicity.  IMPRESSION: Normal right upper quadrant ultrasound.   Electronically Signed   By: Amie Portland M.D.   On: 12/24/2014 19:58   I have personally reviewed and evaluated these images and lab results as part of my medical decision-making.   EKG Interpretation None  MDM   Final diagnoses:  Abdominal pain  Cough    Patient is 21 weeks, 6 days, presents with cough, wheezing, right-sided pain. Vital signs are normal except for mild tachycardia. Will get chest x-ray, labs, urinalysis. Patient does not appear to be any distress. She is wheezing. We'll give her a breathing treatment. This ultrasound confirmed intrauterine pregnancy, good fetal movement, heart rate 150s.  X-rays negative. White count slightly elevated at 13.3. Hemoglobin slightly low at 11.1. Urinalysis contaminated. Patient denies any urinary symptoms. Doubt UTI. Right upper quadrant ultrasound obtained to rule out cholecystitis, and it is negative. Patient's symptoms are most likely from a muscle strain. Patient is not tachycardic or hypoxic. I highly doubt PE. She denies acute distress. She admitted that she came here because she was too uncomfortable to go to work and wanted a work note. Patient given a work note for tomorrow. Home with Tylenol for pain, inhaler for wheezing, follow-up with primary care doctor.  Filed Vitals:   12/24/14 1729 12/24/14 2040  BP: 125/58 109/84   Pulse: 112 86  Temp: 97.4 F (36.3 C) 97.9 F (36.6 C)  TempSrc: Oral Oral  Resp: 20 18  Height: 5\' 2"  (1.575 m)   Weight: 240 lb 4.8 oz (108.999 kg)   SpO2: 100% 100%     Jaynie Crumble, PA-C 12/24/14 2153  Arby Barrette, MD 12/24/14 2224

## 2014-12-24 NOTE — ED Notes (Signed)
Pt returned to room  

## 2014-12-26 ENCOUNTER — Emergency Department (HOSPITAL_COMMUNITY): Payer: Medicaid Other

## 2014-12-26 ENCOUNTER — Emergency Department (HOSPITAL_COMMUNITY)
Admission: EM | Admit: 2014-12-26 | Discharge: 2014-12-27 | Disposition: A | Discharge status: Home / Self Care | Payer: Medicaid Other | Attending: Emergency Medicine | Admitting: Emergency Medicine

## 2014-12-26 ENCOUNTER — Encounter (HOSPITAL_COMMUNITY): Payer: Self-pay | Admitting: Emergency Medicine

## 2014-12-26 DIAGNOSIS — R1011 Right upper quadrant pain: Secondary | ICD-10-CM | POA: Insufficient documentation

## 2014-12-26 DIAGNOSIS — IMO0002 Reserved for concepts with insufficient information to code with codable children: Secondary | ICD-10-CM

## 2014-12-26 DIAGNOSIS — O98812 Other maternal infectious and parasitic diseases complicating pregnancy, second trimester: Secondary | ICD-10-CM | POA: Insufficient documentation

## 2014-12-26 DIAGNOSIS — J45909 Unspecified asthma, uncomplicated: Secondary | ICD-10-CM | POA: Insufficient documentation

## 2014-12-26 DIAGNOSIS — I10 Essential (primary) hypertension: Secondary | ICD-10-CM | POA: Insufficient documentation

## 2014-12-26 DIAGNOSIS — Z79899 Other long term (current) drug therapy: Secondary | ICD-10-CM | POA: Insufficient documentation

## 2014-12-26 DIAGNOSIS — O9989 Other specified diseases and conditions complicating pregnancy, childbirth and the puerperium: Secondary | ICD-10-CM | POA: Diagnosis present

## 2014-12-26 DIAGNOSIS — O169 Unspecified maternal hypertension, unspecified trimester: Secondary | ICD-10-CM

## 2014-12-26 DIAGNOSIS — O99512 Diseases of the respiratory system complicating pregnancy, second trimester: Secondary | ICD-10-CM | POA: Insufficient documentation

## 2014-12-26 DIAGNOSIS — Z3A23 23 weeks gestation of pregnancy: Secondary | ICD-10-CM | POA: Diagnosis not present

## 2014-12-26 DIAGNOSIS — O99282 Endocrine, nutritional and metabolic diseases complicating pregnancy, second trimester: Secondary | ICD-10-CM | POA: Insufficient documentation

## 2014-12-26 DIAGNOSIS — Z8659 Personal history of other mental and behavioral disorders: Secondary | ICD-10-CM | POA: Diagnosis not present

## 2014-12-26 DIAGNOSIS — O99412 Diseases of the circulatory system complicating pregnancy, second trimester: Secondary | ICD-10-CM | POA: Diagnosis not present

## 2014-12-26 DIAGNOSIS — E860 Dehydration: Secondary | ICD-10-CM | POA: Insufficient documentation

## 2014-12-26 DIAGNOSIS — Z7982 Long term (current) use of aspirin: Secondary | ICD-10-CM | POA: Diagnosis not present

## 2014-12-26 DIAGNOSIS — O99332 Smoking (tobacco) complicating pregnancy, second trimester: Secondary | ICD-10-CM | POA: Insufficient documentation

## 2014-12-26 DIAGNOSIS — A749 Chlamydial infection, unspecified: Secondary | ICD-10-CM

## 2014-12-26 DIAGNOSIS — F17210 Nicotine dependence, cigarettes, uncomplicated: Secondary | ICD-10-CM | POA: Diagnosis not present

## 2014-12-26 DIAGNOSIS — O0992 Supervision of high risk pregnancy, unspecified, second trimester: Secondary | ICD-10-CM

## 2014-12-26 DIAGNOSIS — O99213 Obesity complicating pregnancy, third trimester: Secondary | ICD-10-CM

## 2014-12-26 DIAGNOSIS — O99320 Drug use complicating pregnancy, unspecified trimester: Secondary | ICD-10-CM

## 2014-12-26 HISTORY — DX: Major depressive disorder, single episode, unspecified: F32.9

## 2014-12-26 HISTORY — DX: Anxiety disorder, unspecified: F41.9

## 2014-12-26 HISTORY — DX: Personal history of self-harm: Z91.5

## 2014-12-26 HISTORY — DX: Depression, unspecified: F32.A

## 2014-12-26 HISTORY — DX: Personal history of suicidal behavior: Z91.51

## 2014-12-26 MED ORDER — SODIUM CHLORIDE 0.9 % IV BOLUS (SEPSIS)
1000.0000 mL | Freq: Once | INTRAVENOUS | Status: AC
Start: 2014-12-26 — End: 2014-12-26
  Administered 2014-12-26: 1000 mL via INTRAVENOUS

## 2014-12-26 MED ORDER — ACETAMINOPHEN 500 MG PO TABS
1000.0000 mg | ORAL_TABLET | Freq: Once | ORAL | Status: AC
  Administered 2014-12-26: 1000 mg via ORAL
  Filled 2014-12-26: qty 2

## 2014-12-26 MED ORDER — TETANUS-DIPHTH-ACELL PERTUSSIS 5-2.5-18.5 LF-MCG/0.5 IM SUSP
0.5000 mL | Freq: Once | INTRAMUSCULAR | Status: AC
  Administered 2014-12-26: 0.5 mL via INTRAMUSCULAR
  Filled 2014-12-26: qty 0.5
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
## 2014-12-26 NOTE — ED Notes (Signed)
  Pt does not want "Julia Hopkins, or Julia Hopkins" to be allowed to know that she is here does not want them in her room or to be able to call her. Pt would also prefer it if she would not have female care providers.

## 2014-12-26 NOTE — ED Notes (Signed)
Patient transported to CT 
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
## 2014-12-26 NOTE — ED Notes (Signed)
OB Rapid Response called 

## 2014-12-26 NOTE — ED Notes (Signed)
Patient decided to leave stated that the wait time was too long
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
## 2014-12-26 NOTE — ED Notes (Signed)
Pt taken to waiting room per her request to get some "fresh air"; Julia Hopkins, Child psychotherapist notified and will take pt taxy voucher out to her. Pt verbalizes understanding of discharge instructions. NAD on departure. Ambulatory with steady gait. VSS. A/O x4.

## 2014-12-27 ENCOUNTER — Emergency Department (HOSPITAL_COMMUNITY)
Admission: EM | Admit: 2014-12-27 | Discharge: 2014-12-27 | Disposition: A | Discharge status: Home / Self Care | Payer: Medicaid Other | Source: Home / Self Care | Attending: Emergency Medicine | Admitting: Emergency Medicine

## 2014-12-27 ENCOUNTER — Encounter (HOSPITAL_COMMUNITY): Payer: Self-pay | Admitting: Emergency Medicine

## 2014-12-27 DIAGNOSIS — E860 Dehydration: Secondary | ICD-10-CM

## 2014-12-27 DIAGNOSIS — A599 Trichomoniasis, unspecified: Secondary | ICD-10-CM

## 2014-12-27 LAB — COMPREHENSIVE METABOLIC PANEL
ALT: 20 U/L (ref 14–54)
AST: 22 U/L (ref 15–41)
Albumin: 3.3 g/dL — ABNORMAL LOW (ref 3.5–5.0)
Alkaline Phosphatase: 80 U/L (ref 38–126)
Anion gap: 10 (ref 5–15)
BUN: 8 mg/dL (ref 6–20)
CO2: 19 mmol/L — ABNORMAL LOW (ref 22–32)
Calcium: 9.5 mg/dL (ref 8.9–10.3)
Chloride: 107 mmol/L (ref 101–111)
Creatinine, Ser: 0.65 mg/dL (ref 0.44–1.00)
GFR calc Af Amer: >60 mL/min (ref >60)
GFR calc non Af Amer: >60 mL/min (ref >60)
Glucose, Bld: 81 mg/dL (ref 65–99)
Potassium: 3.5 mmol/L (ref 3.5–5.1)
Sodium: 136 mmol/L (ref 135–145)
Total Bilirubin: 0.6 mg/dL (ref 0.3–1.2)
Total Protein: 6.8 g/dL (ref 6.5–8.1)

## 2014-12-27 LAB — URINALYSIS, ROUTINE W REFLEX MICROSCOPIC
Glucose, UA: NEGATIVE mg/dL
Hgb urine dipstick: NEGATIVE
Ketones, ur: >80 mg/dL — AB
Nitrite: NEGATIVE
Protein, ur: NEGATIVE mg/dL
Specific Gravity, Urine: 1.031 — ABNORMAL HIGH (ref 1.005–1.030)
Urobilinogen, UA: 0.2 mg/dL (ref 0.0–1.0)
pH: 6 (ref 5.0–8.0)

## 2014-12-27 LAB — CBC
HCT: 34.7 % — ABNORMAL LOW (ref 36.0–46.0)
Hemoglobin: 11.7 g/dL — ABNORMAL LOW (ref 12.0–15.0)
MCH: 29 pg (ref 26.0–34.0)
MCHC: 33.7 g/dL (ref 30.0–36.0)
MCV: 85.9 fL (ref 78.0–100.0)
Platelets: 342 10*3/uL (ref 150–400)
RBC: 4.04 MIL/uL (ref 3.87–5.11)
RDW: 13.3 % (ref 11.5–15.5)
WBC: 12 10*3/uL — ABNORMAL HIGH (ref 4.0–10.5)

## 2014-12-27 LAB — URINE MICROSCOPIC-ADD ON

## 2014-12-27 LAB — WET PREP, GENITAL
Trich, Wet Prep: NONE SEEN
Yeast Wet Prep HPF POC: NONE SEEN

## 2014-12-27 LAB — LIPASE, BLOOD: Lipase: 21 U/L — ABNORMAL LOW (ref 22–51)

## 2014-12-27 MED ORDER — AZITHROMYCIN 250 MG PO TABS
1000.0000 mg | ORAL_TABLET | Freq: Once | ORAL | Status: AC
  Administered 2014-12-27: 1000 mg via ORAL
  Filled 2014-12-27: qty 4

## 2014-12-27 MED ORDER — CEFTRIAXONE SODIUM 250 MG IJ SOLR
250.0000 mg | Freq: Once | INTRAMUSCULAR | Status: AC
  Administered 2014-12-27: 250 mg via INTRAMUSCULAR
  Filled 2014-12-27: qty 250

## 2014-12-27 MED ORDER — LIDOCAINE HCL (PF) 1 % IJ SOLN
0.9000 mL | Freq: Once | INTRAMUSCULAR | Status: AC
  Administered 2014-12-27: 0.9 mL
  Filled 2014-12-27: qty 5

## 2014-12-27 MED ORDER — ALBUTEROL SULFATE (2.5 MG/3ML) 0.083% IN NEBU
5.0000 mg | INHALATION_SOLUTION | Freq: Once | RESPIRATORY_TRACT | Status: AC
  Administered 2014-12-27: 5 mg via RESPIRATORY_TRACT
  Filled 2014-12-27: qty 6

## 2014-12-27 MED ORDER — ONDANSETRON HCL 4 MG/2ML IJ SOLN
4.0000 mg | Freq: Once | INTRAMUSCULAR | Status: AC
  Administered 2014-12-27: 4 mg via INTRAVENOUS
  Filled 2014-12-27: qty 2

## 2014-12-27 MED ORDER — ALBUTEROL SULFATE HFA 108 (90 BASE) MCG/ACT IN AERS
1.0000 | INHALATION_SPRAY | RESPIRATORY_TRACT | Status: DC | PRN
  Administered 2014-12-27: 2 via RESPIRATORY_TRACT
  Filled 2014-12-27: qty 6.7

## 2014-12-27 MED ORDER — IPRATROPIUM BROMIDE 0.02 % IN SOLN
0.5000 mg | Freq: Once | RESPIRATORY_TRACT | Status: AC
  Administered 2014-12-27: 0.5 mg via RESPIRATORY_TRACT
  Filled 2014-12-27: qty 2.5

## 2014-12-27 MED ORDER — SODIUM CHLORIDE 0.9 % IV BOLUS (SEPSIS)
1000.0000 mL | Freq: Once | INTRAVENOUS | Status: AC
  Administered 2014-12-27: 1000 mL via INTRAVENOUS

## 2014-12-27 MED ORDER — METRONIDAZOLE 500 MG PO TABS
2000.0000 mg | ORAL_TABLET | Freq: Once | ORAL | Status: AC
  Administered 2014-12-27: 2000 mg via ORAL
  Filled 2014-12-27: qty 4

## 2014-12-27 NOTE — ED Notes (Signed)
Pt tolerating PO well at this time.  

## 2014-12-27 NOTE — ED Notes (Signed)
OB [facility] notified. 

## 2014-12-27 NOTE — ED Notes (Signed)
Julia Hopkins Client SW, aware pt is requesting to speak with her.

## 2014-12-27 NOTE — ED Notes (Signed)
This documentation is erroneous. Patient was discharged yesterday, but documentation system will not allow discharge without pain assessment within 1 hour.

## 2014-12-27 NOTE — ED Notes (Signed)
MD at bedside. 

## 2014-12-27 NOTE — ED Notes (Signed)
Pt ambulated to bathroom. Steady gait

## 2014-12-27 NOTE — ED Notes (Signed)
Pt refusing to keep BP cuff and O2 sensor on.
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
## 2014-12-27 NOTE — Discharge Instructions (Signed)

## 2014-12-27 NOTE — ED Notes (Signed)
Patient states abdominal pain and flank pain.   Patient states 5 months pregnant.    Patient states unable to keep anything down.   Patient states she did urinate on herself this morning.   Patient states vomiting since this morning.   Patient states no additional urinary symptoms.
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
## 2014-12-27 NOTE — ED Notes (Signed)
Pt resting at this time, NAD, VSS.

## 2014-12-27 NOTE — ED Notes (Signed)
Dahlia Client SW at bedside

## 2014-12-28 LAB — GC/CHLAMYDIA PROBE AMP (~~LOC~~) NOT AT ARMC
Chlamydia: NEGATIVE
Neisseria Gonorrhea: NEGATIVE

## 2015-01-06 ENCOUNTER — Encounter: Payer: Medicaid Other | Admitting: Family Medicine

## 2015-01-12 ENCOUNTER — Ambulatory Visit (INDEPENDENT_AMBULATORY_CARE_PROVIDER_SITE_OTHER): Payer: Medicaid Other | Admitting: Family

## 2015-01-12 VITALS — BP 127/83 | HR 93 | Temp 97.7°F | Wt 240.2 lb

## 2015-01-12 DIAGNOSIS — A749 Chlamydial infection, unspecified: Secondary | ICD-10-CM | POA: Diagnosis not present

## 2015-01-12 DIAGNOSIS — O98312 Other infections with a predominantly sexual mode of transmission complicating pregnancy, second trimester: Secondary | ICD-10-CM

## 2015-01-12 DIAGNOSIS — O0992 Supervision of high risk pregnancy, unspecified, second trimester: Secondary | ICD-10-CM

## 2015-01-12 DIAGNOSIS — R109 Unspecified abdominal pain: Secondary | ICD-10-CM

## 2015-01-12 DIAGNOSIS — Z8673 Personal history of transient ischemic attack (TIA), and cerebral infarction without residual deficits: Secondary | ICD-10-CM | POA: Diagnosis not present

## 2015-01-12 DIAGNOSIS — O98812 Other maternal infectious and parasitic diseases complicating pregnancy, second trimester: Secondary | ICD-10-CM

## 2015-01-12 DIAGNOSIS — O9989 Other specified diseases and conditions complicating pregnancy, childbirth and the puerperium: Secondary | ICD-10-CM | POA: Diagnosis not present

## 2015-01-12 DIAGNOSIS — Z23 Encounter for immunization: Secondary | ICD-10-CM | POA: Diagnosis not present

## 2015-01-12 DIAGNOSIS — O26899 Other specified pregnancy related conditions, unspecified trimester: Secondary | ICD-10-CM

## 2015-01-12 DIAGNOSIS — O169 Unspecified maternal hypertension, unspecified trimester: Secondary | ICD-10-CM

## 2015-01-12 LAB — POCT URINALYSIS DIP (DEVICE)
Bilirubin Urine: NEGATIVE
Glucose, UA: NEGATIVE mg/dL
Hgb urine dipstick: NEGATIVE
Ketones, ur: NEGATIVE mg/dL
Leukocytes, UA: NEGATIVE
Nitrite: NEGATIVE
Protein, ur: NEGATIVE mg/dL
Specific Gravity, Urine: 1.025 (ref 1.005–1.030)
Urobilinogen, UA: 0.2 mg/dL (ref 0.0–1.0)
pH: 7 (ref 5.0–8.0)

## 2015-01-12 LAB — LIPASE: Lipase: 29 U/L (ref 7–60)

## 2015-01-12 LAB — COMPREHENSIVE METABOLIC PANEL
ALT: 16 U/L (ref 6–29)
AST: 13 U/L (ref 10–30)
Albumin: 3.4 g/dL — ABNORMAL LOW (ref 3.6–5.1)
Alkaline Phosphatase: 82 U/L (ref 33–115)
BUN: 6 mg/dL — ABNORMAL LOW (ref 7–25)
CO2: 22 mmol/L (ref 20–31)
Calcium: 8.9 mg/dL (ref 8.6–10.2)
Chloride: 110 mmol/L (ref 98–110)
Creat: 0.55 mg/dL (ref 0.50–1.10)
Glucose, Bld: 99 mg/dL (ref 65–99)
Potassium: 3.9 mmol/L (ref 3.5–5.3)
Sodium: 139 mmol/L (ref 135–146)
Total Bilirubin: 0.2 mg/dL (ref 0.2–1.2)
Total Protein: 6.1 g/dL (ref 6.1–8.1)

## 2015-01-12 LAB — AMYLASE: Amylase: 44 U/L (ref 0–105)

## 2015-01-12 NOTE — Progress Notes (Signed)
Subjective:  Julia Hopkins is a 24 y.o. G2P0010 at [redacted]w[redacted]d being seen today for ongoing prenatal care.  Patient reports upper abdominal pain that increases with eating.  +nausea accompanied by the pain.  .  Contractions: Not present.  Vag. Bleeding: None. Movement: Present. Denies leaking of fluid.   The following portions of the patient's history were reviewed and updated as appropriate: allergies, current medications, past family history, past medical history, past social history, past surgical history and problem list.   Objective:   Filed Vitals:   01/12/15 1145  BP: 127/83  Pulse: 93  Temp: 97.7 F (36.5 C)  Weight: 240 lb 3.2 oz (108.954 kg)    Fetal Status: Fetal Heart Rate (bpm): 142 Fundal Height: 27 cm Movement: Present     General:  Alert, oriented and cooperative. Patient is in no acute distress.  Skin: Skin is warm and dry. No rash noted.   Cardiovascular: Normal heart rate noted  Respiratory: Normal respiratory effort, no problems with respiration noted  Abdomen: Soft, gravid, appropriate for gestational age. Pain/Pressure: Present     Pelvic: Vag. Bleeding: None     Cervical exam deferred        Extremities: Normal range of motion.  Edema: Trace  Mental Status: Normal mood and affect. Normal behavior. Normal judgment and thought content.   Urinalysis: Urine Protein: Negative Urine Glucose: Negative  Assessment and Plan:  Pregnancy: G2P0010 at [redacted]w[redacted]d  1. Chlamydia infection affecting pregnancy, antepartum, second trimester - TOC negative  2. Flu vaccine need - Flu Vaccine QUAD 36+ mos IM; Standing - Flu Vaccine QUAD 36+ mos IM  3. Abdominal Pain  - CMP, lipase, amylase; consider abdominal US if continues  4. Hypertension affecting pregnancy, antepartum, unspecified trimester - Growth [place] tomorrow  Preterm labor symptoms and general obstetric precautions including but not limited to vaginal bleeding, contractions, leaking of fluid and fetal movement were reviewed  in detail with the patient. Please refer to After Visit Summary for other counseling recommendations.  Return in about 2 weeks (around 01/26/2015).   Eino Farber Kennith Gain, CNM

## 2015-01-12 NOTE — Progress Notes (Signed)
Flu vaccine today Breastfeeding tip of the week.

## 2015-01-13 ENCOUNTER — Encounter (HOSPITAL_COMMUNITY): Payer: Self-pay

## 2015-01-13 ENCOUNTER — Ambulatory Visit (HOSPITAL_COMMUNITY)
Admission: RE | Admit: 2015-01-13 | Discharge: 2015-01-13 | Disposition: A | Discharge status: Home / Self Care | Payer: Medicaid Other | Source: Ambulatory Visit | Attending: Family | Admitting: Family

## 2015-01-13 DIAGNOSIS — O169 Unspecified maternal hypertension, unspecified trimester: Secondary | ICD-10-CM | POA: Insufficient documentation

## 2015-02-02 ENCOUNTER — Ambulatory Visit (INDEPENDENT_AMBULATORY_CARE_PROVIDER_SITE_OTHER): Payer: Medicaid Other | Admitting: Obstetrics & Gynecology

## 2015-02-02 VITALS — BP 125/74 | HR 107 | Temp 98.3°F | Wt 246.3 lb

## 2015-02-02 DIAGNOSIS — Z23 Encounter for immunization: Secondary | ICD-10-CM

## 2015-02-02 DIAGNOSIS — O0993 Supervision of high risk pregnancy, unspecified, third trimester: Secondary | ICD-10-CM | POA: Diagnosis not present

## 2015-02-02 LAB — CBC
HCT: 32.4 % — ABNORMAL LOW (ref 36.0–46.0)
Hemoglobin: 10.9 g/dL — ABNORMAL LOW (ref 12.0–15.0)
MCH: 28.8 pg (ref 26.0–34.0)
MCHC: 33.6 g/dL (ref 30.0–36.0)
MCV: 85.5 fL (ref 78.0–100.0)
MPV: 9.6 fL (ref 8.6–12.4)
Platelets: 331 10*3/uL (ref 150–400)
RBC: 3.79 MIL/uL — ABNORMAL LOW (ref 3.87–5.11)
RDW: 13.4 % (ref 11.5–15.5)
WBC: 11.2 10*3/uL — ABNORMAL HIGH (ref 4.0–10.5)

## 2015-02-02 MED ORDER — TETANUS-DIPHTH-ACELL PERTUSSIS 5-2.5-18.5 LF-MCG/0.5 IM SUSP
0.5000 mL | Freq: Once | INTRAMUSCULAR | Status: AC
  Administered 2015-02-02: 0.5 mL via INTRAMUSCULAR

## 2015-02-02 NOTE — Progress Notes (Signed)
Subjective:  Julia Hopkins is a 24 y.o. G2P0010 at 1952w2d being seen today for ongoing prenatal care.  Patient reports no complaints.  Contractions: Not present.  Vag. Bleeding: None. Movement: Present. Denies leaking of fluid.   The following portions of the patient's history were reviewed and updated as appropriate: allergies, current medications, past family history, past medical history, past social history, past surgical history and problem list. Problem list updated.  Objective:   Filed Vitals:   02/02/15 0952  BP: 125/74  Pulse: 107  Temp: 98.3 F (36.8 C)  Weight: 246 lb 4.8 oz (111.721 kg)    Fetal Status: Fetal Heart Rate (bpm): 146 Fundal Height: 28 cm Movement: Present     General:  Alert, oriented and cooperative. Patient is in no acute distress.  Skin: Skin is warm and dry. No rash noted.   Cardiovascular: Normal heart rate noted  Respiratory: Normal respiratory effort, no problems with respiration noted  Abdomen: Soft, gravid, appropriate for gestational age. Pain/Pressure: Present     Pelvic: Vag. Bleeding: None     Cervical exam deferred        Extremities: Normal range of motion.  Edema: Trace  Mental Status: Normal mood and affect. Normal behavior. Normal judgment and thought content.   Urinalysis:    not back at time of chart closure.  RN Louellen MolderBellamy to chart  Assessment and Plan:  Pregnancy: G2P0010 at 8752w2d  1. Supervision of high risk pregnancy, antepartum, third trimester -BP well controlled.  Getting serial US per protocol. - CBC - RPR - HIV antibody (with reflex) - Glucose Tolerance, 1 HR (50g) - Tdap today  Preterm labor symptoms and general obstetric precautions including but not limited to vaginal bleeding, contractions, leaking of fluid and fetal movement were reviewed in detail with the patient. Please refer to After Visit Summary for other counseling recommendations.  Return in about 2 weeks (around 02/16/2015).   Lesly DukesKelly H Karolina Zamor, MD

## 2015-02-02 NOTE — Progress Notes (Signed)
Edema- ankles   Pain- sharp pain lower abd  28 wk labs and packet given

## 2015-02-03 LAB — HIV ANTIBODY (ROUTINE TESTING W REFLEX): HIV 1&2 Ab, 4th Generation: NONREACTIVE

## 2015-02-03 LAB — GLUCOSE TOLERANCE, 1 HOUR (50G) W/O FASTING: Glucose, 1 Hour GTT: 77 mg/dL (ref 70–140)

## 2015-02-04 LAB — RPR

## 2015-02-10 ENCOUNTER — Encounter (HOSPITAL_COMMUNITY): Payer: Self-pay

## 2015-02-10 ENCOUNTER — Ambulatory Visit (HOSPITAL_COMMUNITY)
Admission: RE | Admit: 2015-02-10 | Discharge: 2015-02-10 | Disposition: A | Discharge status: Home / Self Care | Payer: Medicaid Other | Source: Ambulatory Visit | Attending: Family | Admitting: Family

## 2015-02-10 ENCOUNTER — Other Ambulatory Visit (HOSPITAL_COMMUNITY): Payer: Self-pay | Admitting: Obstetrics and Gynecology

## 2015-02-10 DIAGNOSIS — O10913 Unspecified pre-existing hypertension complicating pregnancy, third trimester: Secondary | ICD-10-CM | POA: Insufficient documentation

## 2015-02-10 DIAGNOSIS — Z3A29 29 weeks gestation of pregnancy: Secondary | ICD-10-CM

## 2015-02-10 DIAGNOSIS — O169 Unspecified maternal hypertension, unspecified trimester: Secondary | ICD-10-CM

## 2015-02-10 DIAGNOSIS — O99333 Smoking (tobacco) complicating pregnancy, third trimester: Secondary | ICD-10-CM | POA: Insufficient documentation

## 2015-02-10 DIAGNOSIS — O99213 Obesity complicating pregnancy, third trimester: Secondary | ICD-10-CM | POA: Diagnosis not present

## 2015-02-10 DIAGNOSIS — E669 Obesity, unspecified: Secondary | ICD-10-CM | POA: Insufficient documentation

## 2015-02-10 DIAGNOSIS — O2693 Pregnancy related conditions, unspecified, third trimester: Secondary | ICD-10-CM

## 2015-02-10 DIAGNOSIS — F17210 Nicotine dependence, cigarettes, uncomplicated: Secondary | ICD-10-CM | POA: Insufficient documentation

## 2015-02-13 ENCOUNTER — Encounter (HOSPITAL_COMMUNITY): Payer: Self-pay

## 2015-02-13 ENCOUNTER — Inpatient Hospital Stay (HOSPITAL_COMMUNITY)
Admission: AD | Admit: 2015-02-13 | Discharge: 2015-02-14 | Disposition: A | Discharge status: Home / Self Care | Payer: Medicaid Other | Source: Ambulatory Visit | Attending: Family Medicine | Admitting: Family Medicine

## 2015-02-13 DIAGNOSIS — O26893 Other specified pregnancy related conditions, third trimester: Secondary | ICD-10-CM | POA: Diagnosis not present

## 2015-02-13 DIAGNOSIS — O162 Unspecified maternal hypertension, second trimester: Secondary | ICD-10-CM

## 2015-02-13 DIAGNOSIS — A749 Chlamydial infection, unspecified: Secondary | ICD-10-CM

## 2015-02-13 DIAGNOSIS — Z3A30 30 weeks gestation of pregnancy: Secondary | ICD-10-CM | POA: Diagnosis not present

## 2015-02-13 DIAGNOSIS — H532 Diplopia: Secondary | ICD-10-CM | POA: Insufficient documentation

## 2015-02-13 DIAGNOSIS — O10913 Unspecified pre-existing hypertension complicating pregnancy, third trimester: Secondary | ICD-10-CM | POA: Diagnosis present

## 2015-02-13 DIAGNOSIS — O0993 Supervision of high risk pregnancy, unspecified, third trimester: Secondary | ICD-10-CM

## 2015-02-13 DIAGNOSIS — O99320 Drug use complicating pregnancy, unspecified trimester: Secondary | ICD-10-CM

## 2015-02-13 DIAGNOSIS — E669 Obesity, unspecified: Secondary | ICD-10-CM

## 2015-02-13 DIAGNOSIS — H53149 Visual discomfort, unspecified: Secondary | ICD-10-CM | POA: Insufficient documentation

## 2015-02-13 DIAGNOSIS — O169 Unspecified maternal hypertension, unspecified trimester: Secondary | ICD-10-CM

## 2015-02-13 DIAGNOSIS — R51 Headache: Secondary | ICD-10-CM | POA: Insufficient documentation

## 2015-02-13 DIAGNOSIS — O99213 Obesity complicating pregnancy, third trimester: Secondary | ICD-10-CM

## 2015-02-13 DIAGNOSIS — I10 Essential (primary) hypertension: Secondary | ICD-10-CM

## 2015-02-13 DIAGNOSIS — O98812 Other maternal infectious and parasitic diseases complicating pregnancy, second trimester: Secondary | ICD-10-CM

## 2015-02-13 DIAGNOSIS — O99332 Smoking (tobacco) complicating pregnancy, second trimester: Secondary | ICD-10-CM

## 2015-02-13 DIAGNOSIS — O0992 Supervision of high risk pregnancy, unspecified, second trimester: Secondary | ICD-10-CM

## 2015-02-13 LAB — URINALYSIS, ROUTINE W REFLEX MICROSCOPIC
Bilirubin Urine: NEGATIVE
Glucose, UA: NEGATIVE mg/dL
Hgb urine dipstick: NEGATIVE
Ketones, ur: NEGATIVE mg/dL
Leukocytes, UA: NEGATIVE
Nitrite: NEGATIVE
Protein, ur: NEGATIVE mg/dL
Specific Gravity, Urine: 1.025 (ref 1.005–1.030)
Urobilinogen, UA: 0.2 mg/dL (ref 0.0–1.0)
pH: 5.5 (ref 5.0–8.0)

## 2015-02-13 MED ORDER — BUTALBITAL-APAP-CAFFEINE 50-325-40 MG PO TABS
2.0000 | ORAL_TABLET | Freq: Once | ORAL | Status: AC
  Administered 2015-02-13: 2 via ORAL
  Filled 2015-02-13: qty 2
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
## 2015-02-13 NOTE — MAU Note (Signed)
Pt here by EMS with c/o headache since Wednesday.  Took a Tylenol on Thursday and some Asprin on Friday, both gave no relief.  Reports positive fetal movement. Denies any bleeding or leaking of fluid.

## 2015-02-14 ENCOUNTER — Encounter (HOSPITAL_COMMUNITY): Payer: Self-pay | Admitting: Family Medicine

## 2015-02-14 MED ORDER — CYCLOBENZAPRINE HCL 5 MG PO TABS: 5.0000 mg | ORAL_TABLET | Freq: Three times a day (TID) | ORAL | Status: DC | PRN

## 2015-02-14 MED ORDER — ASPIRIN EC 81 MG PO TBEC: 81.0000 mg | DELAYED_RELEASE_TABLET | Freq: Every day | ORAL | Status: DC

## 2015-02-14 MED ORDER — BUTALBITAL-APAP-CAFFEINE 50-325-40 MG PO TABS: 1.0000 | ORAL_TABLET | Freq: Four times a day (QID) | ORAL | Status: DC | PRN

## 2015-02-14 NOTE — MAU Note (Signed)
Discharge instructions given; pt reports needing a ride home with no one she can call tonight and requests a taxi. Taxi voucher given and cab called by [employer] operational officersecretary. Pt D/C home.

## 2015-02-14 NOTE — Discharge Instructions (Signed)
Cervical Sprain °A cervical sprain is an injury in the neck in which the strong, fibrous tissues (ligaments) that connect your neck bones stretch or tear. Cervical sprains can range from mild to severe. Severe cervical sprains can cause the neck vertebrae to be unstable. This can lead to damage of the spinal cord and can result in serious nervous system problems. The amount of time it takes for a cervical sprain to get better depends on the cause and extent of the injury. Most cervical sprains heal in 1 to 3 weeks. °CAUSES  °Severe cervical sprains may be caused by:  °· Contact sport injuries (such as from football, rugby, wrestling, hockey, auto racing, gymnastics, diving, martial arts, or boxing).   °· Motor vehicle collisions.   °· Whiplash injuries. This is an injury from a sudden forward and backward whipping movement of the head and neck.  °· Falls.   °Mild cervical sprains may be caused by:  °· Being in an awkward position, such as while cradling a telephone between your ear and shoulder.   °· Sitting in a chair that does not offer proper support.   °· Working at a poorly designed computer station.   °· Looking up or down for long periods of time.   °SYMPTOMS  °· Pain, soreness, stiffness, or a burning sensation in the front, back, or sides of the neck. This discomfort may develop immediately after the injury or slowly, 24 hours or more after the injury.   °· Pain or tenderness directly in the middle of the back of the neck.   °· Shoulder or upper back pain.   °· Limited ability to move the neck.   °· Headache.   °· Dizziness.   °· Weakness, numbness, or tingling in the hands or arms.   °· Muscle spasms.   °· Difficulty swallowing or chewing.   °· Tenderness and swelling of the neck.   °DIAGNOSIS  °Most of the time your health care provider can diagnose a cervical sprain by taking your history and doing a physical exam. Your health care provider will ask about previous neck injuries and any known neck  problems, such as arthritis in the neck. X-rays may be taken to find out if there are any other problems, such as with the bones of the neck. Other tests, such as a CT scan or MRI, may also be needed.  °TREATMENT  °Treatment depends on the severity of the cervical sprain. Mild sprains can be treated with rest, keeping the neck in place (immobilization), and pain medicines. Severe cervical sprains are immediately immobilized. Further treatment is done to help with pain, muscle spasms, and other symptoms and may include: °· Medicines, such as pain relievers, numbing medicines, or muscle relaxants.   °· Physical therapy. This may involve stretching exercises, strengthening exercises, and posture training. Exercises and improved posture can help stabilize the neck, strengthen muscles, and help stop symptoms from returning.   °HOME CARE INSTRUCTIONS  °· Put ice on the injured area.   °¨ Put ice in a plastic bag.   °¨ Place a towel between your skin and the bag.   °¨ Leave the ice on for 15-20 minutes, 3-4 times a day.   °· If your injury was severe, you may have been given a cervical collar to wear. A cervical collar is a two-piece collar designed to keep your neck from moving while it heals. °¨ Do not remove the collar unless instructed by your health care provider. °¨ If you have long hair, keep it outside of the collar. °¨ Ask your health care provider before making any adjustments to your collar. Minor   adjustments may be required over time to improve comfort and reduce pressure on your chin or on the back of your head.  Ifyou are allowed to remove the collar for cleaning or bathing, follow your health care provider's instructions on how to do so safely.  Keep your collar clean by wiping it with mild soap and water and drying it completely. If the collar you have been given includes removable pads, remove them every 1-2 days and hand wash them with soap and water. Allow them to air dry. They should be completely  dry before you wear them in the collar.  If you are allowed to remove the collar for cleaning and bathing, wash and dry the skin of your neck. Check your skin for irritation or sores. If you see any, tell your health care provider.  Do not drive while wearing the collar.   Only take over-the-counter or prescription medicines for pain, discomfort, or fever as directed by your health care provider.   Keep all follow-up appointments as directed by your health care provider.   Keep all physical therapy appointments as directed by your health care provider.   Make any needed adjustments to your workstation to promote good posture.   Avoid positions and activities that make your symptoms worse.   Warm up and stretch before being active to help prevent problems.  SEEK MEDICAL CARE IF:   Your pain is not controlled with medicine.   You are unable to decrease your pain medicine over time as planned.   Your activity level is not improving as expected.  SEEK IMMEDIATE MEDICAL CARE IF:   You develop any bleeding.  You develop stomach upset.  You have signs of an allergic reaction to your medicine.   Your symptoms get worse.   You develop new, unexplained symptoms.   You have numbness, tingling, weakness, or paralysis in any part of your body.  MAKE SURE YOU:   Understand these instructions.  Will watch your condition.  Will get help right away if you are not doing well or get worse.   This information is not intended to replace advice given to you by your health care provider. Make sure you discuss any questions you have with your health care provider.   Document Released: 01/20/2007 Document Revised: 03/30/2013 Document Reviewed: 09/30/2012 Elsevier Interactive Patient Education 2016 [employer] Migraine Headache A migraine headache is an intense, throbbing pain on one or both sides of your head. A migraine can last for 30 minutes to several hours. CAUSES    The exact cause of a migraine headache is not always known. However, a migraine may be caused when nerves in the brain become irritated and release chemicals that cause inflammation. This causes pain. Certain things may also trigger migraines, such as:  Alcohol.  Smoking.  Stress.  Menstruation.  Aged cheeses.  Foods or drinks that contain nitrates, glutamate, aspartame, or tyramine.  Lack of sleep.  Chocolate.  Caffeine.  Hunger.  Physical exertion.  Fatigue.  Medicines used to treat chest pain (nitroglycerine), birth control pills, estrogen, and some blood pressure medicines. SIGNS AND SYMPTOMS  Pain on one or both sides of your head.  Pulsating or throbbing pain.  Severe pain that prevents daily activities.  Pain that is aggravated by any physical activity.  Nausea, vomiting, or both.  Dizziness.  Pain with exposure to bright lights, loud noises, or activity.  General sensitivity to bright lights, loud noises, or smells. Before you get a migraine, you  may get warning signs that a migraine is coming (aura). An aura may include:  Seeing flashing lights.  Seeing bright spots, halos, or zigzag lines.  Having tunnel vision or blurred vision.  Having feelings of numbness or tingling.  Having trouble talking.  Having muscle weakness. DIAGNOSIS  A migraine headache is often diagnosed based on:  Symptoms.  Physical exam.  A CT scan or MRI of your head. These imaging tests cannot diagnose migraines, but they can help rule out other causes of headaches. TREATMENT Medicines may be given for pain and nausea. Medicines can also be given to help prevent recurrent migraines.  HOME CARE INSTRUCTIONS  Only take over-the-counter or prescription medicines for pain or discomfort as directed by your health care provider. The use of long-term narcotics is not recommended.  Lie down in a dark, quiet room when you have a migraine.  Keep a journal to find out what  may trigger your migraine headaches. For example, write down:  What you eat and drink.  How much sleep you get.  Any change to your diet or medicines.  Limit alcohol consumption.  Quit smoking if you smoke.  Get 7-9 hours of sleep, or as recommended by your health care provider.  Limit stress.  Keep lights dim if bright lights bother you and make your migraines worse. SEEK IMMEDIATE MEDICAL CARE IF:   Your migraine becomes severe.  You have a fever.  You have a stiff neck.  You have vision loss.  You have muscular weakness or loss of muscle control.  You start losing your balance or have trouble walking.  You feel faint or pass out.  You have severe symptoms that are different from your first symptoms. MAKE SURE YOU:   Understand these instructions.  Will watch your condition.  Will get help right away if you are not doing well or get worse.   This information is not intended to replace advice given to you by your health care provider. Make sure you discuss any questions you have with your health care provider.   Document Released: 03/25/2005 Document Revised: 04/15/2014 Document Reviewed: 11/30/2012 Elsevier Interactive Patient Education 2016 [employer]. Preterm Labor Information Preterm labor is when labor starts at less than 37 weeks of pregnancy. The normal length of a pregnancy is 39 to 41 weeks. CAUSES Often, there is no identifiable underlying cause as to why a woman goes into preterm labor. One of the most common known causes of preterm labor is infection. Infections of the uterus, cervix, vagina, amniotic sac, bladder, kidney, or even the lungs (pneumonia) can cause labor to start. Other suspected causes of preterm labor include:   Urogenital infections, such as yeast infections and bacterial vaginosis.   Uterine abnormalities (uterine shape, uterine septum, fibroids, or bleeding from the placenta).   A cervix that has been operated on (it may fail  to stay closed).   Malformations in the fetus.   Multiple gestations (twins, triplets, and so on).   Breakage of the amniotic sac.  RISK FACTORS  Having a previous history of preterm labor.   Having premature rupture of membranes (PROM).   Having a placenta that covers the opening of the cervix (placenta previa).   Having a placenta that separates from the uterus (placental abruption).   Having a cervix that is too weak to hold the fetus in the uterus (incompetent cervix).   Having too much fluid in the amniotic sac (polyhydramnios).   Taking illegal drugs or smoking while  pregnant.   Not gaining enough weight while pregnant.   Being younger than 77 and older than 24 years old.   Having a low socioeconomic status.   Being African American. SYMPTOMS Signs and symptoms of preterm labor include:   Menstrual-like cramps, abdominal pain, or back pain.  Uterine contractions that are regular, as frequent as six in an hour, regardless of their intensity (may be mild or painful).  Contractions that start on the top of the uterus and spread down to the lower abdomen and back.   A sense of increased pelvic pressure.   A watery or bloody mucus discharge that comes from the vagina.  TREATMENT Depending on the length of the pregnancy and other circumstances, your health care provider may suggest bed rest. If necessary, there are medicines that can be given to stop contractions and to mature the fetal lungs. If labor happens before 34 weeks of pregnancy, a prolonged hospital stay may be recommended. Treatment depends on the condition of both you and the fetus.  WHAT SHOULD YOU DO IF YOU THINK YOU ARE IN PRETERM LABOR? Call your health care provider right away. You will need to go to the hospital to get checked immediately. HOW CAN YOU PREVENT PRETERM LABOR IN FUTURE PREGNANCIES? You should:   Stop smoking if you smoke.  Maintain healthy weight gain and avoid  chemicals and drugs that are not necessary.  Be watchful for any type of infection.  Inform your health care provider if you have a known history of preterm labor.   This information is not intended to replace advice given to you by your health care provider. Make sure you discuss any questions you have with your health care provider.   Document Released: 06/15/2003 Document Revised: 11/25/2012 Document Reviewed: 04/27/2012 Elsevier Interactive Patient Education [employer]

## 2015-02-16 ENCOUNTER — Encounter: Payer: Medicaid Other | Admitting: Family Medicine

## 2015-02-20 ENCOUNTER — Encounter: Payer: Self-pay | Admitting: Family Medicine

## 2015-02-20 ENCOUNTER — Ambulatory Visit (INDEPENDENT_AMBULATORY_CARE_PROVIDER_SITE_OTHER): Payer: Medicaid Other | Admitting: Family Medicine

## 2015-02-20 VITALS — BP 129/73 | HR 105 | Temp 98.4°F | Wt 248.9 lb

## 2015-02-20 DIAGNOSIS — O0993 Supervision of high risk pregnancy, unspecified, third trimester: Secondary | ICD-10-CM | POA: Diagnosis not present

## 2015-02-20 DIAGNOSIS — O163 Unspecified maternal hypertension, third trimester: Secondary | ICD-10-CM

## 2015-02-20 LAB — POCT URINALYSIS DIP (DEVICE)
Bilirubin Urine: NEGATIVE
Glucose, UA: NEGATIVE mg/dL
Hgb urine dipstick: NEGATIVE
Ketones, ur: NEGATIVE mg/dL
Nitrite: NEGATIVE
Protein, ur: NEGATIVE mg/dL
Specific Gravity, Urine: 1.025 (ref 1.005–1.030)
Urobilinogen, UA: 0.2 mg/dL (ref 0.0–1.0)
pH: 6 (ref 5.0–8.0)

## 2015-02-20 NOTE — Patient Instructions (Signed)
Third Trimester of Pregnancy The third trimester is from week 29 through week 42, months 7 through 9. The third trimester is a time when the fetus is growing rapidly. At the end of the ninth month, the fetus is about 20 inches in length and weighs 6-10 pounds.  BODY CHANGES Your body goes through many changes during pregnancy. The changes vary from woman to woman.   Your weight will continue to increase. You can expect to gain 25-35 pounds (11-16 kg) by the end of the pregnancy.  You may begin to get stretch marks on your hips, abdomen, and breasts.  You may urinate more often because the fetus is moving lower into your pelvis and pressing on your bladder.  You may develop or continue to have heartburn as a result of your pregnancy.  You may develop constipation because certain hormones are causing the muscles that push waste through your intestines to slow down.  You may develop hemorrhoids or swollen, bulging veins (varicose veins).  You may have pelvic pain because of the weight gain and pregnancy hormones relaxing your joints between the bones in your pelvis. Backaches may result from overexertion of the muscles supporting your posture.  You may have changes in your hair. These can include thickening of your hair, rapid growth, and changes in texture. Some women also have hair loss during or after pregnancy, or hair that feels dry or thin. Your hair will most likely return to normal after your baby is born.  Your breasts will continue to grow and be tender. A yellow discharge may leak from your breasts called colostrum.  Your belly button may stick out.  You may feel short of breath because of your expanding uterus.  You may notice the fetus "dropping," or moving lower in your abdomen.  You may have a bloody mucus discharge. This usually occurs a few days to a week before labor begins.  Your cervix becomes thin and soft (effaced) near your due date. WHAT TO EXPECT AT YOUR  PRENATAL EXAMS  You will have prenatal exams every 2 weeks until week 36. Then, you will have weekly prenatal exams. During a routine prenatal visit:  You will be weighed to make sure you and the fetus are growing normally.  Your blood pressure is taken.  Your abdomen will be measured to track your baby's growth.  The fetal heartbeat will be listened to.  Any test results from the previous visit will be discussed.  You may have a cervical check near your due date to see if you have effaced. At around 36 weeks, your caregiver will check your cervix. At the same time, your caregiver will also perform a test on the secretions of the vaginal tissue. This test is to determine if a type of bacteria, Group B streptococcus, is present. Your caregiver will explain this further. Your caregiver may ask you:  What your birth plan is.  How you are feeling.  If you are feeling the baby move.  If you have had any abnormal symptoms, such as leaking fluid, bleeding, severe headaches, or abdominal cramping.  If you are using any tobacco products, including cigarettes, chewing tobacco, and electronic cigarettes.  If you have any questions. Other tests or screenings that may be performed during your third trimester include:  Blood tests that check for low iron levels (anemia).  Fetal testing to check the health, activity level, and growth of the fetus. Testing is done if you have certain medical conditions or if   there are problems during the pregnancy.  HIV (human immunodeficiency virus) testing. If you are at high risk, you may be screened for HIV during your third trimester of pregnancy. FALSE LABOR You may feel small, irregular contractions that eventually go away. These are called Braxton Hicks contractions, or false labor. Contractions may last for hours, days, or even weeks before true labor sets in. If contractions come at regular intervals, intensify, or become painful, it is best to be seen  by your caregiver.  SIGNS OF LABOR   Menstrual-like cramps.  Contractions that are 5 minutes apart or less.  Contractions that start on the top of the uterus and spread down to the lower abdomen and back.  A sense of increased pelvic pressure or back pain.  A watery or bloody mucus discharge that comes from the vagina. If you have any of these signs before the 37th week of pregnancy, call your caregiver right away. You need to go to the hospital to get checked immediately. HOME CARE INSTRUCTIONS   Avoid all smoking, herbs, alcohol, and unprescribed drugs. These chemicals affect the formation and growth of the baby.  Do not use any tobacco products, including cigarettes, chewing tobacco, and electronic cigarettes. If you need help quitting, ask your health care provider. You may receive counseling support and other resources to help you quit.  Follow your caregiver's instructions regarding medicine use. There are medicines that are either safe or unsafe to take during pregnancy.  Exercise only as directed by your caregiver. Experiencing uterine cramps is a good sign to stop exercising.  Continue to eat regular, healthy meals.  Wear a good support bra for breast tenderness.  Do not use hot tubs, steam rooms, or saunas.  Wear your seat belt at all times when driving.  Avoid raw meat, uncooked cheese, cat litter boxes, and soil used by cats. These carry germs that can cause birth defects in the baby.  Take your prenatal vitamins.  Take 1500-2000 mg of calcium daily starting at the 20th week of pregnancy until you deliver your baby.  Try taking a stool softener (if your caregiver approves) if you develop constipation. Eat more high-fiber foods, such as fresh vegetables or fruit and whole grains. Drink plenty of fluids to keep your urine clear or pale yellow.  Take warm sitz baths to soothe any pain or discomfort caused by hemorrhoids. Use hemorrhoid cream if your caregiver  approves.  If you develop varicose veins, wear support hose. Elevate your feet for 15 minutes, 3-4 times a day. Limit salt in your diet.  Avoid heavy lifting, wear low heal shoes, and practice good posture.  Rest a lot with your legs elevated if you have leg cramps or low back pain.  Visit your dentist if you have not gone during your pregnancy. Use a soft toothbrush to brush your teeth and be gentle when you floss.  A sexual relationship may be continued unless your caregiver directs you otherwise.  Do not travel far distances unless it is absolutely necessary and only with the approval of your caregiver.  Take prenatal classes to understand, practice, and ask questions about the labor and delivery.  Make a trial run to the hospital.  Pack your hospital bag.  Prepare the baby's nursery.  Continue to go to all your prenatal visits as directed by your caregiver. SEEK MEDICAL CARE IF:  You are unsure if you are in labor or if your water has broken.  You have dizziness.  You have   mild pelvic cramps, pelvic pressure, or nagging pain in your abdominal area.  You have persistent nausea, vomiting, or diarrhea.  You have a bad smelling vaginal discharge.  You have pain with urination. SEEK IMMEDIATE MEDICAL CARE IF:   You have a fever.  You are leaking fluid from your vagina.  You have spotting or bleeding from your vagina.  You have severe abdominal cramping or pain.  You have rapid weight loss or gain.  You have shortness of breath with chest pain.  You notice sudden or extreme swelling of your face, hands, ankles, feet, or legs.  You have not felt your baby move in over an hour.  You have severe headaches that do not go away with medicine.  You have vision changes.   This information is not intended to replace advice given to you by your health care provider. Make sure you discuss any questions you have with your health care provider.   Document Released:  03/19/2001 Document Revised: 04/15/2014 Document Reviewed: 05/26/2012 Elsevier Interactive Patient Education 2016 [employer]  Breastfeeding Deciding to breastfeed is one of the best choices you can make for you and your baby. A change in hormones during pregnancy causes your breast tissue to grow and increases the number and size of your milk ducts. These hormones also allow proteins, sugars, and fats from your blood supply to make breast milk in your milk-producing glands. Hormones prevent breast milk from being released before your baby is born as well as prompt milk flow after birth. Once breastfeeding has begun, thoughts of your baby, as well as his or her sucking or crying, can stimulate the release of milk from your milk-producing glands.  BENEFITS OF BREASTFEEDING For Your Baby  Your first milk (colostrum) helps your baby's digestive system function better.  There are antibodies in your milk that help your baby fight off infections.  Your baby has a lower incidence of asthma, allergies, and sudden infant death syndrome.  The nutrients in breast milk are better for your baby than infant formulas and are designed uniquely for your baby's needs.  Breast milk improves your baby's brain development.  Your baby is less likely to develop other conditions, such as childhood obesity, asthma, or type 2 diabetes mellitus. For You  Breastfeeding helps to create a very special bond between you and your baby.  Breastfeeding is convenient. Breast milk is always available at the correct temperature and costs nothing.  Breastfeeding helps to burn calories and helps you lose the weight gained during pregnancy.  Breastfeeding makes your uterus contract to its prepregnancy size faster and slows bleeding (lochia) after you give birth.   Breastfeeding helps to lower your risk of developing type 2 diabetes mellitus, osteoporosis, and breast or ovarian cancer later in life. SIGNS THAT YOUR BABY IS  HUNGRY Early Signs of Hunger  Increased alertness or activity.  Stretching.  Movement of the head from side to side.  Movement of the head and opening of the mouth when the corner of the mouth or cheek is stroked (rooting).  Increased sucking sounds, smacking lips, cooing, sighing, or squeaking.  Hand-to-mouth movements.  Increased sucking of fingers or hands. Late Signs of Hunger  Fussing.  Intermittent crying. Extreme Signs of Hunger Signs of extreme hunger will require calming and consoling before your baby will be able to breastfeed successfully. Do not wait for the following signs of extreme hunger to occur before you initiate breastfeeding:  Restlessness.  A loud, strong cry.  Screaming.   BREASTFEEDING BASICS Breastfeeding Initiation  Find a comfortable place to sit or lie down, with your neck and back well supported.  Place a pillow or rolled up blanket under your baby to bring him or her to the level of your breast (if you are seated). Nursing pillows are specially designed to help support your arms and your baby while you breastfeed.  Make sure that your baby's abdomen is facing your abdomen.  Gently massage your breast. With your fingertips, massage from your chest wall toward your nipple in a circular motion. This encourages milk flow. You may need to continue this action during the feeding if your milk flows slowly.  Support your breast with 4 fingers underneath and your thumb above your nipple. Make sure your fingers are well away from your nipple and your baby's mouth.  Stroke your baby's lips gently with your finger or nipple.  When your baby's mouth is open wide enough, quickly bring your baby to your breast, placing your entire nipple and as much of the colored area around your nipple (areola) as possible into your baby's mouth.  More areola should be visible above your baby's upper lip than below the lower lip.  Your baby's tongue should be between his  or her lower gum and your breast.  Ensure that your baby's mouth is correctly positioned around your nipple (latched). Your baby's lips should create a seal on your breast and be turned out (everted).  It is common for your baby to suck about 2-3 minutes in order to start the flow of breast milk. Latching Teaching your baby how to latch on to your breast properly is very important. An improper latch can cause nipple pain and decreased milk supply for you and poor weight gain in your baby. Also, if your baby is not latched onto your nipple properly, he or she may swallow some air during feeding. This can make your baby fussy. Burping your baby when you switch breasts during the feeding can help to get rid of the air. However, teaching your baby to latch on properly is still the best way to prevent fussiness from swallowing air while breastfeeding. Signs that your baby has successfully latched on to your nipple:  Silent tugging or silent sucking, without causing you pain.  Swallowing heard between every 3-4 sucks.  Muscle movement above and in front of his or her ears while sucking. Signs that your baby has not successfully latched on to nipple:  Sucking sounds or smacking sounds from your baby while breastfeeding.  Nipple pain. If you think your baby has not latched on correctly, slip your finger into the corner of your baby's mouth to break the suction and place it between your baby's gums. Attempt breastfeeding initiation again. Signs of Successful Breastfeeding Signs from your baby:  A gradual decrease in the number of sucks or complete cessation of sucking.  Falling asleep.  Relaxation of his or her body.  Retention of a small amount of milk in his or her mouth.  Letting go of your breast by himself or herself. Signs from you:  Breasts that have increased in firmness, weight, and size 1-3 hours after feeding.  Breasts that are softer immediately after  breastfeeding.  Increased milk volume, as well as a change in milk consistency and color by the fifth day of breastfeeding.  Nipples that are not sore, cracked, or bleeding. Signs That Your Baby is Getting Enough Milk  Wetting at least 3 diapers in a 24-hour period.   The urine should be clear and pale yellow by age 5 days.  At least 3 stools in a 24-hour period by age 5 days. The stool should be soft and yellow.  At least 3 stools in a 24-hour period by age 7 days. The stool should be seedy and yellow.  No loss of weight greater than 10% of birth weight during the first 3 days of age.  Average weight gain of 4-7 ounces (113-198 g) per week after age 4 days.  Consistent daily weight gain by age 5 days, without weight loss after the age of 2 weeks. After a feeding, your baby may spit up a small amount. This is common. BREASTFEEDING FREQUENCY AND DURATION Frequent feeding will help you make more milk and can prevent sore nipples and breast engorgement. Breastfeed when you feel the need to reduce the fullness of your breasts or when your baby shows signs of hunger. This is called "breastfeeding on demand." Avoid introducing a pacifier to your baby while you are working to establish breastfeeding (the first 4-6 weeks after your baby is born). After this time you may choose to use a pacifier. Research has shown that pacifier use during the first year of a baby's life decreases the risk of sudden infant death syndrome (SIDS). Allow your baby to feed on each breast as long as he or she wants. Breastfeed until your baby is finished feeding. When your baby unlatches or falls asleep while feeding from the first breast, offer the second breast. Because newborns are often sleepy in the first few weeks of life, you may need to awaken your baby to get him or her to feed. Breastfeeding times will vary from baby to baby. However, the following rules can serve as a guide to help you ensure that your baby is  properly fed:  Newborns (babies 4 weeks of age or younger) may breastfeed every 1-3 hours.  Newborns should not go longer than 3 hours during the day or 5 hours during the night without breastfeeding.  You should breastfeed your baby a minimum of 8 times in a 24-hour period until you begin to introduce solid foods to your baby at around 6 months of age. BREAST MILK PUMPING Pumping and storing breast milk allows you to ensure that your baby is exclusively fed your breast milk, even at times when you are unable to breastfeed. This is especially important if you are going back to work while you are still breastfeeding or when you are not able to be present during feedings. Your lactation consultant can give you guidelines on how long it is safe to store breast milk. A breast pump is a machine that allows you to pump milk from your breast into a sterile bottle. The pumped breast milk can then be stored in a refrigerator or freezer. Some breast pumps are operated by hand, while others use electricity. Ask your lactation consultant which type will work best for you. Breast pumps can be purchased, but some hospitals and breastfeeding support groups lease breast pumps on a monthly basis. A lactation consultant can teach you how to hand express breast milk, if you prefer not to use a pump. CARING FOR YOUR BREASTS WHILE YOU BREASTFEED Nipples can become dry, cracked, and sore while breastfeeding. The following recommendations can help keep your breasts moisturized and healthy:  Avoid using soap on your nipples.  Wear a supportive bra. Although not required, special nursing bras and tank tops are designed to allow access to your   breasts for breastfeeding without taking off your entire bra or top. Avoid wearing underwire-style bras or extremely tight bras.  Air dry your nipples for 3-4minutes after each feeding.  Use only cotton bra pads to absorb leaked breast milk. Leaking of breast milk between feedings  is normal.  Use lanolin on your nipples after breastfeeding. Lanolin helps to maintain your skin's normal moisture barrier. If you use pure lanolin, you do not need to wash it off before feeding your baby again. Pure lanolin is not toxic to your baby. You may also hand express a few drops of breast milk and gently massage that milk into your nipples and allow the milk to air dry. In the first few weeks after giving birth, some women experience extremely full breasts (engorgement). Engorgement can make your breasts feel heavy, warm, and tender to the touch. Engorgement peaks within 3-5 days after you give birth. The following recommendations can help ease engorgement:  Completely empty your breasts while breastfeeding or pumping. You may want to start by applying warm, moist heat (in the shower or with warm water-soaked hand towels) just before feeding or pumping. This increases circulation and helps the milk flow. If your baby does not completely empty your breasts while breastfeeding, pump any extra milk after he or she is finished.  Wear a snug bra (nursing or regular) or tank top for 1-2 days to signal your body to slightly decrease milk production.  Apply ice packs to your breasts, unless this is too uncomfortable for you.  Make sure that your baby is latched on and positioned properly while breastfeeding. If engorgement persists after 48 hours of following these recommendations, contact your health care provider or a lactation consultant. OVERALL HEALTH CARE RECOMMENDATIONS WHILE BREASTFEEDING  Eat healthy foods. Alternate between meals and snacks, eating 3 of each per day. Because what you eat affects your breast milk, some of the foods may make your baby more irritable than usual. Avoid eating these foods if you are sure that they are negatively affecting your baby.  Drink milk, fruit juice, and water to satisfy your thirst (about 10 glasses a day).  Rest often, relax, and continue to take  your prenatal vitamins to prevent fatigue, stress, and anemia.  Continue breast self-awareness checks.  Avoid chewing and smoking tobacco. Chemicals from cigarettes that pass into breast milk and exposure to secondhand smoke may harm your baby.  Avoid alcohol and drug use, including marijuana. Some medicines that may be harmful to your baby can pass through breast milk. It is important to ask your health care provider before taking any medicine, including all over-the-counter and prescription medicine as well as vitamin and herbal supplements. It is possible to become pregnant while breastfeeding. If birth control is desired, ask your health care provider about options that will be safe for your baby. SEEK MEDICAL CARE IF:  You feel like you want to stop breastfeeding or have become frustrated with breastfeeding.  You have painful breasts or nipples.  Your nipples are cracked or bleeding.  Your breasts are red, tender, or warm.  You have a swollen area on either breast.  You have a fever or chills.  You have nausea or vomiting.  You have drainage other than breast milk from your nipples.  Your breasts do not become full before feedings by the fifth day after you give birth.  You feel sad and depressed.  Your baby is too sleepy to eat well.  Your baby is having trouble sleeping.     Your baby is wetting less than 3 diapers in a 24-hour period.  Your baby has less than 3 stools in a 24-hour period.  Your baby's skin or the white part of his or her eyes becomes yellow.   Your baby is not gaining weight by 5 days of age. SEEK IMMEDIATE MEDICAL CARE IF:  Your baby is overly tired (lethargic) and does not want to wake up and feed.  Your baby develops an unexplained fever.   This information is not intended to replace advice given to you by your health care provider. Make sure you discuss any questions you have with your health care provider.   Document Released: 03/25/2005  Document Revised: 12/14/2014 Document Reviewed: 09/16/2012 Elsevier Interactive Patient Education 2016 [employer]  

## 2015-02-20 NOTE — Progress Notes (Signed)
Subjective:  Julia Hopkins is a 24 y.o. G2P0010 at 509w6d being seen today for ongoing prenatal care.  Patient reports no complaints.  Contractions: Not present.  Vag. Bleeding: None. Movement: Present. Denies leaking of fluid.   The following portions of the patient's history were reviewed and updated as appropriate: allergies, current medications, past family history, past medical history, past social history, past surgical history and problem list. Problem list updated.  Objective:   Filed Vitals:   02/20/15 0810  BP: 129/73  Pulse: 105  Temp: 98.4 F (36.9 C)  Weight: 248 lb 14.4 oz (112.9 kg)    Fetal Status: Fetal Heart Rate (bpm): 150 Fundal Height: 31 cm Movement: Present     General:  Alert, oriented and cooperative. Patient is in no acute distress.  Skin: Skin is warm and dry. No rash noted.   Cardiovascular: Normal heart rate noted  Respiratory: Normal respiratory effort, no problems with respiration noted  Abdomen: Soft, gravid, appropriate for gestational age. Pain/Pressure: Present     Pelvic: Vag. Bleeding: None     Cervical exam deferred        Extremities: Normal range of motion.  Edema: Mild pitting, slight indentation  Mental Status: Normal mood and affect. Normal behavior. Normal judgment and thought content.   Urinalysis: Urine Protein: Negative Urine Glucose: Negative  Assessment and Plan:  Pregnancy: G2P0010 at 469w6d  1. Supervision of high risk pregnancy, antepartum, third trimester Continue routine prenatal care.  2. Hypertension affecting pregnancy, antepartum, third trimester On no meds--last u/s for growth at 30%--stable BP is well controlled Begin 2x/wk testing at 32 wks for delivery at 40 wks if no meds and BP is stable.  Preterm labor symptoms and general obstetric precautions including but not limited to vaginal bleeding, contractions, leaking of fluid and fetal movement were reviewed in detail with the patient. Please refer to After Visit  Summary for other counseling recommendations.  Return in 10 days (on 03/02/2015) for NST only in 10 days, OB visit and NST 2 wks.   Reva Boresanya S Chavis Tessler, MD

## 2015-02-20 NOTE — Progress Notes (Signed)
Breastfeeding tip of the week reviewed. 

## 2015-02-20 NOTE — Addendum Note (Signed)
Addended by: Sherre LainASH, AMANDA A on: 02/20/2015 08:48 AM   Modules accepted: Orders

## 2015-02-24 LAB — BARBITURATES (GC/LC/MS), URINE
Amobarbital: NEGATIVE ng/mL (ref <100)
Butalbital: 1610 ng/mL — AB (ref <100)
Pentabarbital: NEGATIVE ng/mL (ref <100)
Phenobarbital: NEGATIVE ng/mL (ref <100)
Secobarbital: NEGATIVE ng/mL (ref <100)

## 2015-02-25 LAB — PRESCRIPTION MONITORING PROFILE (19 PANEL)
Amphetamine/Meth: NEGATIVE ng/mL
Benzodiazepine Screen, Urine: NEGATIVE ng/mL
Buprenorphine, Urine: NEGATIVE ng/mL
Cannabinoid Scrn, Ur: NEGATIVE ng/mL
Carisoprodol, Urine: NEGATIVE ng/mL
Cocaine Metabolites: NEGATIVE ng/mL
Creatinine, Urine: 134.34 mg/dL (ref >20.0)
Fentanyl, Ur: NEGATIVE ng/mL
MDMA URINE: NEGATIVE ng/mL
Meperidine, Ur: NEGATIVE ng/mL
Methadone Screen, Urine: NEGATIVE ng/mL
Methaqualone: NEGATIVE ng/mL
Nitrites, Initial: NEGATIVE ug/mL
Opiate Screen, Urine: NEGATIVE ng/mL
Oxycodone Screen, Ur: NEGATIVE ng/mL
Phencyclidine, Ur: NEGATIVE ng/mL
Propoxyphene: NEGATIVE ng/mL
Tapentadol, urine: NEGATIVE ng/mL
Tramadol Scrn, Ur: NEGATIVE ng/mL
Zolpidem, Urine: NEGATIVE ng/mL
pH, Initial: 6.1 pH (ref 4.5–8.9)

## 2015-02-28 ENCOUNTER — Other Ambulatory Visit: Payer: Self-pay | Admitting: *Deleted

## 2015-02-28 DIAGNOSIS — O163 Unspecified maternal hypertension, third trimester: Secondary | ICD-10-CM

## 2015-03-01 ENCOUNTER — Ambulatory Visit (INDEPENDENT_AMBULATORY_CARE_PROVIDER_SITE_OTHER): Payer: Medicaid Other | Admitting: *Deleted

## 2015-03-01 VITALS — BP 123/67 | HR 99

## 2015-03-01 DIAGNOSIS — O163 Unspecified maternal hypertension, third trimester: Secondary | ICD-10-CM | POA: Diagnosis present

## 2015-03-01 LAB — POCT URINALYSIS DIP (DEVICE)
Bilirubin Urine: NEGATIVE
Glucose, UA: NEGATIVE mg/dL
Hgb urine dipstick: NEGATIVE
Ketones, ur: NEGATIVE mg/dL
Nitrite: NEGATIVE
Protein, ur: NEGATIVE mg/dL
Specific Gravity, Urine: 1.025 (ref 1.005–1.030)
Urobilinogen, UA: 0.2 mg/dL (ref 0.0–1.0)
pH: 6.5 (ref 5.0–8.0)

## 2015-03-01 NOTE — Progress Notes (Signed)
NST reviewed and reactive.  

## 2015-03-03 ENCOUNTER — Ambulatory Visit (HOSPITAL_COMMUNITY): Payer: Medicaid Other

## 2015-03-06 ENCOUNTER — Other Ambulatory Visit: Payer: Medicaid Other

## 2015-03-09 ENCOUNTER — Ambulatory Visit (HOSPITAL_COMMUNITY)
Admission: RE | Admit: 2015-03-09 | Discharge: 2015-03-09 | Disposition: A | Discharge status: Home / Self Care | Payer: Medicaid Other | Source: Ambulatory Visit | Attending: Family Medicine | Admitting: Family Medicine

## 2015-03-09 ENCOUNTER — Encounter (HOSPITAL_COMMUNITY): Payer: Self-pay

## 2015-03-09 ENCOUNTER — Other Ambulatory Visit (HOSPITAL_COMMUNITY): Payer: Self-pay | Admitting: Obstetrics and Gynecology

## 2015-03-09 ENCOUNTER — Other Ambulatory Visit: Payer: Self-pay | Admitting: Family Medicine

## 2015-03-09 ENCOUNTER — Other Ambulatory Visit: Payer: Self-pay | Admitting: General Practice

## 2015-03-09 ENCOUNTER — Ambulatory Visit (INDEPENDENT_AMBULATORY_CARE_PROVIDER_SITE_OTHER): Payer: Medicaid Other | Admitting: Obstetrics & Gynecology

## 2015-03-09 VITALS — BP 136/80 | HR 89 | Wt 252.8 lb

## 2015-03-09 DIAGNOSIS — O99333 Smoking (tobacco) complicating pregnancy, third trimester: Secondary | ICD-10-CM | POA: Diagnosis not present

## 2015-03-09 DIAGNOSIS — O163 Unspecified maternal hypertension, third trimester: Secondary | ICD-10-CM | POA: Diagnosis present

## 2015-03-09 DIAGNOSIS — O99213 Obesity complicating pregnancy, third trimester: Secondary | ICD-10-CM

## 2015-03-09 DIAGNOSIS — O2693 Pregnancy related conditions, unspecified, third trimester: Secondary | ICD-10-CM | POA: Insufficient documentation

## 2015-03-09 DIAGNOSIS — Z3A33 33 weeks gestation of pregnancy: Secondary | ICD-10-CM

## 2015-03-09 DIAGNOSIS — O169 Unspecified maternal hypertension, unspecified trimester: Secondary | ICD-10-CM

## 2015-03-09 DIAGNOSIS — O2690 Pregnancy related conditions, unspecified, unspecified trimester: Secondary | ICD-10-CM

## 2015-03-09 DIAGNOSIS — O10013 Pre-existing essential hypertension complicating pregnancy, third trimester: Secondary | ICD-10-CM | POA: Diagnosis present

## 2015-03-09 LAB — POCT URINALYSIS DIP (DEVICE)
Bilirubin Urine: NEGATIVE
Glucose, UA: NEGATIVE mg/dL
Hgb urine dipstick: NEGATIVE
Ketones, ur: NEGATIVE mg/dL
Leukocytes, UA: NEGATIVE
Nitrite: NEGATIVE
Protein, ur: NEGATIVE mg/dL
Specific Gravity, Urine: >=1.03 (ref 1.005–1.030)
Urobilinogen, UA: 0.2 mg/dL (ref 0.0–1.0)
pH: 5.5 (ref 5.0–8.0)

## 2015-03-09 NOTE — Progress Notes (Signed)
Subjective:  Julia Hopkins is a 24 y.o. G2P0010 at 3451w2d being seen today for ongoing prenatal care.  She is currently monitored for the following issues for this high-risk pregnancy and has Supervision of high risk pregnancy, antepartum; Hypertension; Hypertension affecting pregnancy, antepartum; Substance abuse affecting pregnancy, antepartum; Tobacco smoking affecting pregnancy, antepartum; Obesity (BMI 30-39.9); Obesity affecting pregnancy in third trimester; Chlamydia infection affecting pregnancy, antepartum; and History of CVA (cerebrovascular accident) on her problem list.  Patient reports no complaints.  Contractions: Not present. Vag. Bleeding: None.  Movement: Present. Denies leaking of fluid.   The following portions of the patient's history were reviewed and updated as appropriate: allergies, current medications, past family history, past medical history, past social history, past surgical history and problem list. Problem list updated.  Objective:   Filed Vitals:   03/09/15 0840  BP: 136/80  Pulse: 89  Weight: 252 lb 12.8 oz (114.669 kg)    Fetal Status:     Movement: Present     General:  Alert, oriented and cooperative. Patient is in no acute distress.  Skin: Skin is warm and dry. No rash noted.   Cardiovascular: Normal heart rate noted  Respiratory: Normal respiratory effort, no problems with respiration noted  Abdomen: Soft, gravid, appropriate for gestational age. Pain/Pressure: Present     Pelvic: Vag. Bleeding: None     Cervical exam deferred        Extremities: Normal range of motion.  Edema: Mild pitting, slight indentation  Mental Status: Normal mood and affect. Normal behavior. Normal judgment and thought content.   Urinalysis:      Assessment and Plan:  Pregnancy: G2P0010 at 5551w2d  There are no diagnoses linked to this encounter. Preterm labor symptoms and general obstetric precautions including but not limited to vaginal bleeding, contractions, leaking of  fluid and fetal movement were reviewed in detail with the patient. Please refer to After Visit Summary for other counseling recommendations.  NST and BPP as scheduled  Adam PhenixJames G Tavaris Eudy, MD

## 2015-03-13 ENCOUNTER — Ambulatory Visit: Payer: Medicaid Other | Admitting: *Deleted

## 2015-03-13 ENCOUNTER — Other Ambulatory Visit: Payer: Self-pay | Admitting: *Deleted

## 2015-03-13 ENCOUNTER — Encounter (HOSPITAL_COMMUNITY): Payer: Self-pay | Admitting: *Deleted

## 2015-03-13 ENCOUNTER — Inpatient Hospital Stay (HOSPITAL_COMMUNITY)
Admission: AD | Admit: 2015-03-13 | Discharge: 2015-03-13 | Disposition: A | Discharge status: Home / Self Care | Payer: Medicaid Other | Source: Ambulatory Visit | Attending: Obstetrics & Gynecology | Admitting: Obstetrics & Gynecology

## 2015-03-13 DIAGNOSIS — O163 Unspecified maternal hypertension, third trimester: Secondary | ICD-10-CM

## 2015-03-13 DIAGNOSIS — O0993 Supervision of high risk pregnancy, unspecified, third trimester: Secondary | ICD-10-CM

## 2015-03-13 DIAGNOSIS — O4703 False labor before 37 completed weeks of gestation, third trimester: Secondary | ICD-10-CM | POA: Diagnosis not present

## 2015-03-13 DIAGNOSIS — O479 False labor, unspecified: Secondary | ICD-10-CM

## 2015-03-13 DIAGNOSIS — Z3A33 33 weeks gestation of pregnancy: Secondary | ICD-10-CM | POA: Diagnosis not present

## 2015-03-13 DIAGNOSIS — A749 Chlamydial infection, unspecified: Secondary | ICD-10-CM

## 2015-03-13 DIAGNOSIS — O99332 Smoking (tobacco) complicating pregnancy, second trimester: Secondary | ICD-10-CM

## 2015-03-13 DIAGNOSIS — O98812 Other maternal infectious and parasitic diseases complicating pregnancy, second trimester: Secondary | ICD-10-CM

## 2015-03-13 DIAGNOSIS — O99213 Obesity complicating pregnancy, third trimester: Secondary | ICD-10-CM

## 2015-03-13 DIAGNOSIS — O99320 Drug use complicating pregnancy, unspecified trimester: Secondary | ICD-10-CM

## 2015-03-13 LAB — URINALYSIS, ROUTINE W REFLEX MICROSCOPIC
Bilirubin Urine: NEGATIVE
Glucose, UA: 100 mg/dL — AB
Hgb urine dipstick: NEGATIVE
Ketones, ur: 15 mg/dL — AB
Leukocytes, UA: NEGATIVE
Nitrite: NEGATIVE
Protein, ur: NEGATIVE mg/dL
Specific Gravity, Urine: 1.025 (ref 1.005–1.030)
pH: 6 (ref 5.0–8.0)

## 2015-03-13 LAB — GC/CHLAMYDIA PROBE AMP (~~LOC~~) NOT AT ARMC
Chlamydia: NEGATIVE
Neisseria Gonorrhea: NEGATIVE

## 2015-03-13 NOTE — MAU Provider Note (Signed)
History     CSN: [id number]  Arrival date and time: 03/13/15 0244   First Provider Initiated Contact with Patient 03/13/15 0258      Chief Complaint  Patient presents with  . Abdominal Pain   HPI  Patient states that she is here due to the presence of contractions. Patient states that right after she had sex with her boyfriend, she had some contractions. She states she had 2 contractions within 10 minutes. She called EMS, and felt several more contractions between then and arrival at the MAU. She has since stopped having contractions. Denies + FM , NO LOF, negative for vaginal bleeding.   OB History    Gravida Para Term Preterm AB TAB SAB Ectopic Multiple Living   Past Medical History  Diagnosis Date  . Hypertension   . Asthma     exercise induced last rescue use 9/18  . Depression   . Anxiety and depression   . Hx of suicide attempt december 2015-xanax OD; july 2015 cut throat    Past Surgical History  Procedure Laterality Date  . Wisdom tooth extraction      x 4  . Dilation and curettage of uterus      x 1    Family History  Problem Relation Age of Onset  . Healthy Sister     Social History  Substance Use Topics  . Smoking status: Current Every Day Smoker -- 0.25 packs/day for 11 years    Types: Cigarettes  . Smokeless tobacco: None  . Alcohol Use: No    Allergies: No Known Allergies  Prescriptions prior to admission  Medication Sig Dispense Refill Last Dose  . Acetaminophen (TYLENOL PO) Take 1 tablet by mouth every 6 (six) hours as needed (migraine).   Past Month at Unknown time  . albuterol (PROVENTIL HFA;VENTOLIN HFA) 108 (90 BASE) MCG/ACT inhaler Inhale 2 puffs into the lungs every 6 (six) hours as needed for wheezing or shortness of breath. 1 Inhaler 2 03/12/2015 at Unknown time  . aspirin EC 81 MG tablet Take 1 tablet (81 mg total) by mouth daily. 30 tablet 3 03/12/2015 at Unknown time  . butalbital-acetaminophen-caffeine  (FIORICET) 50-325-40 MG tablet Take 1-2 tablets by mouth every 6 (six) hours as needed for migraine. 20 tablet 0 03/12/2015 at 1500  . cyclobenzaprine (FLEXERIL) 5 MG tablet Take 1 tablet (5 mg total) by mouth 3 (three) times daily as needed for muscle spasms. 30 tablet 0 03/12/2015 at 2200  . Fluticasone-Salmeterol (ADVAIR DISKUS) 100-50 MCG/DOSE AEPB Inhale 1 puff into the lungs 2 (two) times daily. 60 each 3 03/12/2015 at Unknown time  . Prenat-FeFum-FePo-FA-Omega 3 (CONCEPT DHA) 53.5-38-1 MG CAPS Take 1 tablet by mouth daily. 30 capsule 2 03/12/2015 at Unknown time  . docusate sodium (COLACE) 250 MG capsule Take 1 capsule (250 mg total) by mouth 2 (two) times daily. 60 capsule 11 03/07/2015  . Fluticasone Propionate HFA (FLOVENT HFA IN) Inhale 1 puff into the lungs every 4 (four) hours as needed (rescue).   Not Taking    ROS Physical Exam   Blood pressure 114/64, pulse 95, temperature 97.9 F (36.6 C), temperature source Oral, resp. rate 20, height  (1.575 m), weight 252 lb 8 oz (114.533 kg), last menstrual period 07/03/2014.  Physical Exam  Constitutional: She is oriented to person, place, and time. She appears well-developed and well-nourished.  Cardiovascular: Normal rate, regular rhythm and  normal heart sounds.  Exam reveals no gallop and no friction rub.   No murmur heard. Respiratory: Effort normal and breath sounds normal. No respiratory distress. She has no wheezes. She has no rales.  GI: Soft. Bowel sounds are normal. She exhibits no distension. There is no tenderness.  Musculoskeletal: Normal range of motion. She exhibits no edema or tenderness.  Neurological: She is alert and oriented to person, place, and time.  Skin: Skin is warm and dry.   FHT:  FHR: 135 bpm, variability: moderate,  accelerations: present,  decelerations:  None    MAU Course  Procedures  Results for Julia Hopkins, Julia Hopkins (MRN [id number]) as of 03/13/2015 04:14  Ref. Range 03/13/2015 02:51  Appearance Latest Ref  Range: CLEAR  CLEAR  Bilirubin Urine Latest Ref Range: NEGATIVE  NEGATIVE  Color, Urine Latest Ref Range: YELLOW  YELLOW  Glucose Latest Ref Range: NEGATIVE mg/dL 865100 (A)  Hgb urine dipstick Latest Ref Range: NEGATIVE  NEGATIVE  Ketones, ur Latest Ref Range: NEGATIVE mg/dL 15 (A)  Leukocytes, UA Latest Ref Range: NEGATIVE  NEGATIVE  Nitrite Latest Ref Range: NEGATIVE  NEGATIVE  pH Latest Ref Range: 5.0-8.0  6.0  Protein Latest Ref Range: NEGATIVE mg/dL NEGATIVE  Specific Gravity, Urine Latest Ref Range: 1.005-1.030  1.025    Assessment and Plan   Julia Hopkins is 24 y.o.  G2P0010 @ 135w6d presenting with hx of irregular contractions post-coitus this is likely ALL[employer] CorporationBraxton Hicks Contractions.  - No contractions on the monitor  - FHT - Category 1.  - UA negative for nitrates or hgb.  - Hx of chlamydia in 2nd trimester, therefore will get G/C urine  Julia Hopkins 03/13/2015, 3:45 AM

## 2015-03-13 NOTE — Discharge Instructions (Signed)
° °Abdominal Pain During Pregnancy °Belly (abdominal) pain is common during pregnancy. Most of the time, it is not a serious problem. Other times, it can be a sign that something is wrong with the pregnancy. Always tell your doctor if you have belly pain. °HOME CARE °Monitor your belly pain for any changes. The following actions may help you feel better: °· Do not have sex (intercourse) or put anything in your vagina until you feel better. °· Rest until your pain stops. °· Drink clear fluids if you feel sick to your stomach (nauseous). Do not eat solid food until you feel better. °· Only take medicine as told by your doctor. °· Keep all doctor visits as told. °GET HELP RIGHT AWAY IF:  °· You are bleeding, leaking fluid, or pieces of tissue come out of your vagina. °· You have more pain or cramping. °· You keep throwing up (vomiting). °· You have pain when you pee (urinate) or have blood in your pee. °· You have a fever. °· You do not feel your baby moving as much. °· You feel very weak or feel like passing out. °· You have trouble breathing, with or without belly pain. °· You have a very bad headache and belly pain. °· You have fluid leaking from your vagina and belly pain. °· You keep having watery poop (diarrhea). °· Your belly pain does not go away after resting, or the pain gets worse. °MAKE SURE YOU:  °· Understand these instructions. °· Will watch your condition. °· Will get help right away if you are not doing well or get worse. °  °This information is not intended to replace advice given to you by your health care provider. Make sure you discuss any questions you have with your health care provider. °  °Document Released: 03/13/2009 Document Revised: 11/25/2012 Document Reviewed: 10/22/2012 °Elsevier Interactive Patient Education ©2016 Elsevier Inc. °Braxton Hicks Contractions °Contractions of the uterus can occur throughout pregnancy. Contractions are not always a sign that you are in labor.  °WHAT ARE  BRAXTON HICKS CONTRACTIONS?  °Contractions that occur before labor are called Braxton Hicks contractions, or false labor. Toward the end of pregnancy (32-34 weeks), these contractions can develop more often and may become more forceful. This is not true labor because these contractions do not result in opening (dilatation) and thinning of the cervix. They are sometimes difficult to tell apart from true labor because these contractions can be forceful and people have different pain tolerances. You should not feel embarrassed if you go to the hospital with false labor. Sometimes, the only way to tell if you are in true labor is for your health care provider to look for changes in the cervix. °If there are no prenatal problems or other health problems associated with the pregnancy, it is completely safe to be sent home with false labor and await the onset of true labor. °HOW CAN YOU TELL THE DIFFERENCE BETWEEN TRUE AND FALSE LABOR? °False Labor °· The contractions of false labor are usually shorter and not as hard as those of true labor.   °· The contractions are usually irregular.   °· The contractions are often felt in the front of the lower abdomen and in the groin.   °· The contractions may go away when you walk around or change positions while lying down.   °· The contractions get weaker and are shorter lasting as time goes on.   °· The contractions do not usually become progressively stronger, regular, and closer together as with   true labor.  True Labor  Contractions in true labor last 30-70 seconds, become very regular, usually become more intense, and increase in frequency.   The contractions do not go away with walking.   The discomfort is usually felt in the top of the uterus and spreads to the lower abdomen and low back.   True labor can be determined by your health care provider with an exam. This will show that the cervix is dilating and getting thinner.  WHAT TO REMEMBER  Keep up with  your usual exercises and follow other instructions given by your health care provider.   Take medicines as directed by your health care provider.   Keep your regular prenatal appointments.   Eat and drink lightly if you think you are going into labor.   If Braxton Hicks contractions are making you uncomfortable:   Change your position from lying down or resting to walking, or from walking to resting.   Sit and rest in a tub of warm water.   Drink 2-3 glasses of water. Dehydration may cause these contractions.   Do slow and deep breathing several times an hour.  WHEN SHOULD I SEEK IMMEDIATE MEDICAL CARE? Seek immediate medical care if:  Your contractions become stronger, more regular, and closer together.   You have fluid leaking or gushing from your vagina.   You have a fever.   You pass blood-tinged mucus.   You have vaginal bleeding.   You have continuous abdominal pain.   You have low back pain that you never had before.   You feel your baby's head pushing down and causing pelvic pressure.   Your baby is not moving as much as it used to.    This information is not intended to replace advice given to you by your health care provider. Make sure you discuss any questions you have with your health care provider.   Document Released: 03/25/2005 Document Revised: 03/30/2013 Document Reviewed: 01/04/2013 Elsevier Interactive Patient Education [employer]2016 Elsevier Inc.

## 2015-03-13 NOTE — MAU Note (Signed)
Pain in lower stomach x 3 days, intercourse tonight leaking something

## 2015-03-13 NOTE — MAU Note (Addendum)
PT  HAS  ARRIVED  VIA  EMS-   [facility]  -  IN  [facility]     SAYS  SHE CALLED HERE AT 0130-   SAYS HAVING PAIN  ON RIGHT  SIDE  OF  ABD  AND  FELT  BABY KICKING  .   NO VE  IN  CLINIC.   HAD U/S  ON Thursday -  ALL OK.  8/8.    SAYS HAD  SEX AT 0100  .       DID NOT  TAKE  ANY MED  -  SOMETIMES  TAKES  FLEXERIL

## 2015-03-13 NOTE — Progress Notes (Signed)
Pt was seen @ MAU during the night due to contractions and had reactive FHR tracing.  NST cancelled for today.  She reports good FM and will keep next appt as scheduled on 12/8.

## 2015-03-16 ENCOUNTER — Encounter: Payer: Self-pay | Admitting: Obstetrics & Gynecology

## 2015-03-16 ENCOUNTER — Ambulatory Visit (INDEPENDENT_AMBULATORY_CARE_PROVIDER_SITE_OTHER): Payer: Medicaid Other | Admitting: Obstetrics & Gynecology

## 2015-03-16 VITALS — BP 129/76 | HR 94 | Wt 255.5 lb

## 2015-03-16 DIAGNOSIS — O9989 Other specified diseases and conditions complicating pregnancy, childbirth and the puerperium: Secondary | ICD-10-CM

## 2015-03-16 DIAGNOSIS — O26893 Other specified pregnancy related conditions, third trimester: Secondary | ICD-10-CM

## 2015-03-16 DIAGNOSIS — Z36 Encounter for antenatal screening of mother: Secondary | ICD-10-CM

## 2015-03-16 DIAGNOSIS — O163 Unspecified maternal hypertension, third trimester: Secondary | ICD-10-CM

## 2015-03-16 DIAGNOSIS — M549 Dorsalgia, unspecified: Secondary | ICD-10-CM | POA: Diagnosis not present

## 2015-03-16 DIAGNOSIS — O99333 Smoking (tobacco) complicating pregnancy, third trimester: Secondary | ICD-10-CM | POA: Diagnosis not present

## 2015-03-16 DIAGNOSIS — G43909 Migraine, unspecified, not intractable, without status migrainosus: Secondary | ICD-10-CM | POA: Diagnosis not present

## 2015-03-16 DIAGNOSIS — O99891 Other specified diseases and conditions complicating pregnancy: Secondary | ICD-10-CM

## 2015-03-16 LAB — POCT URINALYSIS DIP (DEVICE)
Bilirubin Urine: NEGATIVE
Glucose, UA: 100 mg/dL — AB
Hgb urine dipstick: NEGATIVE
Ketones, ur: NEGATIVE mg/dL
Leukocytes, UA: NEGATIVE
Nitrite: NEGATIVE
Protein, ur: NEGATIVE mg/dL
Specific Gravity, Urine: 1.025 (ref 1.005–1.030)
Urobilinogen, UA: 0.2 mg/dL (ref 0.0–1.0)
pH: 6 (ref 5.0–8.0)

## 2015-03-16 MED ORDER — BUTALBITAL-APAP-CAFFEINE 50-325-40 MG PO TABS: 1.0000 | ORAL_TABLET | Freq: Four times a day (QID) | ORAL | Status: DC | PRN

## 2015-03-16 MED ORDER — CYCLOBENZAPRINE HCL 5 MG PO TABS: 5.0000 mg | ORAL_TABLET | Freq: Three times a day (TID) | ORAL | Status: DC | PRN

## 2015-03-16 MED ORDER — BUTALBITAL-APAP-CAFFEINE 50-325-40 MG PO TABS: 1.0000 | ORAL_TABLET | Freq: Four times a day (QID) | ORAL | Status: AC | PRN

## 2015-03-16 NOTE — Progress Notes (Signed)
Pt requests refill of flexeril and Fioricet. Breastfeeding tip of the week reviewed.

## 2015-03-16 NOTE — Addendum Note (Signed)
Addended by: Jill SideAY, Dionne Knoop L on: 03/16/2015 12:16 PM   Modules accepted: Orders

## 2015-03-16 NOTE — Progress Notes (Signed)
Subjective:  Julia Hopkins is a 24 y.o. G2P0010 at 6654w4d being seen today for ongoing prenatal care.  She is currently monitored for the following issues for this high-risk pregnancy and has Supervision of high risk pregnancy, antepartum; Hypertension; Hypertension affecting pregnancy, antepartum; Substance abuse affecting pregnancy, antepartum; Tobacco smoking affecting pregnancy, antepartum; Obesity (BMI 30-39.9); Obesity affecting pregnancy in third trimester; and Chlamydia infection affecting pregnancy, antepartum on her problem list.  Patient reports continued daily central migraines and body aches. Desires refill of Fioricet and Flexeril. Also wants to know what her EDD is; had different ones with different ultrasounds. Contractions: Irregular. Vag. Bleeding: None.  Movement: Present. Denies leaking of fluid.   The following portions of the patient's history were reviewed and updated as appropriate: allergies, current medications, past family history, past medical history, past social history, past surgical history and problem list. Problem list updated.  Objective:   Filed Vitals:   03/16/15 0848  BP: 129/76  Pulse: 94  Weight: 255 lb 8 oz (115.894 kg)    Fetal Status: Fetal Heart Rate (bpm): NST Fundal Height: 34 cm Movement: Present     General:  Alert, oriented and cooperative. Patient is in no acute distress.  Skin: Skin is warm and dry. No rash noted.   Cardiovascular: Normal heart rate noted  Respiratory: Normal respiratory effort, no problems with respiration noted  Abdomen: Soft, gravid, appropriate for gestational age. Pain/Pressure: Present     Pelvic: Vag. Bleeding: None    Cervical exam deferred        Extremities: Normal range of motion.  Edema: Mild pitting, slight indentation  Mental Status: Normal mood and affect. Normal behavior. Normal judgment and thought content.   Urinalysis: Urine Protein: Negative Urine Glucose: 1+ NST performed today was reviewed and was found  to be reactive.  AFI normal at 10.3 cm.    Assessment and Plan:  Pregnancy: G2P0010 at 1054w4d  1. Hypertension affecting pregnancy, antepartum, third trimester Stable BP, no medications. Continue ASA.  12/1 EFW 28%, normal fluid. Continue recommended antenatal testing and prenatal care. Preeclampsia precautions reviewed.  2. Tobacco smoking affecting pregnancy, antepartum, third trimester 7754w4d: 2 cigs/day. Encouraged cessation.  3. Migraine without status migrainosus, not intractable, unspecified migraine type Will continue to monitor - butalbital-acetaminophen-caffeine (FIORICET) 50-325-40 MG tablet; Take 1-2 tablets by mouth every 6 (six) hours as needed for migraine.  Dispense: 20 tablet; Refill: 5  4. Back pain complicating pregnancy in third trimester - cyclobenzaprine (FLEXERIL) 5 MG tablet; Take 1 tablet (5 mg total) by mouth 3 (three) times daily as needed for muscle spasms.  Dispense: 30 tablet; Refill: 5  Reviewed all ultrasounds.  BEDC is based on definite CRL done at 3848w5d -> EDD 04/30/15. Patient informed. Added to sticky note for MFM/ultrasound providers.  Preterm labor symptoms and general obstetric precautions including but not limited to vaginal bleeding, contractions, leaking of fluid and fetal movement were reviewed in detail with the patient. Please refer to After Visit Summary for other counseling recommendations.  Return in about 4 days (around 03/20/2015) for 2x/wk as scheduled.   Tereso NewcomerUgonna A Alvera Tourigny, MD

## 2015-03-16 NOTE — Patient Instructions (Signed)
Return to clinic for any obstetric concerns or go to MAU for evaluation  

## 2015-03-20 ENCOUNTER — Ambulatory Visit (INDEPENDENT_AMBULATORY_CARE_PROVIDER_SITE_OTHER): Payer: Medicaid Other | Admitting: *Deleted

## 2015-03-20 VITALS — BP 141/81 | HR 97

## 2015-03-20 DIAGNOSIS — O163 Unspecified maternal hypertension, third trimester: Secondary | ICD-10-CM

## 2015-03-20 MED ORDER — FAMOTIDINE 20 MG PO TABS: 20.0000 mg | ORAL_TABLET | Freq: Two times a day (BID) | ORAL | Status: DC

## 2015-03-20 NOTE — Progress Notes (Signed)
Pt denies H/A or visual disturbances.  

## 2015-03-20 NOTE — Progress Notes (Signed)
NST reactive.

## 2015-03-22 ENCOUNTER — Telehealth: Payer: Self-pay | Admitting: *Deleted

## 2015-03-22 MED ORDER — TARON-C DHA 53.5-38-1 MG PO CAPS: 1.0000 | ORAL_CAPSULE | Freq: Every day | ORAL | Status: DC

## 2015-03-22 NOTE — Telephone Encounter (Signed)
Received call from pharmacist @ [facility] requesting Rx order for specific PNV - Taron C DHA.  Rx e-prescribed as requested.

## 2015-03-23 ENCOUNTER — Other Ambulatory Visit: Payer: Medicaid Other

## 2015-03-23 ENCOUNTER — Ambulatory Visit (INDEPENDENT_AMBULATORY_CARE_PROVIDER_SITE_OTHER): Payer: Medicaid Other | Admitting: Certified Nurse Midwife

## 2015-03-23 VITALS — BP 129/81 | HR 94 | Wt 260.1 lb

## 2015-03-23 DIAGNOSIS — O0993 Supervision of high risk pregnancy, unspecified, third trimester: Secondary | ICD-10-CM

## 2015-03-23 DIAGNOSIS — O163 Unspecified maternal hypertension, third trimester: Secondary | ICD-10-CM | POA: Diagnosis not present

## 2015-03-23 DIAGNOSIS — Z36 Encounter for antenatal screening of mother: Secondary | ICD-10-CM | POA: Diagnosis not present

## 2015-03-23 DIAGNOSIS — O99213 Obesity complicating pregnancy, third trimester: Secondary | ICD-10-CM

## 2015-03-23 DIAGNOSIS — O99320 Drug use complicating pregnancy, unspecified trimester: Secondary | ICD-10-CM

## 2015-03-23 DIAGNOSIS — O99323 Drug use complicating pregnancy, third trimester: Secondary | ICD-10-CM

## 2015-03-23 DIAGNOSIS — O99333 Smoking (tobacco) complicating pregnancy, third trimester: Secondary | ICD-10-CM

## 2015-03-23 DIAGNOSIS — E669 Obesity, unspecified: Secondary | ICD-10-CM

## 2015-03-23 DIAGNOSIS — F1910 Other psychoactive substance abuse, uncomplicated: Secondary | ICD-10-CM

## 2015-03-23 LAB — POCT URINALYSIS DIP (DEVICE)
Bilirubin Urine: NEGATIVE
Glucose, UA: 250 mg/dL — AB
Hgb urine dipstick: NEGATIVE
Ketones, ur: NEGATIVE mg/dL
Leukocytes, UA: NEGATIVE
Nitrite: NEGATIVE
Protein, ur: NEGATIVE mg/dL
Specific Gravity, Urine: >=1.03 (ref 1.005–1.030)
Urobilinogen, UA: 0.2 mg/dL (ref 0.0–1.0)
pH: 6 (ref 5.0–8.0)

## 2015-03-23 NOTE — Progress Notes (Signed)
US for growth and BPP on 12/22.  Pt reports developing a hemorrhoid.

## 2015-03-23 NOTE — Progress Notes (Signed)
Subjective:  Julia Hopkins is a 24 y.o. G2P0010 at 7479w4d being seen today for ongoing prenatal care.  She is currently monitored for the following issues for this high-risk pregnancy and has Supervision of high risk pregnancy, antepartum; Hypertension; Hypertension affecting pregnancy, antepartum; Substance abuse affecting pregnancy, antepartum; Tobacco smoking affecting pregnancy, antepartum; Obesity (BMI 30-39.9); Obesity affecting pregnancy in third trimester; and Chlamydia infection affecting pregnancy, antepartum on her problem list.  Patient reports hemorrhoid.  Contractions: Irregular. Vag. Bleeding: None.  Movement: Present. Denies leaking of fluid.   The following portions of the patient's history were reviewed and updated as appropriate: allergies, current medications, past family history, past medical history, past social history, past surgical history and problem list. Problem list updated.  Objective:   Filed Vitals:   03/23/15 0900  BP: 129/81  Pulse: 94  Weight: 260 lb 1.6 oz (117.981 kg)    Fetal Status: Fetal Heart Rate (bpm): NST   Movement: Present  Presentation: Vertex  General:  Alert, oriented and cooperative. Patient is in no acute distress.  Skin: Skin is warm and dry. No rash noted.   Cardiovascular: Normal heart rate noted  Respiratory: Normal respiratory effort, no problems with respiration noted  Abdomen: Soft, gravid, appropriate for gestational age. Pain/Pressure: Present     Pelvic: Vag. Bleeding: None     Cervical exam deferred        Extremities: Normal range of motion.  Edema: Mild pitting, slight indentation  Mental Status: Normal mood and affect. Normal behavior. Normal judgment and thought content.   Urinalysis:      Assessment and Plan:  Pregnancy: G2P0010 at 6079w4d  1. Hypertension affecting pregnancy, antepartum, third trimester Nst- Reactive AFI 10.6  2. Obesity (BMI 30-39.9)   3. Substance abuse affecting pregnancy, antepartum   4.  Supervision of high risk pregnancy, antepartum, third trimester   5. Tobacco smoking affecting pregnancy, antepartum, third trimester  6. Hemorrhoid Advised anusol Tylenol for discomfort  Preterm labor symptoms and general obstetric precautions including but not limited to vaginal bleeding, contractions, leaking of fluid and fetal movement were reviewed in detail with the patient. Please refer to After Visit Summary for other counseling recommendations.  Return in about 4 days (around 03/27/2015) for as scheduled.   Rhea PinkLori A Zaraya Delauder, CNM

## 2015-03-23 NOTE — Patient Instructions (Signed)
Smoking Cessation, Tips for Success If you are ready to quit smoking, congratulations! You have chosen to help yourself be healthier. Cigarettes bring nicotine, tar, carbon monoxide, and other irritants into your body. Your lungs, heart, and blood vessels will be able to work better without these poisons. There are many different ways to quit smoking. Nicotine gum, nicotine patches, a nicotine inhaler, or nicotine nasal spray can help with physical craving. Hypnosis, support groups, and medicines help break the habit of smoking. WHAT THINGS CAN I DO TO MAKE QUITTING EASIER?  Here are some tips to help you quit for good:  Pick a date when you will quit smoking completely. Tell all of your friends and family about your plan to quit on that date.  Do not try to slowly cut down on the number of cigarettes you are smoking. Pick a quit date and quit smoking completely starting on that day.  Throw away all cigarettes.   Clean and remove all ashtrays from your home, work, and car.  On a card, write down your reasons for quitting. Carry the card with you and read it when you get the urge to smoke.  Cleanse your body of nicotine. Drink enough water and fluids to keep your urine clear or pale yellow. Do this after quitting to flush the nicotine from your body.  Learn to predict your moods. Do not let a bad situation be your excuse to have a cigarette. Some situations in your life might tempt you into wanting a cigarette.  Never have "just one" cigarette. It leads to wanting another and another. Remind yourself of your decision to quit.  Change habits associated with smoking. If you smoked while driving or when feeling stressed, try other activities to replace smoking. Stand up when drinking your coffee. Brush your teeth after eating. Sit in a different chair when you read the paper. Avoid alcohol while trying to quit, and try to drink fewer caffeinated beverages. Alcohol and caffeine may urge you to  smoke.  Avoid foods and drinks that can trigger a desire to smoke, such as sugary or spicy foods and alcohol.  Ask people who smoke not to smoke around you.  Have something planned to do right after eating or having a cup of coffee. For example, plan to take a walk or exercise.  Try a relaxation exercise to calm you down and decrease your stress. Remember, you may be tense and nervous for the first 2 weeks after you quit, but this will pass.  Find new activities to keep your hands busy. Play with a pen, coin, or rubber band. Doodle or draw things on paper.  Brush your teeth right after eating. This will help cut down on the craving for the taste of tobacco after meals. You can also try mouthwash.   Use oral substitutes in place of cigarettes. Try using lemon drops, carrots, cinnamon sticks, or chewing gum. Keep them handy so they are available when you have the urge to smoke.  When you have the urge to smoke, try deep breathing.  Designate your home as a nonsmoking area.  If you are a heavy smoker, ask your health care provider about a prescription for nicotine chewing gum. It can ease your withdrawal from nicotine.  Reward yourself. Set aside the cigarette money you save and buy yourself something nice.  Look for support from others. Join a support group or smoking cessation program. Ask someone at home or at work to help you with your plan   to quit smoking.  Always ask yourself, "Do I need this cigarette or is this just a reflex?" Tell yourself, "Today, I choose not to smoke," or "I do not want to smoke." You are reminding yourself of your decision to quit.  Do not replace cigarette smoking with electronic cigarettes (commonly called e-cigarettes). The safety of e-cigarettes is unknown, and some may contain harmful chemicals.  If you relapse, do not give up! Plan ahead and think about what you will do the next time you get the urge to smoke. HOW WILL I FEEL WHEN I QUIT SMOKING? You  may have symptoms of withdrawal because your body is used to nicotine (the addictive substance in cigarettes). You may crave cigarettes, be irritable, feel very hungry, cough often, get headaches, or have difficulty concentrating. The withdrawal symptoms are only temporary. They are strongest when you first quit but will go away within 10-14 days. When withdrawal symptoms occur, stay in control. Think about your reasons for quitting. Remind yourself that these are signs that your body is healing and getting used to being without cigarettes. Remember that withdrawal symptoms are easier to treat than the major diseases that smoking can cause.  Even after the withdrawal is over, expect periodic urges to smoke. However, these cravings are generally short lived and will go away whether you smoke or not. Do not smoke! WHAT RESOURCES ARE AVAILABLE TO HELP ME QUIT SMOKING? Your health care provider can direct you to community resources or hospitals for support, which may include:  Group support.  Education.  Hypnosis.  Therapy.   This information is not intended to replace advice given to you by your health care provider. Make sure you discuss any questions you have with your health care provider.   Document Released: 12/22/2003 Document Revised: 04/15/2014 Document Reviewed: 09/10/2012 Elsevier Interactive Patient Education 2016 [employer]  

## 2015-03-24 NOTE — Addendum Note (Signed)
Addended by: Jill SideAY, Odetta Forness L on: 03/24/2015 09:19 AM   Modules accepted: Orders

## 2015-03-27 ENCOUNTER — Ambulatory Visit (INDEPENDENT_AMBULATORY_CARE_PROVIDER_SITE_OTHER): Payer: Medicaid Other | Admitting: *Deleted

## 2015-03-27 VITALS — BP 127/73 | HR 97

## 2015-03-27 DIAGNOSIS — O163 Unspecified maternal hypertension, third trimester: Secondary | ICD-10-CM

## 2015-03-27 NOTE — Progress Notes (Signed)
12/19 NST reviewed and reactive 

## 2015-03-28 ENCOUNTER — Encounter: Payer: Self-pay | Admitting: *Deleted

## 2015-03-30 ENCOUNTER — Ambulatory Visit (INDEPENDENT_AMBULATORY_CARE_PROVIDER_SITE_OTHER): Payer: Medicaid Other | Admitting: Obstetrics & Gynecology

## 2015-03-30 ENCOUNTER — Encounter (HOSPITAL_COMMUNITY): Payer: Self-pay

## 2015-03-30 ENCOUNTER — Other Ambulatory Visit (HOSPITAL_COMMUNITY): Payer: Self-pay | Admitting: Maternal and Fetal Medicine

## 2015-03-30 ENCOUNTER — Ambulatory Visit (HOSPITAL_COMMUNITY)
Admission: RE | Admit: 2015-03-30 | Discharge: 2015-03-30 | Disposition: A | Discharge status: Home / Self Care | Payer: Medicaid Other | Source: Ambulatory Visit | Attending: Certified Nurse Midwife | Admitting: Certified Nurse Midwife

## 2015-03-30 ENCOUNTER — Telehealth (HOSPITAL_COMMUNITY): Payer: Self-pay | Admitting: *Deleted

## 2015-03-30 VITALS — BP 136/88 | HR 92 | Temp 98.3°F | Wt 258.0 lb

## 2015-03-30 DIAGNOSIS — Z3A35 35 weeks gestation of pregnancy: Secondary | ICD-10-CM

## 2015-03-30 DIAGNOSIS — O169 Unspecified maternal hypertension, unspecified trimester: Secondary | ICD-10-CM

## 2015-03-30 DIAGNOSIS — O163 Unspecified maternal hypertension, third trimester: Secondary | ICD-10-CM

## 2015-03-30 DIAGNOSIS — O99333 Smoking (tobacco) complicating pregnancy, third trimester: Secondary | ICD-10-CM

## 2015-03-30 DIAGNOSIS — O10013 Pre-existing essential hypertension complicating pregnancy, third trimester: Secondary | ICD-10-CM | POA: Insufficient documentation

## 2015-03-30 DIAGNOSIS — O10913 Unspecified pre-existing hypertension complicating pregnancy, third trimester: Secondary | ICD-10-CM | POA: Diagnosis present

## 2015-03-30 DIAGNOSIS — O99213 Obesity complicating pregnancy, third trimester: Secondary | ICD-10-CM | POA: Diagnosis not present

## 2015-03-30 DIAGNOSIS — O0993 Supervision of high risk pregnancy, unspecified, third trimester: Secondary | ICD-10-CM

## 2015-03-30 LAB — POCT URINALYSIS DIP (DEVICE)
Bilirubin Urine: NEGATIVE
Glucose, UA: NEGATIVE mg/dL
Hgb urine dipstick: NEGATIVE
Ketones, ur: NEGATIVE mg/dL
Nitrite: NEGATIVE
Protein, ur: NEGATIVE mg/dL
Specific Gravity, Urine: 1.025 (ref 1.005–1.030)
Urobilinogen, UA: 0.2 mg/dL (ref 0.0–1.0)
pH: 6 (ref 5.0–8.0)

## 2015-03-30 MED ORDER — ONDANSETRON 4 MG PO TBDP: 4.0000 mg | ORAL_TABLET | Freq: Four times a day (QID) | ORAL | Status: DC | PRN

## 2015-03-30 NOTE — Telephone Encounter (Signed)
TC to notify pt that her NST's and AFI's will be scheduled in the clinic and she will not need to come to MFM for AFI's. Name and DOB verified.  Pt voices understanding.

## 2015-03-30 NOTE — Progress Notes (Signed)
Patient reports severe lower back pain this morning & it feels like her left foot is on fire  Reviewed tip of week with patient

## 2015-03-30 NOTE — Patient Instructions (Signed)

## 2015-03-30 NOTE — Progress Notes (Signed)
Subjective: US appt today. Diarrhea for 2 days, lots of heartburn  Julia Hopkins is a 24 y.o. G2P0010 at 6369w4d being seen today for ongoing prenatal care.  She is currently monitored for the following issues for this high-risk pregnancy and has Supervision of high risk pregnancy, antepartum; Hypertension; Hypertension affecting pregnancy, antepartum; Substance abuse affecting pregnancy, antepartum; Tobacco smoking affecting pregnancy, antepartum; Obesity (BMI 30-39.9); Obesity affecting pregnancy in third trimester; and Chlamydia infection affecting pregnancy, antepartum on her problem list.  Patient reports no complaints.  Contractions: Not present. Vag. Bleeding: None.  Movement: Present. Denies leaking of fluid.   The following portions of the patient's history were reviewed and updated as appropriate: allergies, current medications, past family history, past medical history, past social history, past surgical history and problem list. Problem list updated.  Objective:   Filed Vitals:   03/30/15 0825  BP: 136/88  Pulse: 92  Temp: 98.3 F (36.8 C)  Weight: 258 lb (117.028 kg)    Fetal Status: Fetal Heart Rate (bpm): 155   Movement: Present     General:  Alert, oriented and cooperative. Patient is in no acute distress.  Skin: Skin is warm and dry. No rash noted.   Cardiovascular: Normal heart rate noted  Respiratory: Normal respiratory effort, no problems with respiration noted  Abdomen: Soft, gravid, appropriate for gestational age. Pain/Pressure: Present     Pelvic: Vag. Bleeding: None     Cervical exam deferred        Extremities: Normal range of motion.  Edema: Mild pitting, slight indentation  Mental Status: Normal mood and affect. Normal behavior. Normal judgment and thought content.   Urinalysis: Urine Protein: Negative Urine Glucose: Negative  Assessment and Plan:  Pregnancy: G2P0010 at 3969w4d  1. Supervision of high risk pregnancy, antepartum, third trimester BP acceptable  range today  Preterm labor symptoms and general obstetric precautions including but not limited to vaginal bleeding, contractions, leaking of fluid and fetal movement were reviewed in detail with the patient. Please refer to After Visit Summary for other counseling recommendations.  1 week f/u, NST 2/week  Adam PhenixJames G Arnold, MD

## 2015-04-06 ENCOUNTER — Ambulatory Visit (HOSPITAL_COMMUNITY): Payer: Medicaid Other

## 2015-04-06 ENCOUNTER — Ambulatory Visit (INDEPENDENT_AMBULATORY_CARE_PROVIDER_SITE_OTHER): Payer: Medicaid Other | Admitting: Family Medicine

## 2015-04-06 VITALS — BP 135/77 | HR 105 | Wt 260.1 lb

## 2015-04-06 DIAGNOSIS — O0993 Supervision of high risk pregnancy, unspecified, third trimester: Secondary | ICD-10-CM

## 2015-04-06 DIAGNOSIS — E669 Obesity, unspecified: Secondary | ICD-10-CM | POA: Diagnosis not present

## 2015-04-06 DIAGNOSIS — O10913 Unspecified pre-existing hypertension complicating pregnancy, third trimester: Secondary | ICD-10-CM

## 2015-04-06 DIAGNOSIS — Z36 Encounter for antenatal screening of mother: Secondary | ICD-10-CM

## 2015-04-06 DIAGNOSIS — O99213 Obesity complicating pregnancy, third trimester: Secondary | ICD-10-CM | POA: Diagnosis not present

## 2015-04-06 DIAGNOSIS — O99333 Smoking (tobacco) complicating pregnancy, third trimester: Secondary | ICD-10-CM | POA: Diagnosis not present

## 2015-04-06 DIAGNOSIS — O163 Unspecified maternal hypertension, third trimester: Secondary | ICD-10-CM

## 2015-04-06 LAB — POCT URINALYSIS DIP (DEVICE)
Bilirubin Urine: NEGATIVE
Glucose, UA: NEGATIVE mg/dL
Hgb urine dipstick: NEGATIVE
Ketones, ur: NEGATIVE mg/dL
Leukocytes, UA: NEGATIVE
Nitrite: NEGATIVE
Protein, ur: NEGATIVE mg/dL
Specific Gravity, Urine: 1.02 (ref 1.005–1.030)
Urobilinogen, UA: 0.2 mg/dL (ref 0.0–1.0)
pH: 6 (ref 5.0–8.0)

## 2015-04-06 LAB — OB RESULTS CONSOLE GBS: GBS: NEGATIVE

## 2015-04-06 NOTE — Patient Instructions (Signed)

## 2015-04-06 NOTE — Progress Notes (Signed)
Subjective:  Julia PrudeKeiya Hopkins is a 24 y.o. G2P0010 at 5427w4d being seen today for ongoing prenatal care.  She is currently monitored for the following issues for this high-risk pregnancy and has Supervision of high risk pregnancy, antepartum; Hypertension; Hypertension affecting pregnancy, antepartum; Substance abuse affecting pregnancy, antepartum; Tobacco smoking affecting pregnancy, antepartum; Obesity (BMI 30-39.9); Obesity affecting pregnancy in third trimester; and Chlamydia infection affecting pregnancy, antepartum on her problem list.  Patient reports no complaints.  Contractions: Not present. Vag. Bleeding: None.  Movement: Present. Denies leaking of fluid.   The following portions of the patient's history were reviewed and updated as appropriate: allergies, current medications, past family history, past medical history, past social history, past surgical history and problem list. Problem list updated.  Objective:   Filed Vitals:   04/06/15 0853  BP: 135/77  Pulse: 105  Weight: 260 lb 1.6 oz (117.981 kg)    Fetal Status: Fetal Heart Rate (bpm): NST   Movement: Present  Presentation: Vertex  General:  Alert, oriented and cooperative. Patient is in no acute distress.  Skin: Skin is warm and dry. No rash noted.   Cardiovascular: Normal heart rate noted  Respiratory: Normal respiratory effort, no problems with respiration noted  Abdomen: Soft, gravid, appropriate for gestational age. Pain/Pressure: Present     Pelvic: Vag. Bleeding: None     Cervical exam deferred        Extremities: Normal range of motion.     Mental Status: Normal mood and affect. Normal behavior. Normal judgment and thought content.   Urinalysis: Urine Protein: Negative Urine Glucose: Negative  Assessment and Plan:  Pregnancy: G2P0010 at 6927w4d  1. Supervision of high risk pregnancy, antepartum, third trimester FH, FHT normal  2. Hypertension affecting pregnancy, antepartum, third trimester Normotensive.   NST  reactive Continue twice weekly NST  Preterm labor symptoms and general obstetric precautions including but not limited to vaginal bleeding, contractions, leaking of fluid and fetal movement were reviewed in detail with the patient. Please refer to After Visit Summary for other counseling recommendations.  Return in about 4 days (around 04/10/2015) for 2x/wk as scheduled.   Levie HeritageJacob J Stinson, DO

## 2015-04-08 LAB — CULTURE, BETA STREP (GROUP B ONLY)

## 2015-04-09 NOTE — L&D Delivery Note (Signed)
Delivery Note Pt pushed well and at 10:44 PM a viable female was delivered via  (Presentation: LOA).  APGAR: 7,9 ; weight: pending  .  Body cord around shoulders reduced after delivery. Cord clamped and cut by FOB. Hospital cord blood sample collected.  Placenta status: spont , intact .  Cord: 3 vessel     Anesthesia:  epidural Episiotomy:  none Lacerations:  none Est. Blood Loss (mL):  275  Mom to postpartum/antenatal for recovery w/ mag sulfate x 24hr.  Baby to Couplet care / Skin to Skin.  Cam HaiSHAW, Annaleise Burger CNM 04/19/2015, 11:12 PM

## 2015-04-10 ENCOUNTER — Ambulatory Visit (INDEPENDENT_AMBULATORY_CARE_PROVIDER_SITE_OTHER): Payer: Medicaid Other | Admitting: *Deleted

## 2015-04-10 VITALS — BP 127/79 | HR 94

## 2015-04-10 DIAGNOSIS — O163 Unspecified maternal hypertension, third trimester: Secondary | ICD-10-CM

## 2015-04-10 NOTE — Progress Notes (Signed)
nst reactive

## 2015-04-13 ENCOUNTER — Ambulatory Visit (HOSPITAL_COMMUNITY): Payer: Medicaid Other

## 2015-04-13 ENCOUNTER — Ambulatory Visit (INDEPENDENT_AMBULATORY_CARE_PROVIDER_SITE_OTHER): Payer: Medicaid Other | Admitting: Advanced Practice Midwife

## 2015-04-13 ENCOUNTER — Encounter: Payer: Self-pay | Admitting: Advanced Practice Midwife

## 2015-04-13 VITALS — BP 136/92 | HR 89 | Wt 262.8 lb

## 2015-04-13 DIAGNOSIS — O10919 Unspecified pre-existing hypertension complicating pregnancy, unspecified trimester: Secondary | ICD-10-CM

## 2015-04-13 DIAGNOSIS — IMO0002 Reserved for concepts with insufficient information to code with codable children: Secondary | ICD-10-CM | POA: Insufficient documentation

## 2015-04-13 DIAGNOSIS — O163 Unspecified maternal hypertension, third trimester: Secondary | ICD-10-CM | POA: Diagnosis not present

## 2015-04-13 DIAGNOSIS — Z36 Encounter for antenatal screening of mother: Secondary | ICD-10-CM | POA: Diagnosis not present

## 2015-04-13 HISTORY — DX: Reserved for concepts with insufficient information to code with codable children: IMO0002

## 2015-04-13 LAB — POCT URINALYSIS DIP (DEVICE)
Bilirubin Urine: NEGATIVE
Glucose, UA: NEGATIVE mg/dL
Hgb urine dipstick: NEGATIVE
Ketones, ur: NEGATIVE mg/dL
Leukocytes, UA: NEGATIVE
Nitrite: NEGATIVE
Protein, ur: NEGATIVE mg/dL
Specific Gravity, Urine: >=1.03 (ref 1.005–1.030)
Urobilinogen, UA: 0.2 mg/dL (ref 0.0–1.0)
pH: 6 (ref 5.0–8.0)

## 2015-04-13 NOTE — Progress Notes (Signed)
Left w/out being seen by provider after reactive NST. BP stable. Has Appt scheduled 04/17/15.

## 2015-04-13 NOTE — Progress Notes (Signed)
Pt continues to have headaches - relieved by Fioricet. She denies visual disturbances.

## 2015-04-17 ENCOUNTER — Ambulatory Visit (HOSPITAL_COMMUNITY): Payer: Medicaid Other

## 2015-04-17 ENCOUNTER — Ambulatory Visit (INDEPENDENT_AMBULATORY_CARE_PROVIDER_SITE_OTHER): Payer: Medicaid Other | Admitting: Obstetrics and Gynecology

## 2015-04-17 VITALS — BP 155/86 | HR 85 | Wt 266.3 lb

## 2015-04-17 DIAGNOSIS — O10913 Unspecified pre-existing hypertension complicating pregnancy, third trimester: Secondary | ICD-10-CM

## 2015-04-17 LAB — CBC
HCT: 35.3 % — ABNORMAL LOW (ref 36.0–46.0)
Hemoglobin: 12 g/dL (ref 12.0–15.0)
MCH: 28.8 pg (ref 26.0–34.0)
MCHC: 34 g/dL (ref 30.0–36.0)
MCV: 84.9 fL (ref 78.0–100.0)
MPV: 9.9 fL (ref 8.6–12.4)
Platelets: 326 10*3/uL (ref 150–400)
RBC: 4.16 MIL/uL (ref 3.87–5.11)
RDW: 14 % (ref 11.5–15.5)
WBC: 11.2 10*3/uL — ABNORMAL HIGH (ref 4.0–10.5)

## 2015-04-17 LAB — COMPREHENSIVE METABOLIC PANEL
ALT: 14 U/L (ref 6–29)
AST: 11 U/L (ref 10–30)
Albumin: 3.1 g/dL — ABNORMAL LOW (ref 3.6–5.1)
Alkaline Phosphatase: 134 U/L — ABNORMAL HIGH (ref 33–115)
BUN: 9 mg/dL (ref 7–25)
CO2: 22 mmol/L (ref 20–31)
Calcium: 8.8 mg/dL (ref 8.6–10.2)
Chloride: 107 mmol/L (ref 98–110)
Creat: 0.58 mg/dL (ref 0.50–1.10)
Glucose, Bld: 82 mg/dL (ref 65–99)
Potassium: 4 mmol/L (ref 3.5–5.3)
Sodium: 137 mmol/L (ref 135–146)
Total Bilirubin: 0.1 mg/dL — ABNORMAL LOW (ref 0.2–1.2)
Total Protein: 6 g/dL — ABNORMAL LOW (ref 6.1–8.1)

## 2015-04-17 LAB — POCT URINALYSIS DIP (DEVICE)
Bilirubin Urine: NEGATIVE
Glucose, UA: NEGATIVE mg/dL
Hgb urine dipstick: NEGATIVE
Ketones, ur: NEGATIVE mg/dL
Leukocytes, UA: NEGATIVE
Nitrite: NEGATIVE
Protein, ur: NEGATIVE mg/dL
Specific Gravity, Urine: 1.025 (ref 1.005–1.030)
Urobilinogen, UA: 0.2 mg/dL (ref 0.0–1.0)
pH: 6 (ref 5.0–8.0)
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
## 2015-04-17 NOTE — Progress Notes (Signed)
Pt reports UC's since last night which have been 5-9 minutes apart. Pt was unable to complete prenatal visit on 1/5 due to transportation issues. She requests to complete visit today and to have Cx exam as she believes she is in labor.  IOL scheduled 1/22 @ midnight.  US for growth scheduled 1/16.

## 2015-04-18 ENCOUNTER — Inpatient Hospital Stay (HOSPITAL_COMMUNITY)
Admission: AD | Admit: 2015-04-18 | Discharge: 2015-04-18 | Disposition: A | Discharge status: Home / Self Care | Payer: Medicaid Other | Source: Ambulatory Visit | Attending: Obstetrics & Gynecology | Admitting: Obstetrics & Gynecology

## 2015-04-18 ENCOUNTER — Encounter (HOSPITAL_COMMUNITY): Payer: Self-pay | Admitting: *Deleted

## 2015-04-18 HISTORY — DX: Other psychoactive substance abuse, uncomplicated: F19.10

## 2015-04-18 LAB — PROTEIN / CREATININE RATIO, URINE
Creatinine, Urine: 179 mg/dL (ref 20–320)
Protein Creatinine Ratio: 106 mg/g creat (ref 21–161)
Total Protein, Urine: 19 mg/dL (ref 5–24)

## 2015-04-18 NOTE — MAU Note (Signed)
Arrived via EMS with C/O contractions. No leaking or bleeding.

## 2015-04-18 NOTE — Progress Notes (Signed)
Dr. Ashok PallWouk given report, reviewed tracing, BP's, reviewed medical record from clinic. Gave D/C order. Patient has clinic appt in 2 days.

## 2015-04-18 NOTE — Discharge Instructions (Signed)
Keep your appt on Thursday. Return to MAU as needed.

## 2015-04-18 NOTE — MAU Note (Signed)
Discussed D/C instrs at length. Patient voices concern of transportation issues. Raquel SarnaLorinda Shaw, RN,AC contacted. Bus pass given to patient. Patient trying to call a friend for a ride, but will take the bus if attempts to contact friend are unsuccessful. Patient will wait in lobby.

## 2015-04-19 ENCOUNTER — Encounter (HOSPITAL_COMMUNITY): Payer: Self-pay | Admitting: *Deleted

## 2015-04-19 ENCOUNTER — Inpatient Hospital Stay (HOSPITAL_COMMUNITY): Payer: Medicaid Other | Admitting: Anesthesiology

## 2015-04-19 ENCOUNTER — Inpatient Hospital Stay (HOSPITAL_COMMUNITY)
Admission: AD | Admit: 2015-04-19 | Discharge: 2015-04-21 | DRG: 774 | Disposition: A | Discharge status: Home / Self Care | Payer: Medicaid Other | Source: Ambulatory Visit | Attending: Obstetrics and Gynecology | Admitting: Obstetrics and Gynecology

## 2015-04-19 DIAGNOSIS — Z8673 Personal history of transient ischemic attack (TIA), and cerebral infarction without residual deficits: Secondary | ICD-10-CM

## 2015-04-19 DIAGNOSIS — O113 Pre-existing hypertension with pre-eclampsia, third trimester: Secondary | ICD-10-CM | POA: Diagnosis present

## 2015-04-19 DIAGNOSIS — O99334 Smoking (tobacco) complicating childbirth: Secondary | ICD-10-CM | POA: Diagnosis present

## 2015-04-19 DIAGNOSIS — Z3A38 38 weeks gestation of pregnancy: Secondary | ICD-10-CM

## 2015-04-19 DIAGNOSIS — O114 Pre-existing hypertension with pre-eclampsia, complicating childbirth: Secondary | ICD-10-CM | POA: Diagnosis not present

## 2015-04-19 DIAGNOSIS — O99320 Drug use complicating pregnancy, unspecified trimester: Secondary | ICD-10-CM

## 2015-04-19 DIAGNOSIS — F17210 Nicotine dependence, cigarettes, uncomplicated: Secondary | ICD-10-CM | POA: Diagnosis present

## 2015-04-19 DIAGNOSIS — O1092 Unspecified pre-existing hypertension complicating childbirth: Secondary | ICD-10-CM | POA: Diagnosis present

## 2015-04-19 DIAGNOSIS — Z6841 Body Mass Index (BMI) 40.0 and over, adult: Secondary | ICD-10-CM | POA: Diagnosis not present

## 2015-04-19 DIAGNOSIS — O99213 Obesity complicating pregnancy, third trimester: Secondary | ICD-10-CM

## 2015-04-19 DIAGNOSIS — O10919 Unspecified pre-existing hypertension complicating pregnancy, unspecified trimester: Secondary | ICD-10-CM | POA: Diagnosis present

## 2015-04-19 DIAGNOSIS — O1490 Unspecified pre-eclampsia, unspecified trimester: Secondary | ICD-10-CM | POA: Diagnosis present

## 2015-04-19 LAB — CBC
HCT: 33.3 % — ABNORMAL LOW (ref 36.0–46.0)
HCT: 35.6 % — ABNORMAL LOW (ref 36.0–46.0)
HCT: 36.2 % (ref 36.0–46.0)
Hemoglobin: 11.2 g/dL — ABNORMAL LOW (ref 12.0–15.0)
Hemoglobin: 12.2 g/dL (ref 12.0–15.0)
Hemoglobin: 12.2 g/dL (ref 12.0–15.0)
MCH: 28.9 pg (ref 26.0–34.0)
MCH: 29.2 pg (ref 26.0–34.0)
MCH: 29.5 pg (ref 26.0–34.0)
MCHC: 33.6 g/dL (ref 30.0–36.0)
MCHC: 33.7 g/dL (ref 30.0–36.0)
MCHC: 34.3 g/dL (ref 30.0–36.0)
MCV: 85.8 fL (ref 78.0–100.0)
MCV: 86 fL (ref 78.0–100.0)
MCV: 86.9 fL (ref 78.0–100.0)
Platelets: 286 10*3/uL (ref 150–400)
Platelets: 309 10*3/uL (ref 150–400)
Platelets: 312 10*3/uL (ref 150–400)
RBC: 3.83 MIL/uL — ABNORMAL LOW (ref 3.87–5.11)
RBC: 4.14 MIL/uL (ref 3.87–5.11)
RBC: 4.22 MIL/uL (ref 3.87–5.11)
RDW: 14.2 % (ref 11.5–15.5)
RDW: 14.2 % (ref 11.5–15.5)
RDW: 14.4 % (ref 11.5–15.5)
WBC: 14.2 10*3/uL — ABNORMAL HIGH (ref 4.0–10.5)
WBC: 15 10*3/uL — ABNORMAL HIGH (ref 4.0–10.5)
WBC: 15.1 10*3/uL — ABNORMAL HIGH (ref 4.0–10.5)

## 2015-04-19 LAB — COMPREHENSIVE METABOLIC PANEL
ALT: 19 U/L (ref 14–54)
AST: 16 U/L (ref 15–41)
Albumin: 3 g/dL — ABNORMAL LOW (ref 3.5–5.0)
Alkaline Phosphatase: 155 U/L — ABNORMAL HIGH (ref 38–126)
Anion gap: 13 (ref 5–15)
BUN: 10 mg/dL (ref 6–20)
CO2: 19 mmol/L — ABNORMAL LOW (ref 22–32)
Calcium: 9 mg/dL (ref 8.9–10.3)
Chloride: 104 mmol/L (ref 101–111)
Creatinine, Ser: 0.62 mg/dL (ref 0.44–1.00)
GFR calc Af Amer: >60 mL/min (ref >60)
GFR calc non Af Amer: >60 mL/min (ref >60)
Glucose, Bld: 110 mg/dL — ABNORMAL HIGH (ref 65–99)
Potassium: 3.6 mmol/L (ref 3.5–5.1)
Sodium: 136 mmol/L (ref 135–145)
Total Bilirubin: 0.5 mg/dL (ref 0.3–1.2)
Total Protein: 6.9 g/dL (ref 6.5–8.1)

## 2015-04-19 LAB — RAPID URINE DRUG SCREEN, HOSP PERFORMED
Amphetamines: NOT DETECTED
Barbiturates: POSITIVE — AB
Benzodiazepines: NOT DETECTED
Cocaine: NOT DETECTED
Opiates: NOT DETECTED
Tetrahydrocannabinol: NOT DETECTED

## 2015-04-19 LAB — URINALYSIS, ROUTINE W REFLEX MICROSCOPIC
Glucose, UA: NEGATIVE mg/dL
Ketones, ur: >80 mg/dL — AB
Nitrite: NEGATIVE
Protein, ur: 30 mg/dL — AB
Specific Gravity, Urine: 1.025 (ref 1.005–1.030)
pH: 6 (ref 5.0–8.0)

## 2015-04-19 LAB — URINE MICROSCOPIC-ADD ON: RBC / HPF: NONE SEEN RBC/hpf (ref 0–5)

## 2015-04-19 LAB — TYPE AND SCREEN
ABO/RH(D): A POS
Antibody Screen: NEGATIVE

## 2015-04-19 LAB — PROTEIN / CREATININE RATIO, URINE
Creatinine, Urine: 263 mg/dL
Protein Creatinine Ratio: 0.14 mg/mg{Cre} (ref 0.00–0.15)
Total Protein, Urine: 37 mg/dL

## 2015-04-19 LAB — ABO/RH: ABO/RH(D): A POS

## 2015-04-19 LAB — RPR: RPR Ser Ql: NONREACTIVE

## 2015-04-19 MED ORDER — TERBUTALINE SULFATE 1 MG/ML IJ SOLN: 0.2500 mg | Freq: Once | INTRAMUSCULAR | Status: DC | PRN

## 2015-04-19 MED ORDER — PHENYLEPHRINE 40 MCG/ML (10ML) SYRINGE FOR IV PUSH (FOR BLOOD PRESSURE SUPPORT)
80.0000 ug | PREFILLED_SYRINGE | INTRAVENOUS | Status: DC | PRN
  Filled 2015-04-19: qty 2

## 2015-04-19 MED ORDER — LABETALOL HCL 5 MG/ML IV SOLN
INTRAVENOUS | Status: AC
  Filled 2015-04-19: qty 4

## 2015-04-19 MED ORDER — HYDRALAZINE HCL 20 MG/ML IJ SOLN: 10.0000 mg | Freq: Once | INTRAMUSCULAR | Status: DC | PRN

## 2015-04-19 MED ORDER — ONDANSETRON HCL 4 MG/2ML IJ SOLN: 4.0000 mg | Freq: Four times a day (QID) | INTRAMUSCULAR | Status: DC | PRN

## 2015-04-19 MED ORDER — MAGNESIUM SULFATE BOLUS VIA INFUSION
6.0000 g | Freq: Once | INTRAVENOUS | Status: AC
  Administered 2015-04-19: 6 g via INTRAVENOUS
  Filled 2015-04-19: qty 500

## 2015-04-19 MED ORDER — FENTANYL 2.5 MCG/ML BUPIVACAINE 1/10 % EPIDURAL INFUSION (WH - ANES)
14.0000 mL/h | INTRAMUSCULAR | Status: DC | PRN
  Filled 2015-04-19: qty 125

## 2015-04-19 MED ORDER — FLEET ENEMA 7-19 GM/118ML RE ENEM: 1.0000 | ENEMA | RECTAL | Status: DC | PRN

## 2015-04-19 MED ORDER — OXYTOCIN 10 UNIT/ML IJ SOLN: 2.5000 [IU]/h | INTRAVENOUS | Status: DC

## 2015-04-19 MED ORDER — DIPHENHYDRAMINE HCL 50 MG/ML IJ SOLN: 12.5000 mg | INTRAMUSCULAR | Status: DC | PRN

## 2015-04-19 MED ORDER — LACTATED RINGERS IV SOLN
500.0000 mL | INTRAVENOUS | Status: DC | PRN
  Administered 2015-04-19: 500 mL via INTRAVENOUS

## 2015-04-19 MED ORDER — EPHEDRINE 5 MG/ML INJ
10.0000 mg | INTRAVENOUS | Status: DC | PRN
  Filled 2015-04-19: qty 2

## 2015-04-19 MED ORDER — MISOPROSTOL 25 MCG QUARTER TABLET
25.0000 ug | ORAL_TABLET | ORAL | Status: DC | PRN
  Filled 2015-04-19: qty 1

## 2015-04-19 MED ORDER — CITRIC ACID-SODIUM CITRATE 334-500 MG/5ML PO SOLN: 30.0000 mL | ORAL | Status: DC | PRN

## 2015-04-19 MED ORDER — LABETALOL HCL 5 MG/ML IV SOLN
40.0000 mg | Freq: Once | INTRAVENOUS | Status: AC
Start: 2015-04-19 — End: 2015-04-19
  Administered 2015-04-19: 80 mg via INTRAVENOUS

## 2015-04-19 MED ORDER — LABETALOL HCL 100 MG PO TABS
200.0000 mg | ORAL_TABLET | Freq: Three times a day (TID) | ORAL | Status: DC
  Administered 2015-04-19 – 2015-04-20 (×2): 200 mg via ORAL
  Filled 2015-04-19: qty 2
  Filled 2015-04-19 (×3): qty 1

## 2015-04-19 MED ORDER — PHENYLEPHRINE 40 MCG/ML (10ML) SYRINGE FOR IV PUSH (FOR BLOOD PRESSURE SUPPORT)
PREFILLED_SYRINGE | INTRAVENOUS | Status: AC
  Filled 2015-04-19: qty 10

## 2015-04-19 MED ORDER — FENTANYL 2.5 MCG/ML BUPIVACAINE 1/10 % EPIDURAL INFUSION (WH - ANES)
INTRAMUSCULAR | Status: DC | PRN
  Administered 2015-04-19: 14 mL/h via EPIDURAL

## 2015-04-19 MED ORDER — ACETAMINOPHEN 325 MG PO TABS: 650.0000 mg | ORAL_TABLET | ORAL | Status: DC | PRN

## 2015-04-19 MED ORDER — MAGNESIUM SULFATE 50 % IJ SOLN
2.0000 g/h | INTRAVENOUS | Status: DC
  Filled 2015-04-19: qty 80

## 2015-04-19 MED ORDER — LACTATED RINGERS IV SOLN
INTRAVENOUS | Status: DC
  Administered 2015-04-19 (×2): via INTRAVENOUS

## 2015-04-19 MED ORDER — LABETALOL HCL 5 MG/ML IV SOLN
20.0000 mg | INTRAVENOUS | Status: AC | PRN
  Administered 2015-04-19: 40 mg via INTRAVENOUS
  Administered 2015-04-19 (×2): 20 mg via INTRAVENOUS
  Filled 2015-04-19: qty 4
  Filled 2015-04-19: qty 8
  Filled 2015-04-19: qty 4

## 2015-04-19 MED ORDER — FENTANYL 2.5 MCG/ML BUPIVACAINE 1/10 % EPIDURAL INFUSION (WH - ANES): 14.0000 mL/h | INTRAMUSCULAR | Status: DC | PRN

## 2015-04-19 MED ORDER — LIDOCAINE HCL (PF) 1 % IJ SOLN
INTRAMUSCULAR | Status: DC | PRN
  Administered 2015-04-19 (×2): 5 mL

## 2015-04-19 MED ORDER — OXYTOCIN BOLUS FROM INFUSION
500.0000 mL | INTRAVENOUS | Status: DC
  Administered 2015-04-19: 500 mL via INTRAVENOUS

## 2015-04-19 MED ORDER — OXYCODONE-ACETAMINOPHEN 5-325 MG PO TABS: 2.0000 | ORAL_TABLET | ORAL | Status: DC | PRN

## 2015-04-19 MED ORDER — LABETALOL HCL 5 MG/ML IV SOLN: 10.0000 mg | INTRAVENOUS | Status: DC | PRN

## 2015-04-19 MED ORDER — OXYCODONE-ACETAMINOPHEN 5-325 MG PO TABS: 1.0000 | ORAL_TABLET | ORAL | Status: DC | PRN

## 2015-04-19 MED ORDER — LIDOCAINE HCL (PF) 1 % IJ SOLN
30.0000 mL | INTRAMUSCULAR | Status: DC | PRN
  Filled 2015-04-19: qty 30

## 2015-04-19 MED ORDER — LACTATED RINGERS IV SOLN
1.0000 m[IU]/min | INTRAVENOUS | Status: DC
  Administered 2015-04-19: 2 m[IU]/min via INTRAVENOUS
  Filled 2015-04-19: qty 4
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
## 2015-04-19 NOTE — MAU Note (Signed)
Contractions. No leaking No bleeding

## 2015-04-19 NOTE — Progress Notes (Signed)
Labor Progress Note  Julia PrudeKeiya Hopkins is a 25 y.o. G2P0010 at 2961w3d  admitted for augmentation of labor due to worsening Blood pressures in the setting of chronic HTN. Pre-e labs are within normal limits. Hx of cocaine and marijuana use.   S:  Patient doing well. Notes of HA with contractions; the HA eases off with resolution of contraction. No visual changes currently. No RUQ pain.    O:  BP 142/87 mmHg  Pulse 92  Temp(Src) 98.7 F (37.1 C) (Oral)  Resp 20  Ht 5\' 2"  (1.575 m)  Wt 120.657 kg (266 lb)  BMI 48.64 kg/m2  SpO2 99%  LMP 07/03/2014 (Exact Date)     FHT:  FHR: 140 bpm, variability: moderate,  accelerations:  Present,  decelerations:  Absent UC:  Every 2-7 mins SVE:   Dilation: 2 Effacement (%): 90 Station: -2 Exam by:: Weston,RN Intact Membranes   Labs: Lab Results  Component Value Date   WBC 14.2* 04/19/2015   HGB 12.2 04/19/2015   HCT 36.2 04/19/2015   MCV 85.8 04/19/2015   PLT 309 04/19/2015    Assessment / Plan: 25 y.o. G2P0010 4961w3d IOL for worsening blood pressures in the setting of cHTN.   Labor: Induction with FB Fetal Wellbeing:  Category I Pain Control:  analgesia upon request Anticipated MOD:  NSVD  Expectant management   Palma HolterKanishka G Ernst Cumpston, MD PGY1 Family Medicine

## 2015-04-19 NOTE — Progress Notes (Addendum)
Julia PrudeKeiya Hopkins is a [age] y.o. G2P0010 at 5937w3d  admitted for induction of labor due to Hypertension.  Subjective: Comfortable w/ epidural; has slight H/A but prefers not to take any meds yet; on sched Lab 200 TID and mag sulfate (was having severe range BPs)  Objective: BP 132/81 mmHg  Pulse 88  Temp(Src) 99 F (37.2 C) (Axillary)  Resp 18  Ht 5\' 2"  (1.575 m)  Wt 120.657 kg (266 lb)  BMI 48.64 kg/m2  SpO2 99%  LMP 07/03/2014 (Exact Date) I/O last 3 completed shifts: In: 2768.1 [P.O.:915; I.V.:1853.1] Out: 700 [Urine:700] Total I/O In: 340 [P.O.:340] Out: 150 [Urine:150]  FHT:  FHR: 130s bpm, variability: moderate,  accelerations:  Present,  decelerations:  Absent UC:   regular, every 3 minutes w/ Pit @ 314mu/min SVE:   Dilation: 8.5 Effacement (%): 100 Station: 0, +1 Exam by:: Clelia CroftShaw, CNM- AROM for mod MSF  Labs: Lab Results  Component Value Date   WBC 15.1* 04/19/2015   HGB 12.2 04/19/2015   HCT 35.6* 04/19/2015   MCV 86.0 04/19/2015   PLT 312 04/19/2015    Assessment / Plan: IUP@38 .3wks cHTN w/ prev severe range BPs- now stable Mild H/A  Plan to recheck cx in 2 hrs or sooner prn  Cam HaiSHAW, KIMBERLY CNM 04/19/2015, 9:08 PM

## 2015-04-19 NOTE — Anesthesia Preprocedure Evaluation (Addendum)
Anesthesia Evaluation  Patient identified by MRN, date of birth, ID band Patient awake    Reviewed: Allergy & Precautions, NPO status , Patient's Chart, lab work & pertinent test results  Airway Mallampati: II   Neck ROM: Full    Dental  (+) Teeth Intact   Pulmonary asthma , Current Smoker,    breath sounds clear to auscultation       Cardiovascular hypertension, Pt. on medications  Rhythm:Regular  On Labetol   Neuro/Psych Depression negative neurological ROS     GI/Hepatic negative GI ROS, Neg liver ROS,   Endo/Other  Morbid obesity  Renal/GU negative Renal ROS     Musculoskeletal   Abdominal (+) + obese,   Peds  Hematology plts 312   Anesthesia Other Findings   Reproductive/Obstetrics (+) Pregnancy Chronic HTN with possible PIH                            Anesthesia Physical Anesthesia Plan  ASA: III  Anesthesia Plan: Epidural   Post-op Pain Management:    Induction:   Airway Management Planned:   Additional Equipment:   Intra-op Plan:   Post-operative Plan:   Informed Consent: I have reviewed the patients History and Physical, chart, labs and discussed the procedure including the risks, benefits and alternatives for the proposed anesthesia with the patient or authorized representative who has indicated his/her understanding and acceptance.     Plan Discussed with:   Anesthesia Plan Comments:         Anesthesia Quick Evaluation

## 2015-04-19 NOTE — Anesthesia Procedure Notes (Signed)
Epidural Patient location during procedure: OB Start time: 04/19/2015 4:08 PM End time: 04/19/2015 4:29 PM  Staffing Anesthesiologist: Sebastian AcheMANNY, Tawanna Funk  Preanesthetic Checklist Completed: patient identified, site marked, surgical consent, pre-op evaluation, timeout performed, IV checked, risks and benefits discussed and monitors and equipment checked  Epidural Patient position: sitting Prep: DuraPrep Patient monitoring: heart rate, continuous pulse ox and blood pressure Approach: midline Location: L4-L5 Injection technique: LOR air  Needle:  Needle type: Tuohy  Needle gauge: 17 G Needle length: 9 cm and 9 Needle insertion depth: 9 cm Catheter type: closed end flexible Catheter size: 20 Guage Catheter at skin depth: 15 cm Test dose: negative  Assessment Events: blood not aspirated, injection not painful, no injection resistance, negative IV test and no paresthesia  Additional Notes   Patient tolerated the insertion well without complications.Reason for block:procedure for pain

## 2015-04-19 NOTE — Progress Notes (Signed)
Labor Progress Note (Late Entry)  Julia Hopkins is a 25 y.o. G2P0010 at 7851w3d  admitted for augmentation of labor due to worsening Blood pressures in the setting of chronic HTN. Pre-e labs are within normal limits. Hx of cocaine and marijuana use.   S: Patient is doing well. Continues to note of intermittent HA with contractions that resolve with resolution of contractions. No visual changes currently but did have early this morning at home. No RUQ pain.   O:  BP 153/89 mmHg  Pulse 89  Temp(Src) 98.7 F (37.1 C) (Oral)  Resp 18  Ht 5\' 2"  (1.575 m)  Wt 120.657 kg (266 lb)  BMI 48.64 kg/m2  SpO2 99%  LMP 07/03/2014 (Exact Date)    FHT:  FHR: 140 bpm, variability: moderate,  accelerations:  Present,  decelerations:  Absent UC:   q 7 minsSVE:   Dilation: 5 Effacement (%): 90 Station: -3 Exam by:: Dr.Wouk Membranes intact    Labs: Lab Results  Component Value Date   WBC 14.2* 04/19/2015   HGB 12.2 04/19/2015   HCT 36.2 04/19/2015   MCV 85.8 04/19/2015   PLT 309 04/19/2015    Assessment / Plan: 24 y.o. G2P0010 2451w3d SOL  Spontaneous labor, progressing normally  Labor: Progressing normally Fetal Wellbeing:  Category I Pain Control:  analgesia upon request Anticipated MOD:  NSVD  Expectant management   Palma HolterKanishka G Japleen Tornow, MD PGY1 Family Medicine

## 2015-04-20 ENCOUNTER — Other Ambulatory Visit: Payer: Medicaid Other

## 2015-04-20 MED ORDER — MAGNESIUM SULFATE 50 % IJ SOLN
2.0000 g/h | INTRAVENOUS | Status: AC
  Administered 2015-04-20: 2 g/h via INTRAVENOUS
  Filled 2015-04-20 (×2): qty 80

## 2015-04-20 MED ORDER — TETANUS-DIPHTH-ACELL PERTUSSIS 5-2.5-18.5 LF-MCG/0.5 IM SUSP
0.5000 mL | Freq: Once | INTRAMUSCULAR | Status: DC
  Filled 2015-04-20: qty 0.5

## 2015-04-20 MED ORDER — SENNOSIDES-DOCUSATE SODIUM 8.6-50 MG PO TABS
2.0000 | ORAL_TABLET | ORAL | Status: DC
  Administered 2015-04-20: 2 via ORAL
  Filled 2015-04-20: qty 2

## 2015-04-20 MED ORDER — ZOLPIDEM TARTRATE 5 MG PO TABS: 5.0000 mg | ORAL_TABLET | Freq: Every evening | ORAL | Status: DC | PRN

## 2015-04-20 MED ORDER — DIPHENHYDRAMINE HCL 25 MG PO CAPS
25.0000 mg | ORAL_CAPSULE | Freq: Four times a day (QID) | ORAL | Status: DC | PRN
  Administered 2015-04-20: 25 mg via ORAL
  Filled 2015-04-20: qty 1

## 2015-04-20 MED ORDER — LANOLIN HYDROUS EX OINT: TOPICAL_OINTMENT | CUTANEOUS | Status: DC | PRN

## 2015-04-20 MED ORDER — IBUPROFEN 600 MG PO TABS
600.0000 mg | ORAL_TABLET | Freq: Four times a day (QID) | ORAL | Status: DC
  Administered 2015-04-20 – 2015-04-21 (×7): 600 mg via ORAL
  Filled 2015-04-20 (×7): qty 1

## 2015-04-20 MED ORDER — SIMETHICONE 80 MG PO CHEW: 80.0000 mg | CHEWABLE_TABLET | ORAL | Status: DC | PRN

## 2015-04-20 MED ORDER — PNEUMOCOCCAL VAC POLYVALENT 25 MCG/0.5ML IJ INJ
0.5000 mL | INJECTION | INTRAMUSCULAR | Status: AC
  Administered 2015-04-21: 0.5 mL via INTRAMUSCULAR
  Filled 2015-04-20: qty 0.5

## 2015-04-20 MED ORDER — DIBUCAINE 1 % RE OINT: 1.0000 "application " | TOPICAL_OINTMENT | RECTAL | Status: DC | PRN

## 2015-04-20 MED ORDER — ONDANSETRON HCL 4 MG PO TABS: 4.0000 mg | ORAL_TABLET | ORAL | Status: DC | PRN

## 2015-04-20 MED ORDER — WITCH HAZEL-GLYCERIN EX PADS
1.0000 | MEDICATED_PAD | CUTANEOUS | Status: DC | PRN
Start: 2015-04-20 — End: 2015-04-21

## 2015-04-20 MED ORDER — PRENATAL MULTIVITAMIN CH
1.0000 | ORAL_TABLET | Freq: Every day | ORAL | Status: DC
  Administered 2015-04-20 – 2015-04-21 (×2): 1 via ORAL
  Filled 2015-04-20 (×2): qty 1

## 2015-04-20 MED ORDER — BENZOCAINE-MENTHOL 20-0.5 % EX AERO: 1.0000 "application " | INHALATION_SPRAY | CUTANEOUS | Status: DC | PRN

## 2015-04-20 MED ORDER — ONDANSETRON HCL 4 MG/2ML IJ SOLN: 4.0000 mg | INTRAMUSCULAR | Status: DC | PRN

## 2015-04-20 MED ORDER — LACTATED RINGERS IV SOLN: INTRAVENOUS | Status: DC

## 2015-04-20 MED ORDER — ACETAMINOPHEN 325 MG PO TABS: 650.0000 mg | ORAL_TABLET | ORAL | Status: DC | PRN

## 2015-04-20 NOTE — Anesthesia Postprocedure Evaluation (Signed)
Anesthesia Post Note  Patient: Julia Hopkins  Procedure(s) Performed: * No procedures listed *  Patient location during evaluation: Mother Baby Anesthesia Type: Epidural Level of consciousness: awake and alert and oriented Pain management: satisfactory to patient Vital Signs Assessment: post-procedure vital signs reviewed and stable Respiratory status: spontaneous breathing and nonlabored ventilation Cardiovascular status: stable Postop Assessment: no headache, no backache, no signs of nausea or vomiting, adequate PO intake and patient able to bend at knees (patient up walking) Anesthetic complications: no    Last Vitals:  Filed Vitals:   04/20/15 1113 04/20/15 1159  BP: 149/65 121/74  Pulse: 98 88  Temp: 36.9 C   Resp: 18 16    Last Pain:  Filed Vitals:   04/20/15 1233  PainSc: 0-No pain                 Starlene Consuegra

## 2015-04-20 NOTE — Lactation Note (Signed)
This note was copied from the chart of Julia Veronda PrudeKeiya Odenthal. Lactation Consultation Note  Mom states baby has been sleepy but recently had a 35 minute feeding.  Baby is currently sleeping in mother's arms.  Mom knows to feed with any feeding cue.  Encouraged to call out with concerns/assist.  Patient Name: Julia Hopkins ZOXWR'UToday's Date: 04/20/2015     Maternal Data    Feeding Feeding Type: Breast Fed Length of feed: 30 min  LATCH Score/Interventions                      Lactation Tools Discussed/Used     Consult Status      Huston FoleyMOULDEN, Celines Femia S 04/20/2015, 4:46 PM
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
## 2015-04-20 NOTE — Progress Notes (Signed)
Post Partum Day 1 Subjective: no complaints, up ad lib and tolerating PO.  Pt denies dx of HTN prior to pregnancy. She denies current HA, SOB or difficulty breathing. She is diuresing well.  Objective: Blood pressure 104/66, pulse 92, temperature 98.8 F (37.1 C), temperature source Oral, resp. rate 16, height 5\' 2"  (1.575 m), weight 266 lb (120.657 kg), last menstrual period 07/03/2014, SpO2 99 %, unknown if currently breastfeeding. I/O last 3 completed shifts: In: 3468.1 [P.O.:1615; I.V.:1853.1] Out: 1225 [Urine:950; Blood:275] Total I/O In: 490 [P.O.:240; I.V.:250] Out: 550 [Urine:550]    Physical Exam:  General: alert, cooperative and no distress  Lungs: CTA CV: RRR Lochia: appropriate Uterine Fundus: firm DVT Evaluation: No evidence of DVT seen on physical exam.   Recent Labs  04/19/15 1538 04/19/15 2345  HGB 12.2 11.2*  HCT 35.6* 33.3*    Assessment/Plan: Breastfeeding and Contraception [place] PP  Chronic HTN (dx'd in early pregnancy) with superimposed Preeclampsia  On magnesium sulfate. WIll d/c after 24 hours. BP currenlty low so BP meds stopped and will follow closely.  Prior h/o + Tox screen.  Denies recent use.  Tox negative for illicit DOB  LOS: 1 day   HARRAWAY-SMITH, Tavone Caesar 04/20/2015, 9:11 AM

## 2015-04-21 MED ORDER — AMLODIPINE BESYLATE 5 MG PO TABS: 5.0000 mg | ORAL_TABLET | Freq: Every day | ORAL | Status: DC

## 2015-04-21 MED ORDER — ENALAPRIL MALEATE 5 MG PO TABS: 5.0000 mg | ORAL_TABLET | Freq: Every day | ORAL | Status: DC

## 2015-04-21 MED ORDER — IBUPROFEN 600 MG PO TABS: 600.0000 mg | ORAL_TABLET | Freq: Four times a day (QID) | ORAL | Status: DC

## 2015-04-21 MED ORDER — AMLODIPINE BESYLATE 5 MG PO TABS
5.0000 mg | ORAL_TABLET | Freq: Once | ORAL | Status: AC
  Administered 2015-04-21: 5 mg via ORAL
  Filled 2015-04-21: qty 1

## 2015-04-21 NOTE — Lactation Note (Signed)
This note was copied from the chart of Julia Veronda PrudeKeiya Zanella. Lactation Consultation Note  Follow up visit made.  Mom attempting latching baby to breast using football hold.  Assisted with providing more support to breast and under baby.  Transitional milk easily hand expressed prior to latch.  Demonstrated to mom how to compress breast tissue for a wider latch.  Baby latched easily.  I observed baby nurse actively for 17 minutes with audible swallows. Mom very motivated but anxious and needs much reassurance.  Questions answered.   Instructed to continue post pumping and giving baby EBM.  Encouraged to call with concerns/assist prn.  Patient Name: Julia Hopkins ZOXWR'UToday's Date: 04/21/2015 Reason for consult: Follow-up assessment;Infant < 6lbs   Maternal Data    Feeding Feeding Type: Breast Fed Length of feed: 17 min  LATCH Score/Interventions Latch: Grasps breast easily, tongue down, lips flanged, rhythmical sucking.  Audible Swallowing: Spontaneous and intermittent  Type of Nipple: Everted at rest and after stimulation  Comfort (Breast/Nipple): Soft / non-tender     Hold (Positioning): Assistance needed to correctly position infant at breast and maintain latch. Intervention(s): Breastfeeding basics reviewed;Support Pillows;Position options;Skin to skin  LATCH Score: 9  Lactation Tools Discussed/Used     Consult Status Consult Status: Follow-up Date: 04/22/15 Follow-up type: In-patient    Huston FoleyMOULDEN, Arron Mcnaught S 04/21/2015, 1:21 PM

## 2015-04-21 NOTE — Progress Notes (Signed)
Discharge instructions given. Pt being discharged, but will transferring to Rm 144 where baby will become the pt. Pt ambulated to [facility]
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
## 2015-04-21 NOTE — Discharge Instructions (Signed)

## 2015-04-22 ENCOUNTER — Ambulatory Visit: Payer: Self-pay

## 2015-04-22 NOTE — Lactation Note (Signed)
This note was copied from the chart of Julia Veronda PrudeKeiya Bevel. Lactation Consultation Note  Patient Name: Julia Hopkins ZOXWR'UToday's Date: 04/22/2015 Reason for consult: Follow-up assessment Baby 62 hours old. Mom reports that she is getting almost a couple of ounces of milk when she pumps. Mom states that she cannot afford a [facility] loaner pump. Demonstrated how to use piston, and mom given a hand pump with review. Mom able to easily express transitional milk with hand expression and hand pump. Enc mom to put baby to breast first with each feeding and concentrate on nursing. Mom able to latch baby easily now that she is compressing and supporting her breast. Discussed pumping prior to latching baby if needed, and foremilk vs hind milk. Also discussed converting a sports bra for hands free pumping. Referred mom to Baby and Me booklet for number of diapers to expect by day of life and EBM storage guidelines. Mom aware of OP/BFSG and LC phone line assistance after D/C.   Maternal Data    Feeding Feeding Type: Breast Fed Length of feed: 20 min  LATCH Score/Interventions Latch: Grasps breast easily, tongue down, lips flanged, rhythmical sucking.  Audible Swallowing: Spontaneous and intermittent Intervention(s): Skin to skin  Type of Nipple: Everted at rest and after stimulation (short shaft, everts with stimulation.) Intervention(s): Hand pump  Comfort (Breast/Nipple): Soft / non-tender     Hold (Positioning): No assistance needed to correctly position infant at breast.  LATCH Score: 10  Lactation Tools Discussed/Used     Consult Status Consult Status: PRN    Julia Hopkins, [name] 04/22/2015, 1:31 PM

## 2015-04-24 ENCOUNTER — Ambulatory Visit (HOSPITAL_COMMUNITY): Payer: Medicaid Other

## 2015-04-24 ENCOUNTER — Other Ambulatory Visit: Payer: Medicaid Other

## 2015-04-27 ENCOUNTER — Other Ambulatory Visit: Payer: Medicaid Other

## 2015-04-28 ENCOUNTER — Ambulatory Visit: Payer: Medicaid Other | Admitting: *Deleted

## 2015-04-28 VITALS — BP 150/90

## 2015-04-28 DIAGNOSIS — O1002 Pre-existing essential hypertension complicating childbirth: Secondary | ICD-10-CM

## 2015-04-28 NOTE — Progress Notes (Signed)
Reviewed patients blood pressure with Dr. Marice Potter. She stated that patient needs no further intervention and can follow up as planned for pp care. Advised patient that if she develops severe headaches, visual disturbances or dizziness to go to [facility]. Patient is agreeable to this.

## 2015-04-29 ENCOUNTER — Inpatient Hospital Stay (HOSPITAL_COMMUNITY): Payer: Medicaid Other

## 2015-04-30 ENCOUNTER — Inpatient Hospital Stay (HOSPITAL_COMMUNITY): Payer: Medicaid Other

## 2015-05-01 ENCOUNTER — Ambulatory Visit (HOSPITAL_COMMUNITY): Payer: Medicaid Other

## 2015-05-02 ENCOUNTER — Other Ambulatory Visit: Payer: Self-pay

## 2015-05-02 MED ORDER — FLUTICASONE-SALMETEROL 100-50 MCG/DOSE IN AEPB: 1.0000 | INHALATION_SPRAY | Freq: Two times a day (BID) | RESPIRATORY_TRACT | Status: DC

## 2015-05-08 ENCOUNTER — Ambulatory Visit (HOSPITAL_COMMUNITY): Payer: Medicaid Other

## 2015-05-15 ENCOUNTER — Ambulatory Visit (HOSPITAL_COMMUNITY): Payer: Medicaid Other

## 2015-05-18 ENCOUNTER — Ambulatory Visit (HOSPITAL_COMMUNITY): Payer: Medicaid Other

## 2015-05-22 ENCOUNTER — Other Ambulatory Visit (HOSPITAL_COMMUNITY): Payer: Self-pay | Admitting: Advanced Practice Midwife

## 2015-05-22 ENCOUNTER — Ambulatory Visit (HOSPITAL_COMMUNITY): Payer: Medicaid Other

## 2015-05-29 ENCOUNTER — Ambulatory Visit (HOSPITAL_COMMUNITY): Payer: Medicaid Other

## 2015-06-01 ENCOUNTER — Ambulatory Visit: Payer: Medicaid Other | Admitting: Obstetrics & Gynecology

## 2015-06-05 ENCOUNTER — Ambulatory Visit (HOSPITAL_COMMUNITY): Payer: Medicaid Other

## 2015-06-23 ENCOUNTER — Other Ambulatory Visit (HOSPITAL_COMMUNITY): Payer: Self-pay | Admitting: Advanced Practice Midwife

## 2015-07-25 ENCOUNTER — Other Ambulatory Visit: Payer: Self-pay | Admitting: Advanced Practice Midwife

## 2015-07-25 ENCOUNTER — Other Ambulatory Visit: Payer: Self-pay | Admitting: Obstetrics and Gynecology

## 2016-04-08 NOTE — L&D Delivery Note (Signed)
Delivery Note At 9:00 AM a viable female was delivered via Vaginal, Spontaneous Delivery (Presentation:vertex ;OA  ).  APGAR: 8,9 ; weight  .   Placenta status:spont ,shultz .  Cord:3vc  with the following complications:none .  Cord pH: n/a  Anesthesia:epidural   Episiotomy:none   Lacerations:none   Suture Repair: n/a Est. Blood Loss 100(mL):    Mom to postpartum.  Baby to Couplet care / Skin to Skin.  Julia Hopkins 12/01/2016, 9:11 AM

## 2016-04-19 ENCOUNTER — Encounter (HOSPITAL_COMMUNITY): Payer: Self-pay

## 2016-04-19 ENCOUNTER — Emergency Department (HOSPITAL_COMMUNITY)
Admission: EM | Admit: 2016-04-19 | Discharge: 2016-04-20 | Disposition: A | Discharge status: Home / Self Care | Payer: Medicaid Other | Source: Home / Self Care

## 2016-04-19 DIAGNOSIS — F17210 Nicotine dependence, cigarettes, uncomplicated: Secondary | ICD-10-CM | POA: Diagnosis not present

## 2016-04-19 DIAGNOSIS — Z79899 Other long term (current) drug therapy: Secondary | ICD-10-CM | POA: Diagnosis not present

## 2016-04-19 DIAGNOSIS — Z7982 Long term (current) use of aspirin: Secondary | ICD-10-CM | POA: Diagnosis not present

## 2016-04-19 DIAGNOSIS — Z5321 Procedure and treatment not carried out due to patient leaving prior to being seen by health care provider: Secondary | ICD-10-CM | POA: Insufficient documentation

## 2016-04-19 DIAGNOSIS — J45909 Unspecified asthma, uncomplicated: Secondary | ICD-10-CM | POA: Diagnosis not present

## 2016-04-19 DIAGNOSIS — O99330 Smoking (tobacco) complicating pregnancy, unspecified trimester: Secondary | ICD-10-CM | POA: Diagnosis not present

## 2016-04-19 DIAGNOSIS — R05 Cough: Secondary | ICD-10-CM | POA: Insufficient documentation

## 2016-04-19 DIAGNOSIS — Z8673 Personal history of transient ischemic attack (TIA), and cerebral infarction without residual deficits: Secondary | ICD-10-CM | POA: Diagnosis not present

## 2016-04-19 DIAGNOSIS — O99519 Diseases of the respiratory system complicating pregnancy, unspecified trimester: Secondary | ICD-10-CM | POA: Diagnosis not present

## 2016-04-19 MED ORDER — ALBUTEROL SULFATE (2.5 MG/3ML) 0.083% IN NEBU
5.0000 mg | INHALATION_SOLUTION | Freq: Once | RESPIRATORY_TRACT | Status: AC
  Administered 2016-04-19: 5 mg via RESPIRATORY_TRACT

## 2016-04-19 MED ORDER — ALBUTEROL SULFATE (2.5 MG/3ML) 0.083% IN NEBU
INHALATION_SOLUTION | RESPIRATORY_TRACT | Status: AC
  Filled 2016-04-19: qty 6

## 2016-04-19 MED ORDER — ALBUTEROL SULFATE (2.5 MG/3ML) 0.083% IN NEBU: 5.0000 mg | INHALATION_SOLUTION | Freq: Once | RESPIRATORY_TRACT | Status: DC

## 2016-04-19 NOTE — ED Triage Notes (Signed)
Per Pt, Pt is coming from home with complaints of cough, congestion, and wheezing. Pt reports trying inhaler and treatments with no success. Family has been sick around patient.

## 2016-04-20 ENCOUNTER — Emergency Department (HOSPITAL_COMMUNITY): Payer: Medicaid Other

## 2016-04-20 ENCOUNTER — Emergency Department (HOSPITAL_COMMUNITY)
Admission: EM | Admit: 2016-04-20 | Discharge: 2016-04-20 | Disposition: A | Discharge status: Home / Self Care | Payer: Medicaid Other | Attending: Emergency Medicine | Admitting: Emergency Medicine

## 2016-04-20 ENCOUNTER — Encounter (HOSPITAL_COMMUNITY): Payer: Self-pay | Admitting: Emergency Medicine

## 2016-04-20 DIAGNOSIS — Z7982 Long term (current) use of aspirin: Secondary | ICD-10-CM | POA: Insufficient documentation

## 2016-04-20 DIAGNOSIS — Z3490 Encounter for supervision of normal pregnancy, unspecified, unspecified trimester: Secondary | ICD-10-CM

## 2016-04-20 DIAGNOSIS — Z79899 Other long term (current) drug therapy: Secondary | ICD-10-CM | POA: Insufficient documentation

## 2016-04-20 DIAGNOSIS — O99519 Diseases of the respiratory system complicating pregnancy, unspecified trimester: Secondary | ICD-10-CM | POA: Insufficient documentation

## 2016-04-20 DIAGNOSIS — J45901 Unspecified asthma with (acute) exacerbation: Secondary | ICD-10-CM

## 2016-04-20 DIAGNOSIS — Z8673 Personal history of transient ischemic attack (TIA), and cerebral infarction without residual deficits: Secondary | ICD-10-CM | POA: Insufficient documentation

## 2016-04-20 DIAGNOSIS — J45909 Unspecified asthma, uncomplicated: Secondary | ICD-10-CM | POA: Insufficient documentation

## 2016-04-20 DIAGNOSIS — F17210 Nicotine dependence, cigarettes, uncomplicated: Secondary | ICD-10-CM | POA: Insufficient documentation

## 2016-04-20 DIAGNOSIS — O99330 Smoking (tobacco) complicating pregnancy, unspecified trimester: Secondary | ICD-10-CM | POA: Insufficient documentation

## 2016-04-20 LAB — POC URINE PREG, ED: Preg Test, Ur: POSITIVE — AB

## 2016-04-20 MED ORDER — ALBUTEROL SULFATE (2.5 MG/3ML) 0.083% IN NEBU: 2.5000 mg | INHALATION_SOLUTION | Freq: Four times a day (QID) | RESPIRATORY_TRACT | 12 refills | Status: DC | PRN

## 2016-04-20 MED ORDER — ALBUTEROL SULFATE HFA 108 (90 BASE) MCG/ACT IN AERS: 1.0000 | INHALATION_SPRAY | Freq: Four times a day (QID) | RESPIRATORY_TRACT | 0 refills | Status: DC | PRN

## 2016-04-20 MED ORDER — IPRATROPIUM-ALBUTEROL 0.5-2.5 (3) MG/3ML IN SOLN
3.0000 mL | Freq: Once | RESPIRATORY_TRACT | Status: AC
  Administered 2016-04-20: 3 mL via RESPIRATORY_TRACT
  Filled 2016-04-20: qty 3

## 2016-04-20 MED ORDER — PREDNISONE 20 MG PO TABS
60.0000 mg | ORAL_TABLET | Freq: Once | ORAL | Status: AC
  Administered 2016-04-20: 60 mg via ORAL
  Filled 2016-04-20: qty 3

## 2016-04-20 NOTE — ED Notes (Signed)
Pt. Returned from Xray 

## 2016-04-20 NOTE — ED Triage Notes (Signed)
Pt. Stated, I also might be pregnant

## 2016-04-20 NOTE — Discharge Instructions (Signed)
We believe that your symptoms are caused today by an exacerbation of your asthma.  Please take the prescribed medications and any medications that you have at home.  Follow up with your doctor as recommended.  If you develop any new or worsening symptoms, including but not limited to fever, persistent vomiting, worsening shortness of breath, or other symptoms that concern you, please return to the Emergency Department immediately. ° ° °Asthma °Asthma is a recurring condition in which the airways tighten and narrow. Asthma can make it difficult to breathe. It can cause coughing, wheezing, and shortness of breath. Asthma episodes, also called asthma attacks, range from minor to life-threatening. Asthma cannot be cured, but medicines and lifestyle changes can help control it. °CAUSES °Asthma is believed to be caused by inherited (genetic) and environmental factors, but its exact cause is unknown. Asthma may be triggered by allergens, lung infections, or irritants in the air. Asthma triggers are different for each person. Common triggers include:  °Animal dander. °Dust mites. °Cockroaches. °Pollen from trees or grass. °Mold. °Smoke. °Air pollutants such as dust, household cleaners, hair sprays, aerosol sprays, paint fumes, strong chemicals, or strong odors. °Cold air, weather changes, and winds (which increase molds and pollens in the air). °Strong emotional expressions such as crying or laughing hard. °Stress. °Certain medicines (such as aspirin) or types of drugs (such as beta-blockers). °Sulfites in foods and drinks. Foods and drinks that may contain sulfites include dried fruit, potato chips, and sparkling grape juice. °Infections or inflammatory conditions such as the flu, a cold, or an inflammation of the nasal membranes (rhinitis). °Gastroesophageal reflux disease (GERD). °Exercise or strenuous activity. °SYMPTOMS °Symptoms may occur immediately after asthma is triggered or many hours later. Symptoms  include: °Wheezing. °Excessive nighttime or early morning coughing. °Frequent or severe coughing with a common cold. °Chest tightness. °Shortness of breath. °DIAGNOSIS  °The diagnosis of asthma is made by a review of your medical history and a physical exam. Tests may also be performed. These may include: °Lung function studies. These tests show how much air you breathe in and out. °Allergy tests. °Imaging tests such as X-rays. °TREATMENT  °Asthma cannot be cured, but it can usually be controlled. Treatment involves identifying and avoiding your asthma triggers. It also involves medicines. There are 2 classes of medicine used for asthma treatment:  °Controller medicines. These prevent asthma symptoms from occurring. They are usually taken every day. °Reliever or rescue medicines. These quickly relieve asthma symptoms. They are used as needed and provide short-term relief. °Your health care provider will help you create an asthma action plan. An asthma action plan is a written plan for managing and treating your asthma attacks. It includes a list of your asthma triggers and how they may be avoided. It also includes information on when medicines should be taken and when their dosage should be changed. An action plan may also involve the use of a device called a peak flow meter. A peak flow meter measures how well the lungs are working. It helps you monitor your condition. °HOME CARE INSTRUCTIONS  °Take medicines only as directed by your health care provider. Speak with your health care provider if you have questions about how or when to take the medicines. °Use a peak flow meter as directed by your health care provider. Record and keep track of readings. °Understand and use the action plan to help minimize or stop an asthma attack without needing to seek medical care. °Control your home environment in the following   ways to help prevent asthma attacks: °Do not smoke. Avoid being exposed to secondhand smoke. °Change  your heating and air conditioning filter regularly. °Limit your use of fireplaces and wood stoves. °Get rid of pests (such as roaches and mice) and their droppings. °Throw away plants if you see mold on them. °Clean your floors and dust regularly. Use unscented cleaning products. °Try to have someone else vacuum for you regularly. Stay out of rooms while they are being vacuumed and for a short while afterward. If you vacuum, use a dust mask from a hardware store, a double-layered or microfilter vacuum cleaner bag, or a vacuum cleaner with a HEPA filter. °Replace carpet with wood, tile, or vinyl flooring. Carpet can trap dander and dust. °Use allergy-proof pillows, mattress covers, and box spring covers. °Wash bed sheets and blankets every week in hot water and dry them in a dryer. °Use blankets that are made of polyester or cotton. °Clean bathrooms and kitchens with bleach. If possible, have someone repaint the walls in these rooms with mold-resistant paint. Keep out of the rooms that are being cleaned and painted. °Wash hands frequently. °SEEK MEDICAL CARE IF:  °You have wheezing, shortness of breath, or a cough even if taking medicine to prevent attacks. °The colored mucus you cough up (sputum) is thicker than usual. °Your sputum changes from clear or white to yellow, green, gray, or bloody. °You have any problems that may be related to the medicines you are taking (such as a rash, itching, swelling, or trouble breathing). °You are using a reliever medicine more than 2-3 times per week. °Your peak flow is still at 50-79% of your personal best after following your action plan for 1 hour. °You have a fever. °SEEK IMMEDIATE MEDICAL CARE IF:  °You seem to be getting worse and are unresponsive to treatment during an asthma attack. °You are short of breath even at rest. °You get short of breath when doing very little physical activity. °You have difficulty eating, drinking, or talking due to asthma symptoms. °You  develop chest pain. °You develop a fast heartbeat. °You have a bluish color to your lips or fingernails. °You are light-headed, dizzy, or faint. °Your peak flow is less than 50% of your personal best. °MAKE SURE YOU:  °Understand these instructions. °Will watch your condition. °Will get help right away if you are not doing well or get worse. °Document Released: 03/25/2005 Document Revised: 08/09/2013 Document Reviewed: 10/22/2012 °[employer]® Patient Information ©2015 [employer]. This information is not intended to replace advice given to you by your health care provider. Make sure you discuss any questions you have with your health care provider. ° °How to Use a Nebulizer °If you have asthma or other breathing problems, you might need to breathe in (inhale) medicine. This can be done with a nebulizer. A nebulizer is a device that turns liquid medicine into a mist that you can inhale.  °There are different kinds of nebulizers. Most are small. With some, you breathe in through a mouthpiece. With others, a mask fits over your nose and mouth. Most nebulizers must be connected to a small air compressor. Air is forced through tubing from the compressor to the nebulizer. The forced air changes the liquid into a fine spray. °RISKS AND COMPLICATIONS °The nebulizer must work properly for it to help your breathing. If the nebulizer does not produce mist, or if foam comes out, this indicates that the nebulizer is not working properly. Sometimes a filter can get clogged, or   there might be a problem with the air compressor. Check the instruction booklet that came with your nebulizer. It should tell you how to fix problems or where to call for help. You should have at least one extra nebulizer at home. That way, you will always have one when you need it.  °HOW TO PREPARE BEFORE USING THE NEBULIZER °Take these steps before using the nebulizer: °Check your medicine. Make sure it has not expired and is not damaged in any way.    °Wash your hands with soap and water.   °Put all the parts of your nebulizer on a sturdy, flat surface. Make sure the tubing connects the compressor and the nebulizer. °Measure the liquid medicine according to your health care provider's instructions. Pour it into the nebulizer. °Attach the mouthpiece or mask.   °Test the nebulizer by turning it on to make sure a spray is coming out. Then, turn it off.   °HOW TO USE THE NEBULIZER °Sit down and focus on staying relaxed.   °If your nebulizer has a mask, put it over your nose and mouth. If you use a mouthpiece, put it in your mouth. Press your lips firmly around the mouthpiece. °Turn on the nebulizer.   °Breathe out.   °Some nebulizers have a finger valve. If yours does, cover up the air hole so the air gets to the nebulizer. °Once the medicine begins to mist out, take slow, deep breaths. If there is a finger valve, release it at the end of your breath. °Continue taking slow, deep breaths until the nebulizer is empty.   °Be sure to stop the machine at any point if you start coughing or if the medicine foams or bubbles. °HOW TO CLEAN THE NEBULIZER  °The nebulizer and all its parts must be kept very clean. Follow the manufacturer's instructions for cleaning. For most nebulizers, you should follow these guidelines: °Wash the nebulizer after each use. Use warm water and soap. Rinse it well. Shake the nebulizer to remove extra water. Put it on a clean towel until it is completely dry. To make sure it is dry, put the nebulizer back together. Turn on the compressor for a few minutes. This will blow air through the nebulizer.   °Do not wash the tubing or the finger valve.   °Store the nebulizer in a dust-free place.   °Inspect the filter every week. Replace it any time it looks dirty.   °Sometimes the nebulizer will need a more complete cleaning. The instruction booklet should say how often you need to do this. °SEEK MEDICAL CARE IF:  °You continue to have difficulty  breathing.   °You have trouble using the nebulizer.   °Document Released: 03/13/2009 Document Revised: 08/09/2013 Document Reviewed: 09/14/2012 °[employer]® Patient Information ©2015 [employer]. This information is not intended to replace advice given to you by your health care provider. Make sure you discuss any questions you have with your health care provider. ° °How to Use an Inhaler °Proper inhaler technique is very important. Good technique ensures that the medicine reaches the lungs. Poor technique results in depositing the medicine on the tongue and back of the throat rather than in the airways. If you do not use the inhaler with good technique, the medicine will not help you. °STEPS TO FOLLOW IF USING AN INHALER WITHOUT AN EXTENSION TUBE °Remove the cap from the inhaler. °If you are using the inhaler for the first time, you will need to prime it. Shake the inhaler for   5 seconds and release four puffs into the air, away from your face. Ask your health care provider or pharmacist if you have questions about priming your inhaler. °Shake the inhaler for 5 seconds before each breath in (inhalation). °Position the inhaler so that the top of the canister faces up. °Put your index finger on the top of the medicine canister. Your thumb supports the bottom of the inhaler. °Open your mouth. °Either place the inhaler between your teeth and place your lips tightly around the mouthpiece, or hold the inhaler 1-2 inches away from your open mouth. If you are unsure of which technique to use, ask your health care provider. °Breathe out (exhale) normally and as completely as possible. °Press the canister down with your index finger to release the medicine. °At the same time as the canister is pressed, inhale deeply and slowly until your lungs are completely filled. This should take 4-6 seconds. Keep your tongue down. °Hold the medicine in your lungs for 5-10 seconds (10 seconds is best). This helps the medicine get into the  small airways of your lungs. °Breathe out slowly, through pursed lips. Whistling is an example of pursed lips. °Wait at least 15-30 seconds between puffs. Continue with the above steps until you have taken the number of puffs your health care provider has ordered. Do not use the inhaler more than your health care provider tells you. °Replace the cap on the inhaler. °Follow the directions from your health care provider or the inhaler insert for cleaning the inhaler. °STEPS TO FOLLOW IF USING AN INHALER WITH AN EXTENSION (SPACER) °Remove the cap from the inhaler. °If you are using the inhaler for the first time, you will need to prime it. Shake the inhaler for 5 seconds and release four puffs into the air, away from your face. Ask your health care provider or pharmacist if you have questions about priming your inhaler. °Shake the inhaler for 5 seconds before each breath in (inhalation). °Place the open end of the spacer onto the mouthpiece of the inhaler. °Position the inhaler so that the top of the canister faces up and the spacer mouthpiece faces you. °Put your index finger on the top of the medicine canister. Your thumb supports the bottom of the inhaler and the spacer. °Breathe out (exhale) normally and as completely as possible. °Immediately after exhaling, place the spacer between your teeth and into your mouth. Close your lips tightly around the spacer. °Press the canister down with your index finger to release the medicine. °At the same time as the canister is pressed, inhale deeply and slowly until your lungs are completely filled. This should take 4-6 seconds. Keep your tongue down and out of the way. °Hold the medicine in your lungs for 5-10 seconds (10 seconds is best). This helps the medicine get into the small airways of your lungs. Exhale. °Repeat inhaling deeply through the spacer mouthpiece. Again hold that breath for up to 10 seconds (10 seconds is best). Exhale slowly. If it is difficult to take  this second deep breath through the spacer, breathe normally several times through the spacer. Remove the spacer from your mouth. °Wait at least 15-30 seconds between puffs. Continue with the above steps until you have taken the number of puffs your health care provider has ordered. Do not use the inhaler more than your health care provider tells you. °Remove the spacer from the inhaler, and place the cap on the inhaler. °Follow the directions from your health care provider   or the inhaler insert for cleaning the inhaler and spacer. °If you are using different kinds of inhalers, use your quick relief medicine to open the airways 10-15 minutes before using a steroid if instructed to do so by your health care provider. If you are unsure which inhalers to use and the order of using them, ask your health care provider, nurse, or respiratory therapist. °If you are using a steroid inhaler, always rinse your mouth with water after your last puff, then gargle and spit out the water. Do not swallow the water. °AVOID: °Inhaling before or after starting the spray of medicine. It takes practice to coordinate your breathing with triggering the spray. °Inhaling through the nose (rather than the mouth) when triggering the spray. °HOW TO DETERMINE IF YOUR INHALER IS FULL OR NEARLY EMPTY °You cannot know when an inhaler is empty by shaking it. A few inhalers are now being made with dose counters. Ask your health care provider for a prescription that has a dose counter if you feel you need that extra help. If your inhaler does not have a counter, ask your health care provider to help you determine the date you need to refill your inhaler. Write the refill date on a calendar or your inhaler canister. Refill your inhaler 7-10 days before it runs out. Be sure to keep an adequate supply of medicine. This includes making sure it is not expired, and that you have a spare inhaler.  °SEEK MEDICAL CARE IF:  °Your symptoms are only partially  relieved with your inhaler. °You are having trouble using your inhaler. °You have some increase in phlegm. °SEEK IMMEDIATE MEDICAL CARE IF:  °You feel little or no relief with your inhalers. You are still wheezing and are feeling shortness of breath or tightness in your chest or both. °You have dizziness, headaches, or a fast heart rate. °You have chills, fever, or night sweats. °You have a noticeable increase in phlegm production, or there is blood in the phlegm. °MAKE SURE YOU:  °Understand these instructions. °Will watch your condition. °Will get help right away if you are not doing well or get worse. °Document Released: 03/22/2000 Document Revised: 01/13/2013 Document Reviewed: 10/22/2012 °[employer]® Patient Information ©2015 [employer]. This information is not intended to replace advice given to you by your health care provider. Make sure you discuss any questions you have with your health care provider. ° ° ° °

## 2016-04-20 NOTE — ED Triage Notes (Signed)
Pt. Stated, Ive had asthma for a week. I came last night and got a treatment and left cause it was too long to wait. Im still having wheezing and tightness.
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
## 2016-04-20 NOTE — ED Notes (Signed)
Urine sent down to main lab to be held.

## 2016-05-14 ENCOUNTER — Telehealth: Payer: Self-pay | Admitting: *Deleted

## 2016-05-14 DIAGNOSIS — O0990 Supervision of high risk pregnancy, unspecified, unspecified trimester: Secondary | ICD-10-CM

## 2016-05-14 MED ORDER — TARON-C DHA 53.5-38-1 MG PO CAPS: 1.0000 | ORAL_CAPSULE | Freq: Every day | ORAL | 4 refills | Status: DC

## 2016-05-14 NOTE — Telephone Encounter (Signed)
[facility] [facility]. Refill request for prenatal vitamins. Refill sent.

## 2016-05-27 LAB — OB RESULTS CONSOLE RPR: RPR: NONREACTIVE

## 2016-05-27 LAB — OB RESULTS CONSOLE GC/CHLAMYDIA
Chlamydia: NEGATIVE
Gonorrhea: NEGATIVE

## 2016-05-27 LAB — OB RESULTS CONSOLE VARICELLA ZOSTER ANTIBODY, IGG: Varicella: IMMUNE

## 2016-05-27 LAB — OB RESULTS CONSOLE ABO/RH: RH Type: POSITIVE

## 2016-05-27 LAB — OB RESULTS CONSOLE PLATELET COUNT: Platelets: 303 10*3/uL

## 2016-05-27 LAB — OB RESULTS CONSOLE HEPATITIS B SURFACE ANTIGEN: Hepatitis B Surface Ag: NEGATIVE

## 2016-05-27 LAB — OB RESULTS CONSOLE ANTIBODY SCREEN: Antibody Screen: NEGATIVE

## 2016-05-27 LAB — OB RESULTS CONSOLE HGB/HCT, BLOOD
HCT: 36 %
Hemoglobin: 12.1 g/dL

## 2016-05-27 LAB — OB RESULTS CONSOLE RUBELLA ANTIBODY, IGM: Rubella: IMMUNE

## 2016-05-27 LAB — OB RESULTS CONSOLE HIV ANTIBODY (ROUTINE TESTING): HIV: NONREACTIVE

## 2016-05-28 ENCOUNTER — Other Ambulatory Visit (HOSPITAL_COMMUNITY): Payer: Self-pay | Admitting: Nurse Practitioner

## 2016-05-28 DIAGNOSIS — Z3A13 13 weeks gestation of pregnancy: Secondary | ICD-10-CM

## 2016-05-28 DIAGNOSIS — Z3682 Encounter for antenatal screening for nuchal translucency: Secondary | ICD-10-CM

## 2016-06-03 ENCOUNTER — Encounter (HOSPITAL_COMMUNITY): Payer: Self-pay | Admitting: *Deleted

## 2016-06-04 ENCOUNTER — Encounter (HOSPITAL_COMMUNITY): Payer: Self-pay

## 2016-06-04 ENCOUNTER — Ambulatory Visit (HOSPITAL_COMMUNITY)
Admission: RE | Admit: 2016-06-04 | Discharge: 2016-06-04 | Disposition: A | Discharge status: Home / Self Care | Payer: Medicaid Other | Source: Ambulatory Visit | Attending: Nurse Practitioner | Admitting: Nurse Practitioner

## 2016-06-04 ENCOUNTER — Other Ambulatory Visit (HOSPITAL_COMMUNITY): Payer: Self-pay | Admitting: *Deleted

## 2016-06-04 ENCOUNTER — Other Ambulatory Visit (HOSPITAL_COMMUNITY): Payer: Self-pay | Admitting: Nurse Practitioner

## 2016-06-04 ENCOUNTER — Ambulatory Visit (HOSPITAL_COMMUNITY): Admission: RE | Admit: 2016-06-04 | Payer: Medicaid Other | Source: Ambulatory Visit

## 2016-06-04 DIAGNOSIS — Z369 Encounter for antenatal screening, unspecified: Secondary | ICD-10-CM

## 2016-06-04 DIAGNOSIS — Z3A13 13 weeks gestation of pregnancy: Secondary | ICD-10-CM

## 2016-06-04 DIAGNOSIS — Z3687 Encounter for antenatal screening for uncertain dates: Secondary | ICD-10-CM | POA: Insufficient documentation

## 2016-06-04 DIAGNOSIS — Z3A10 10 weeks gestation of pregnancy: Secondary | ICD-10-CM | POA: Diagnosis not present

## 2016-06-04 DIAGNOSIS — O10011 Pre-existing essential hypertension complicating pregnancy, first trimester: Secondary | ICD-10-CM | POA: Insufficient documentation

## 2016-06-04 DIAGNOSIS — Z3682 Encounter for antenatal screening for nuchal translucency: Secondary | ICD-10-CM

## 2016-06-04 DIAGNOSIS — O99211 Obesity complicating pregnancy, first trimester: Secondary | ICD-10-CM

## 2016-06-07 ENCOUNTER — Encounter: Payer: Self-pay | Admitting: *Deleted

## 2016-06-11 ENCOUNTER — Encounter: Payer: Self-pay | Admitting: *Deleted

## 2016-06-11 ENCOUNTER — Encounter: Payer: Self-pay | Admitting: Obstetrics and Gynecology

## 2016-06-11 ENCOUNTER — Ambulatory Visit (INDEPENDENT_AMBULATORY_CARE_PROVIDER_SITE_OTHER): Payer: Medicaid Other | Admitting: Obstetrics and Gynecology

## 2016-06-11 VITALS — BP 129/72 | HR 89 | Wt 250.0 lb

## 2016-06-11 DIAGNOSIS — E669 Obesity, unspecified: Secondary | ICD-10-CM | POA: Diagnosis not present

## 2016-06-11 DIAGNOSIS — O99331 Smoking (tobacco) complicating pregnancy, first trimester: Secondary | ICD-10-CM | POA: Diagnosis not present

## 2016-06-11 DIAGNOSIS — O99211 Obesity complicating pregnancy, first trimester: Secondary | ICD-10-CM | POA: Diagnosis not present

## 2016-06-11 DIAGNOSIS — O99213 Obesity complicating pregnancy, third trimester: Secondary | ICD-10-CM

## 2016-06-11 DIAGNOSIS — Z8759 Personal history of other complications of pregnancy, childbirth and the puerperium: Secondary | ICD-10-CM | POA: Insufficient documentation

## 2016-06-11 DIAGNOSIS — Z23 Encounter for immunization: Secondary | ICD-10-CM | POA: Diagnosis not present

## 2016-06-11 DIAGNOSIS — O99320 Drug use complicating pregnancy, unspecified trimester: Secondary | ICD-10-CM

## 2016-06-11 DIAGNOSIS — O10911 Unspecified pre-existing hypertension complicating pregnancy, first trimester: Secondary | ICD-10-CM | POA: Diagnosis present

## 2016-06-11 DIAGNOSIS — O10919 Unspecified pre-existing hypertension complicating pregnancy, unspecified trimester: Secondary | ICD-10-CM

## 2016-06-11 DIAGNOSIS — O99332 Smoking (tobacco) complicating pregnancy, second trimester: Secondary | ICD-10-CM

## 2016-06-11 DIAGNOSIS — O0990 Supervision of high risk pregnancy, unspecified, unspecified trimester: Secondary | ICD-10-CM | POA: Insufficient documentation

## 2016-06-11 MED ORDER — ASPIRIN EC 81 MG PO TBEC: 81.0000 mg | DELAYED_RELEASE_TABLET | Freq: Every day | ORAL | 6 refills | Status: DC

## 2016-06-11 NOTE — Progress Notes (Signed)
   PRENATAL VISIT NOTE  Subjective:  Julia Hopkins is a 26 y.o. G3P1011 at 67w6dbeing seen today for initial prenatal care.  She is currently monitored for the following issues for this high-risk pregnancy and has Hypertension; Chronic hypertension during pregnancy, antepartum; Tobacco smoking affecting pregnancy, antepartum, second trimester; Obesity affecting pregnancy in third trimester; Supervision of high risk pregnancy, antepartum; and History of pre-eclampsia on her problem list.  Patient reports nausea and decreased appetite.  Contractions: Not present. Vag. Bleeding: None.  Movement: Absent. Denies leaking of fluid.   The following portions of the patient's history were reviewed and updated as appropriate: allergies, current medications, past family history, past medical history, past social history, past surgical history and problem list. Problem list updated.  Objective:   Vitals:   06/11/16 0802  BP: 129/72  Pulse: 89  Weight: 250 lb (113.4 kg)    Fetal Status:     Movement: Absent     General:  Alert, oriented and cooperative. Patient is in no acute distress.  Skin: Skin is warm and dry. No rash noted.   Cardiovascular: Normal heart rate noted  Respiratory: Normal respiratory effort, no problems with respiration noted  Abdomen: Soft, gravid, appropriate for gestational age. Pain/Pressure: Present     Pelvic:  Cervical exam deferred        Extremities: Normal range of motion.  Edema: Trace  Mental Status: Normal mood and affect. Normal behavior. Normal judgment and thought content.   Assessment and Plan:  Pregnancy: [id number] 26w6d1. Supervision of high risk pregnancy, antepartum - labs up to date - Flu Vaccine QUAD 36+ mos IM (Fluarix, Quad PF) -first trimester screen scheduled  3. Tobacco smoking affecting pregnancy, antepartum, second trimester -smoking 2 cigs a day encouraged cessation   4. Obesity (BMI 30-39.9) -patient has been walking and is working on  maintaining weight and healthy lifestyle habits.  6. History of pre-eclampsia Base line labs ordered Start baby ASA  7. Chronic hypertension during pregnancy, antepartum -no meds since last delivery. nomal bp now will monitor - Comp Met (CMET) - Protein / Creatinine Ratio, Urine -on baby asa  Preterm labor symptoms and general obstetric precautions including but not limited to vaginal bleeding, contractions, leaking of fluid and fetal movement were reviewed in detail with the patient. Please refer to After Visit Summary for other counseling recommendations.  No Follow-up on file.   NiWaldemar DickensMD

## 2016-06-11 NOTE — Patient Instructions (Signed)
First Trimester of Pregnancy The first trimester of pregnancy is from week 1 until the end of week 13 (months 1 through 3). During this time, your baby will begin to develop inside you. At 6-8 weeks, the eyes and face are formed, and the heartbeat can be seen on ultrasound. At the end of 12 weeks, all the baby's organs are formed. Prenatal care is all the medical care you receive before the birth of your baby. Make sure you get good prenatal care and follow all of your doctor's instructions. Follow these instructions at home: Medicines  Take over-the-counter and prescription medicines only as told by your doctor. Some medicines are safe and some medicines are not safe during pregnancy.  Take a prenatal vitamin that contains at least 600 micrograms (mcg) of folic acid.  If you have trouble pooping (constipation), take medicine that will make your stool soft (stool softener) if your doctor approves. Eating and drinking  Eat regular, healthy meals.  Your doctor will tell you the amount of weight gain that is right for you.  Avoid raw meat and uncooked cheese.  If you feel sick to your stomach (nauseous) or throw up (vomit): ? Eat 4 or 5 small meals a day instead of 3 large meals. ? Try eating a few soda crackers. ? Drink liquids between meals instead of during meals.  To prevent constipation: ? Eat foods that are high in fiber, like fresh fruits and vegetables, whole grains, and beans. ? Drink enough fluids to keep your pee (urine) clear or pale yellow. Activity  Exercise only as told by your doctor. Stop exercising if you have cramps or pain in your lower belly (abdomen) or low back.  Do not exercise if it is too hot, too humid, or if you are in a place of great height (high altitude).  Try to avoid standing for long periods of time. Move your legs often if you must stand in one place for a long time.  Avoid heavy lifting.  Wear low-heeled shoes. Sit and stand up straight.  You  can have sex unless your doctor tells you not to. Relieving pain and discomfort  Wear a good support bra if your breasts are sore.  Take warm water baths (sitz baths) to soothe pain or discomfort caused by hemorrhoids. Use hemorrhoid cream if your doctor says it is okay.  Rest with your legs raised if you have leg cramps or low back pain.  If you have puffy, bulging veins (varicose veins) in your legs: ? Wear support hose or compression stockings as told by your doctor. ? Raise (elevate) your feet for 15 minutes, 3-4 times a day. ? Limit salt in your food. Prenatal care  Schedule your prenatal visits by the twelfth week of pregnancy.  Write down your questions. Take them to your prenatal visits.  Keep all your prenatal visits as told by your doctor. This is important. Safety  Wear your seat belt at all times when driving.  Make a list of emergency phone numbers. The list should include numbers for family, friends, the hospital, and police and fire departments. General instructions  Ask your doctor for a referral to a local prenatal class. Begin classes no later than at the start of month 6 of your pregnancy.  Ask for help if you need counseling or if you need help with nutrition. Your doctor can give you advice or tell you where to go for help.  Do not use hot tubs, steam rooms, or   saunas.  Do not douche or use tampons or scented sanitary pads.  Do not cross your legs for long periods of time.  Avoid all herbs and alcohol. Avoid drugs that are not approved by your doctor.  Do not use any tobacco products, including cigarettes, chewing tobacco, and electronic cigarettes. If you need help quitting, ask your doctor. You may get counseling or other support to help you quit.  Avoid cat litter boxes and soil used by cats. These carry germs that can cause birth defects in the baby and can cause a loss of your baby (miscarriage) or stillbirth.  Visit your dentist. At home, brush  your teeth with a soft toothbrush. Be gentle when you floss. Contact a doctor if:  You are dizzy.  You have mild cramps or pressure in your lower belly.  You have a nagging pain in your belly area.  You continue to feel sick to your stomach, you throw up, or you have watery poop (diarrhea).  You have a bad smelling fluid coming from your vagina.  You have pain when you pee (urinate).  You have increased puffiness (swelling) in your face, hands, legs, or ankles. Get help right away if:  You have a fever.  You are leaking fluid from your vagina.  You have spotting or bleeding from your vagina.  You have very bad belly cramping or pain.  You gain or lose weight rapidly.  You throw up blood. It may look like coffee grounds.  You are around people who have German measles, fifth disease, or chickenpox.  You have a very bad headache.  You have shortness of breath.  You have any kind of trauma, such as from a fall or a car accident. Summary  The first trimester of pregnancy is from week 1 until the end of week 13 (months 1 through 3).  To take care of yourself and your unborn baby, you will need to eat healthy meals, take medicines only if your doctor tells you to do so, and do activities that are safe for you and your baby.  Keep all follow-up visits as told by your doctor. This is important as your doctor will have to ensure that your baby is healthy and growing well. This information is not intended to replace advice given to you by your health care provider. Make sure you discuss any questions you have with your health care provider. Document Released: 09/11/2007 Document Revised: 04/02/2016 Document Reviewed: 04/02/2016 Elsevier Interactive Patient Education  2017 Elsevier Inc.  

## 2016-06-12 LAB — COMPREHENSIVE METABOLIC PANEL
ALT: 16 IU/L (ref 0–32)
AST: 11 IU/L (ref 0–40)
Albumin/Globulin Ratio: 1.5 (ref 1.2–2.2)
Albumin: 4 g/dL (ref 3.5–5.5)
Alkaline Phosphatase: 68 IU/L (ref 39–117)
BUN/Creatinine Ratio: 12 (ref 9–23)
BUN: 8 mg/dL (ref 6–20)
Bilirubin Total: <0.2 mg/dL (ref 0.0–1.2)
CO2: 22 mmol/L (ref 18–29)
Calcium: 9.5 mg/dL (ref 8.7–10.2)
Chloride: 100 mmol/L (ref 96–106)
Creatinine, Ser: 0.69 mg/dL (ref 0.57–1.00)
GFR calc Af Amer: 140 mL/min/{1.73_m2} (ref >59)
GFR calc non Af Amer: 121 mL/min/{1.73_m2} (ref >59)
Globulin, Total: 2.7 g/dL (ref 1.5–4.5)
Glucose: 72 mg/dL (ref 65–99)
Potassium: 3.9 mmol/L (ref 3.5–5.2)
Sodium: 139 mmol/L (ref 134–144)
Total Protein: 6.7 g/dL (ref 6.0–8.5)

## 2016-06-12 LAB — PROTEIN / CREATININE RATIO, URINE
Creatinine, Urine: 230.3 mg/dL
Protein, Ur: 18 mg/dL
Protein/Creat Ratio: 78 mg/g creat (ref 0–200)

## 2016-06-20 ENCOUNTER — Ambulatory Visit (HOSPITAL_COMMUNITY)
Admission: RE | Admit: 2016-06-20 | Discharge: 2016-06-20 | Disposition: A | Discharge status: Home / Self Care | Payer: Medicaid Other | Source: Ambulatory Visit | Attending: Nurse Practitioner | Admitting: Nurse Practitioner

## 2016-06-20 ENCOUNTER — Other Ambulatory Visit: Payer: Self-pay

## 2016-06-20 ENCOUNTER — Encounter (HOSPITAL_COMMUNITY): Payer: Self-pay

## 2016-06-20 ENCOUNTER — Other Ambulatory Visit (HOSPITAL_COMMUNITY): Payer: Medicaid Other

## 2016-06-20 ENCOUNTER — Ambulatory Visit (HOSPITAL_COMMUNITY): Payer: Medicaid Other

## 2016-06-20 DIAGNOSIS — Z3A13 13 weeks gestation of pregnancy: Secondary | ICD-10-CM | POA: Diagnosis not present

## 2016-06-20 DIAGNOSIS — O10011 Pre-existing essential hypertension complicating pregnancy, first trimester: Secondary | ICD-10-CM | POA: Insufficient documentation

## 2016-06-20 DIAGNOSIS — Z3682 Encounter for antenatal screening for nuchal translucency: Secondary | ICD-10-CM | POA: Diagnosis present

## 2016-06-20 DIAGNOSIS — O99211 Obesity complicating pregnancy, first trimester: Secondary | ICD-10-CM | POA: Diagnosis not present

## 2016-06-20 DIAGNOSIS — Z369 Encounter for antenatal screening, unspecified: Secondary | ICD-10-CM

## 2016-07-10 ENCOUNTER — Encounter: Payer: Self-pay | Admitting: General Practice

## 2016-07-10 ENCOUNTER — Encounter: Payer: Medicaid Other | Admitting: Obstetrics and Gynecology

## 2016-07-23 ENCOUNTER — Ambulatory Visit (INDEPENDENT_AMBULATORY_CARE_PROVIDER_SITE_OTHER): Payer: Medicaid Other | Admitting: Family Medicine

## 2016-07-23 VITALS — BP 122/61 | HR 88 | Wt 245.0 lb

## 2016-07-23 DIAGNOSIS — O99332 Smoking (tobacco) complicating pregnancy, second trimester: Secondary | ICD-10-CM

## 2016-07-23 DIAGNOSIS — O10919 Unspecified pre-existing hypertension complicating pregnancy, unspecified trimester: Secondary | ICD-10-CM

## 2016-07-23 DIAGNOSIS — O0990 Supervision of high risk pregnancy, unspecified, unspecified trimester: Secondary | ICD-10-CM

## 2016-07-23 DIAGNOSIS — Z8759 Personal history of other complications of pregnancy, childbirth and the puerperium: Secondary | ICD-10-CM

## 2016-07-23 DIAGNOSIS — O0992 Supervision of high risk pregnancy, unspecified, second trimester: Secondary | ICD-10-CM

## 2016-07-23 DIAGNOSIS — O99213 Obesity complicating pregnancy, third trimester: Secondary | ICD-10-CM

## 2016-07-23 DIAGNOSIS — O10912 Unspecified pre-existing hypertension complicating pregnancy, second trimester: Secondary | ICD-10-CM

## 2016-07-23 DIAGNOSIS — R87610 Atypical squamous cells of undetermined significance on cytologic smear of cervix (ASC-US): Secondary | ICD-10-CM | POA: Insufficient documentation

## 2016-07-23 DIAGNOSIS — O99212 Obesity complicating pregnancy, second trimester: Secondary | ICD-10-CM

## 2016-07-23 NOTE — Progress Notes (Signed)
      Subjective:  Julia Hopkins is a 26 y.o. G3P1011 at [redacted]w[redacted]d being seen today for ongoing prenatal care.  She is currently monitored for the following issues for this high-risk pregnancy and has Hypertension; Chronic hypertension during pregnancy, antepartum; Tobacco smoking affecting pregnancy, antepartum, second trimester; Obesity affecting pregnancy in third trimester; Supervision of high risk pregnancy, antepartum; History of pre-eclampsia; and ASCUS of cervix with negative high risk HPV on her problem list.  Patient reports concern that her BP is elevated at home, having "symptoms of preeclampsia" like seeing stars and white blotches, lightheaded, poor balance. Symptoms last about 20 min, sits down, does relaxation techniques. Occurred 3 times last week. . Reviewed ?History of stroke (per patient story, unlikely actual stroke? Records to be obtained from [facility] in [place], [place]. In 2013). Patient states her symptoms she's been having are somewhat similar, but does not want to take ambulance due to cost, reminded patient that it is important to seek medical care if thinking having stroke or symptoms of preeclampsia later in pregnancy, due to result if does not could be deadly or serious. Patient understood. Movement: Present. Denies leaking of fluid.   The following portions of the patient's history were reviewed and updated as appropriate: allergies, current medications, past family history, past medical history, past social history, past surgical history and problem list. Problem list updated.  Objective:   Vitals:   07/23/16 1410  BP: 122/61  Pulse: 88  Weight: 245 lb (111.1 kg)    Fetal Status:   Fundal Height: 17 cm Movement: Present      General:  Alert, oriented and cooperative. Patient is in no acute distress.  Skin: Skin is warm and dry. No rash noted.   Cardiovascular: Normal heart rate noted  Respiratory: Normal respiratory effort, no problems with respiration noted   Abdomen: Soft, gravid, appropriate for gestational age.       Pelvic:  Cervical exam deferred        Extremities: Normal range of motion.  Edema: None  Mental Status: Normal mood and affect. Normal behavior. Normal judgment and thought content.   Urinalysis:      Assessment and Plan:  Pregnancy: G3P1011 at [redacted]w[redacted]d  1. Supervision of high risk pregnancy, antepartum - MSAFP to be performed - Anatomy scan being scheduled  2. Chronic hypertension during pregnancy, antepartum - Baby ASA daily - BPs stable today - Discussed quitting smoking - Will buy BP cuff and write down readings and take randomly at home 1 time per day, bring to next appt. If symptoms occur, take BP reading also.   3. Tobacco smoking affecting pregnancy, antepartum, second trimester - Smoking 2-5 cig/day - Encouraged cessation given health history - Would like to have BHT intervention   4. Obesity affecting pregnancy in third trimester - States she is losing weight but eating normally  5. History of pre-eclampsia - Taking baby ASA  6. ASCUS and Neg HRHPV - Co-testing in 3 years   MOC: Nexplanon MOF: Both breast and bottle, likely bottle Preterm labor symptoms and general obstetric precautions including but not limited to vaginal bleeding, contractions, leaking of fluid and fetal movement were reviewed in detail with the patient. Please refer to After Visit Summary for other counseling recommendations.  Return in about 4 weeks (around 08/20/2016) for Routine OB visit.   Jen Mow, DO OB Fellow [facility], [facility]

## 2016-07-23 NOTE — Patient Instructions (Signed)
Hypertension During Pregnancy Hypertension is also called high blood pressure. High blood pressure means that the force of your blood moving in your body is too strong. When you are pregnant, this condition should be watched carefully. It can cause problems for you and your baby. Follow these instructions at home: Eating and drinking  Drink enough fluid to keep your pee (urine) clear or pale yellow.  Eat healthy foods that are low in salt (sodium). ? Do not add salt to your food. ? Check labels on foods and drinks to see much salt is in them. Look on the label where you see "Sodium." Lifestyle  Do not use any products that contain nicotine or tobacco, such as cigarettes and e-cigarettes. If you need help quitting, ask your doctor.  Do not use alcohol.  Avoid caffeine.  Avoid stress. Rest and get plenty of sleep. General instructions  Take over-the-counter and prescription medicines only as told by your doctor.  While lying down, lie on your left side. This keeps pressure off your baby.  While sitting or lying down, raise (elevate) your feet. Try putting some pillows under your lower legs.  Exercise regularly. Ask your doctor what kinds of exercise are best for you.  Keep all prenatal and follow-up visits as told by your doctor. This is important. Contact a doctor if:  You have symptoms that your doctor told you to watch for, such as: ? Fever. ? Throwing up (vomiting). ? Headache. Get help right away if:  You have very bad pain in your belly (abdomen).  You are throwing up, and this does not get better with treatment.  You suddenly get swelling in your hands, ankles, or face.  You gain 4 lb (1.8 kg) or more in 1 week.  You get bleeding from your vagina.  You have blood in your pee.  You do not feel your baby moving as much as normal.  You have a change in vision.  You have muscle twitching or sudden tightening (spasms).  You have trouble breathing.  Your lips  or fingernails turn blue. This information is not intended to replace advice given to you by your health care provider. Make sure you discuss any questions you have with your health care provider. Document Released: 04/27/2010 Document Revised: 12/05/2015 Document Reviewed: 12/05/2015 Elsevier Interactive Patient Education  2017 Elsevier Inc.  

## 2016-08-01 LAB — AFP, QUAD SCREEN
DIA Mom Value: 0.85
DIA Value (EIA): 139.64 pg/mL
DSR (By Age)    1 IN: 987
DSR (Second Trimester) 1 IN: 1836
Gestational Age: 20.9 WEEKS
MSAFP Mom: 0.6
MSAFP: 30.7 ng/mL
MSHCG Mom: 0.76
MSHCG: 12632 m[IU]/mL
Maternal Age At EDD: 25.9 yr
Osb Risk: 10000
T18 (By Age): 1:3847 {titer}
Test Results:: NEGATIVE
Weight: 254 [lb_av]
uE3 Mom: 0.32
uE3 Value: 0.57 ng/mL

## 2016-08-05 ENCOUNTER — Other Ambulatory Visit: Payer: Self-pay | Admitting: Family Medicine

## 2016-08-05 ENCOUNTER — Ambulatory Visit (HOSPITAL_COMMUNITY)
Admission: RE | Admit: 2016-08-05 | Discharge: 2016-08-05 | Disposition: A | Discharge status: Home / Self Care | Payer: Medicaid Other | Source: Ambulatory Visit | Attending: Family Medicine | Admitting: Family Medicine

## 2016-08-05 DIAGNOSIS — O99212 Obesity complicating pregnancy, second trimester: Secondary | ICD-10-CM | POA: Diagnosis not present

## 2016-08-05 DIAGNOSIS — O1012 Pre-existing hypertensive heart disease complicating childbirth: Secondary | ICD-10-CM | POA: Insufficient documentation

## 2016-08-05 DIAGNOSIS — O0990 Supervision of high risk pregnancy, unspecified, unspecified trimester: Secondary | ICD-10-CM

## 2016-08-05 DIAGNOSIS — Z6841 Body Mass Index (BMI) 40.0 and over, adult: Secondary | ICD-10-CM | POA: Diagnosis not present

## 2016-08-05 DIAGNOSIS — O289 Unspecified abnormal findings on antenatal screening of mother: Secondary | ICD-10-CM | POA: Insufficient documentation

## 2016-08-05 DIAGNOSIS — O0992 Supervision of high risk pregnancy, unspecified, second trimester: Secondary | ICD-10-CM | POA: Diagnosis not present

## 2016-08-05 DIAGNOSIS — Z3A19 19 weeks gestation of pregnancy: Secondary | ICD-10-CM | POA: Diagnosis not present

## 2016-08-08 ENCOUNTER — Encounter (HOSPITAL_COMMUNITY): Payer: Self-pay | Admitting: Emergency Medicine

## 2016-08-08 ENCOUNTER — Emergency Department (HOSPITAL_COMMUNITY)
Admission: EM | Admit: 2016-08-08 | Discharge: 2016-08-08 | Disposition: A | Discharge status: Home / Self Care | Payer: Medicaid Other | Attending: Emergency Medicine | Admitting: Emergency Medicine

## 2016-08-08 DIAGNOSIS — O9A212 Injury, poisoning and certain other consequences of external causes complicating pregnancy, second trimester: Secondary | ICD-10-CM | POA: Insufficient documentation

## 2016-08-08 DIAGNOSIS — S4991XA Unspecified injury of right shoulder and upper arm, initial encounter: Secondary | ICD-10-CM | POA: Diagnosis not present

## 2016-08-08 DIAGNOSIS — Y929 Unspecified place or not applicable: Secondary | ICD-10-CM | POA: Insufficient documentation

## 2016-08-08 DIAGNOSIS — Y999 Unspecified external cause status: Secondary | ICD-10-CM | POA: Diagnosis not present

## 2016-08-08 DIAGNOSIS — S60811A Abrasion of right wrist, initial encounter: Secondary | ICD-10-CM | POA: Diagnosis not present

## 2016-08-08 DIAGNOSIS — Y939 Activity, unspecified: Secondary | ICD-10-CM | POA: Diagnosis not present

## 2016-08-08 DIAGNOSIS — T148XXA Other injury of unspecified body region, initial encounter: Secondary | ICD-10-CM

## 2016-08-08 DIAGNOSIS — Z3A20 20 weeks gestation of pregnancy: Secondary | ICD-10-CM | POA: Insufficient documentation

## 2016-08-08 DIAGNOSIS — M25511 Pain in right shoulder: Secondary | ICD-10-CM

## 2016-08-08 DIAGNOSIS — T07XXXA Unspecified multiple injuries, initial encounter: Secondary | ICD-10-CM

## 2016-08-08 LAB — ABO/RH: ABO/RH(D): A POS

## 2016-08-08 NOTE — ED Notes (Signed)
Denny PeonErin, [facility] called stating that patient is 20 weeks and 1 day and only needs to have fetal heart tones doplered. Erin advised this RN that they will not come to see patient unless provider requests them.

## 2016-08-08 NOTE — ED Notes (Signed)
Pt given sandwich, graham crackers, pb & coke

## 2016-08-08 NOTE — ED Notes (Signed)
OB rapid response at bedside 

## 2016-08-08 NOTE — ED Notes (Signed)
PA Ward requesting [facility] come to evaluate patient. This RN contacted Denny PeonErin, RN with [facility] and made her aware of PA's request. Erin requesting more information for why provider is requesting them to come, PA ward made aware and speaking with RN over the phone regarding situation
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
## 2016-08-20 ENCOUNTER — Encounter: Payer: Medicaid Other | Admitting: Obstetrics and Gynecology

## 2016-08-20 ENCOUNTER — Encounter: Payer: Self-pay | Admitting: Obstetrics and Gynecology

## 2016-08-20 ENCOUNTER — Encounter: Payer: Self-pay | Admitting: Family Medicine

## 2016-08-20 NOTE — Progress Notes (Signed)
Patient did not keep OB appointment for 08/20/2016.  Julia Hopkins, Jr MD Attending [facility](Faculty Practice)

## 2016-08-22 ENCOUNTER — Encounter: Payer: Medicaid Other | Admitting: Obstetrics and Gynecology

## 2016-08-22 ENCOUNTER — Encounter: Payer: Self-pay | Admitting: Family Medicine

## 2016-08-28 ENCOUNTER — Encounter: Payer: Medicaid Other | Admitting: Obstetrics and Gynecology

## 2016-08-30 ENCOUNTER — Encounter: Payer: Self-pay | Admitting: Family Medicine

## 2016-09-09 ENCOUNTER — Telehealth: Payer: Self-pay | Admitting: *Deleted

## 2016-09-09 DIAGNOSIS — J45909 Unspecified asthma, uncomplicated: Secondary | ICD-10-CM | POA: Insufficient documentation

## 2016-09-09 DIAGNOSIS — O10919 Unspecified pre-existing hypertension complicating pregnancy, unspecified trimester: Secondary | ICD-10-CM

## 2016-09-09 MED ORDER — FLUTICASONE PROPIONATE HFA 110 MCG/ACT IN AERO: 2.0000 | INHALATION_SPRAY | Freq: Two times a day (BID) | RESPIRATORY_TRACT | 1 refills | Status: DC

## 2016-09-09 NOTE — Telephone Encounter (Signed)
Discussed with Dr. Shawnie PonsPratt and approved flovent and order sent it.

## 2016-09-09 NOTE — Telephone Encounter (Signed)
[facility] and trying to get flovent- asked for a call. I called [facility] and they state since Qvar not covered that most doctors are ordering flovent but she did not know if that could be given in pregnancy.Informed her I would check with provider and if approved will send in order.,

## 2016-09-24 ENCOUNTER — Ambulatory Visit (INDEPENDENT_AMBULATORY_CARE_PROVIDER_SITE_OTHER): Payer: Medicaid Other | Admitting: Obstetrics and Gynecology

## 2016-09-24 VITALS — BP 127/71 | HR 87 | Wt 248.7 lb

## 2016-09-24 DIAGNOSIS — F32A Depression, unspecified: Secondary | ICD-10-CM | POA: Insufficient documentation

## 2016-09-24 DIAGNOSIS — O99332 Smoking (tobacco) complicating pregnancy, second trimester: Secondary | ICD-10-CM

## 2016-09-24 DIAGNOSIS — O10912 Unspecified pre-existing hypertension complicating pregnancy, second trimester: Secondary | ICD-10-CM

## 2016-09-24 DIAGNOSIS — O10919 Unspecified pre-existing hypertension complicating pregnancy, unspecified trimester: Secondary | ICD-10-CM

## 2016-09-24 DIAGNOSIS — O0990 Supervision of high risk pregnancy, unspecified, unspecified trimester: Secondary | ICD-10-CM

## 2016-09-24 DIAGNOSIS — Z8759 Personal history of other complications of pregnancy, childbirth and the puerperium: Secondary | ICD-10-CM

## 2016-09-24 DIAGNOSIS — O0992 Supervision of high risk pregnancy, unspecified, second trimester: Secondary | ICD-10-CM

## 2016-09-24 DIAGNOSIS — F329 Major depressive disorder, single episode, unspecified: Secondary | ICD-10-CM

## 2016-09-24 DIAGNOSIS — O99340 Other mental disorders complicating pregnancy, unspecified trimester: Secondary | ICD-10-CM

## 2016-09-24 DIAGNOSIS — O99342 Other mental disorders complicating pregnancy, second trimester: Secondary | ICD-10-CM

## 2016-09-24 MED ORDER — SERTRALINE HCL 50 MG PO TABS: 50.0000 mg | ORAL_TABLET | Freq: Every day | ORAL | 3 refills | Status: DC
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
## 2016-09-24 NOTE — Progress Notes (Signed)
Scored high on PQ9 but refused to see clinician.

## 2016-10-08 ENCOUNTER — Ambulatory Visit (INDEPENDENT_AMBULATORY_CARE_PROVIDER_SITE_OTHER): Payer: Medicaid Other | Admitting: Advanced Practice Midwife

## 2016-10-08 ENCOUNTER — Ambulatory Visit (HOSPITAL_COMMUNITY)
Admission: RE | Admit: 2016-10-08 | Discharge: 2016-10-08 | Disposition: A | Discharge status: Home / Self Care | Payer: Medicaid Other | Source: Ambulatory Visit | Attending: Obstetrics and Gynecology | Admitting: Obstetrics and Gynecology

## 2016-10-08 VITALS — BP 110/60 | HR 90 | Wt 246.5 lb

## 2016-10-08 DIAGNOSIS — O10913 Unspecified pre-existing hypertension complicating pregnancy, third trimester: Secondary | ICD-10-CM | POA: Insufficient documentation

## 2016-10-08 DIAGNOSIS — Z91410 Personal history of adult physical and sexual abuse: Secondary | ICD-10-CM | POA: Diagnosis not present

## 2016-10-08 DIAGNOSIS — E669 Obesity, unspecified: Secondary | ICD-10-CM | POA: Diagnosis not present

## 2016-10-08 DIAGNOSIS — Z23 Encounter for immunization: Secondary | ICD-10-CM

## 2016-10-08 DIAGNOSIS — M25511 Pain in right shoulder: Secondary | ICD-10-CM | POA: Diagnosis present

## 2016-10-08 DIAGNOSIS — O0993 Supervision of high risk pregnancy, unspecified, third trimester: Secondary | ICD-10-CM

## 2016-10-08 DIAGNOSIS — R2990 Unspecified symptoms and signs involving the nervous system: Secondary | ICD-10-CM

## 2016-10-08 DIAGNOSIS — Z362 Encounter for other antenatal screening follow-up: Secondary | ICD-10-CM | POA: Insufficient documentation

## 2016-10-08 DIAGNOSIS — G8929 Other chronic pain: Secondary | ICD-10-CM

## 2016-10-08 DIAGNOSIS — O99213 Obesity complicating pregnancy, third trimester: Secondary | ICD-10-CM | POA: Insufficient documentation

## 2016-10-08 DIAGNOSIS — Z3A28 28 weeks gestation of pregnancy: Secondary | ICD-10-CM | POA: Diagnosis not present

## 2016-10-08 DIAGNOSIS — O0990 Supervision of high risk pregnancy, unspecified, unspecified trimester: Secondary | ICD-10-CM

## 2016-10-08 DIAGNOSIS — O09893 Supervision of other high risk pregnancies, third trimester: Secondary | ICD-10-CM | POA: Diagnosis present

## 2016-10-08 DIAGNOSIS — O10919 Unspecified pre-existing hypertension complicating pregnancy, unspecified trimester: Secondary | ICD-10-CM

## 2016-10-08 MED ORDER — CYCLOBENZAPRINE HCL 10 MG PO TABS: 5.0000 mg | ORAL_TABLET | Freq: Three times a day (TID) | ORAL | 1 refills | Status: DC | PRN
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
## 2016-10-09 LAB — GLUCOSE TOLERANCE, 1 HOUR: Glucose, 1Hr PP: 136 mg/dL (ref 65–199)

## 2016-10-09 LAB — CBC
Hematocrit: 33.1 % — ABNORMAL LOW (ref 34.0–46.6)
Hemoglobin: 10.8 g/dL — ABNORMAL LOW (ref 11.1–15.9)
MCH: 27.9 pg (ref 26.6–33.0)
MCHC: 32.6 g/dL (ref 31.5–35.7)
MCV: 86 fL (ref 79–97)
Platelets: 344 10*3/uL (ref 150–379)
RBC: 3.87 x10E6/uL (ref 3.77–5.28)
RDW: 14.3 % (ref 12.3–15.4)
WBC: 9.4 10*3/uL (ref 3.4–10.8)

## 2016-10-09 LAB — RPR: RPR Ser Ql: NONREACTIVE

## 2016-10-09 LAB — HIV ANTIBODY (ROUTINE TESTING W REFLEX): HIV Screen 4th Generation wRfx: NONREACTIVE

## 2016-10-10 ENCOUNTER — Other Ambulatory Visit (HOSPITAL_COMMUNITY): Payer: Self-pay | Admitting: *Deleted

## 2016-10-10 DIAGNOSIS — O359XX0 Maternal care for (suspected) fetal abnormality and damage, unspecified, not applicable or unspecified: Secondary | ICD-10-CM

## 2016-10-11 ENCOUNTER — Telehealth: Payer: Self-pay

## 2016-10-11 NOTE — Telephone Encounter (Signed)
-----   Message from [place], [place] sent at 10/11/2016 12:20 AM EDT ----- Failed 1 hour GTT. Needs 3 hour GTT. May ask to test blood sugars instead.

## 2016-10-11 NOTE — Telephone Encounter (Signed)
Called patient inform her of failed 1 hour and need to do a 3 hour. Advised patient to start checking blood sugars and to limit her intake on carbs, sweets and soda. Patient reports understanding at this time. A message has been sent to the admin to schedule patient for appointment.

## 2016-10-14 ENCOUNTER — Telehealth: Payer: Self-pay | Admitting: General Practice

## 2016-10-14 NOTE — Telephone Encounter (Signed)
Called patient and left message on VM for patient to give our office a call to schedule 3hr gtts.

## 2016-10-22 ENCOUNTER — Encounter: Payer: Self-pay | Admitting: Family Medicine

## 2016-10-22 ENCOUNTER — Ambulatory Visit (INDEPENDENT_AMBULATORY_CARE_PROVIDER_SITE_OTHER): Payer: Medicaid Other | Admitting: Family Medicine

## 2016-10-22 VITALS — BP 127/71 | HR 89 | Wt 249.0 lb

## 2016-10-22 DIAGNOSIS — O99213 Obesity complicating pregnancy, third trimester: Secondary | ICD-10-CM

## 2016-10-22 DIAGNOSIS — O10913 Unspecified pre-existing hypertension complicating pregnancy, third trimester: Secondary | ICD-10-CM

## 2016-10-22 DIAGNOSIS — G8929 Other chronic pain: Secondary | ICD-10-CM

## 2016-10-22 DIAGNOSIS — M25511 Pain in right shoulder: Secondary | ICD-10-CM

## 2016-10-22 DIAGNOSIS — Z8759 Personal history of other complications of pregnancy, childbirth and the puerperium: Secondary | ICD-10-CM

## 2016-10-22 DIAGNOSIS — J45909 Unspecified asthma, uncomplicated: Secondary | ICD-10-CM

## 2016-10-22 DIAGNOSIS — E669 Obesity, unspecified: Secondary | ICD-10-CM

## 2016-10-22 DIAGNOSIS — O10919 Unspecified pre-existing hypertension complicating pregnancy, unspecified trimester: Secondary | ICD-10-CM

## 2016-10-22 DIAGNOSIS — O0993 Supervision of high risk pregnancy, unspecified, third trimester: Secondary | ICD-10-CM

## 2016-10-22 DIAGNOSIS — O0990 Supervision of high risk pregnancy, unspecified, unspecified trimester: Secondary | ICD-10-CM

## 2016-10-22 LAB — POCT URINALYSIS DIP (DEVICE)
Bilirubin Urine: NEGATIVE
Glucose, UA: NEGATIVE mg/dL
Hgb urine dipstick: NEGATIVE
Ketones, ur: NEGATIVE mg/dL
Nitrite: NEGATIVE
Protein, ur: NEGATIVE mg/dL
Specific Gravity, Urine: >=1.03 (ref 1.005–1.030)
Urobilinogen, UA: 0.2 mg/dL (ref 0.0–1.0)
pH: 6 (ref 5.0–8.0)

## 2016-10-22 MED ORDER — SERTRALINE HCL 50 MG PO TABS: 100.0000 mg | ORAL_TABLET | Freq: Every day | ORAL | 3 refills | Status: DC

## 2016-10-22 MED ORDER — CYCLOBENZAPRINE HCL 10 MG PO TABS: 10.0000 mg | ORAL_TABLET | Freq: Three times a day (TID) | ORAL | 3 refills | Status: DC | PRN
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
## 2016-10-24 ENCOUNTER — Other Ambulatory Visit: Payer: Medicaid Other

## 2016-10-24 DIAGNOSIS — O0992 Supervision of high risk pregnancy, unspecified, second trimester: Secondary | ICD-10-CM

## 2016-10-25 LAB — GLUCOSE TOLERANCE, 2 HOURS W/ 1HR
Glucose, 1 hour: 90 mg/dL (ref 65–179)
Glucose, 2 hour: 91 mg/dL (ref 65–152)
Glucose, Fasting: 89 mg/dL (ref 65–91)

## 2016-10-30 ENCOUNTER — Ambulatory Visit: Payer: Self-pay

## 2016-10-30 ENCOUNTER — Ambulatory Visit (INDEPENDENT_AMBULATORY_CARE_PROVIDER_SITE_OTHER): Payer: Medicaid Other | Admitting: *Deleted

## 2016-10-30 ENCOUNTER — Ambulatory Visit (INDEPENDENT_AMBULATORY_CARE_PROVIDER_SITE_OTHER): Payer: Medicaid Other | Admitting: Obstetrics & Gynecology

## 2016-10-30 VITALS — BP 132/79 | Wt 250.3 lb

## 2016-10-30 DIAGNOSIS — R2990 Unspecified symptoms and signs involving the nervous system: Secondary | ICD-10-CM

## 2016-10-30 DIAGNOSIS — O10913 Unspecified pre-existing hypertension complicating pregnancy, third trimester: Secondary | ICD-10-CM

## 2016-10-30 DIAGNOSIS — O10919 Unspecified pre-existing hypertension complicating pregnancy, unspecified trimester: Secondary | ICD-10-CM

## 2016-10-30 DIAGNOSIS — O99333 Smoking (tobacco) complicating pregnancy, third trimester: Secondary | ICD-10-CM

## 2016-10-30 DIAGNOSIS — O0990 Supervision of high risk pregnancy, unspecified, unspecified trimester: Secondary | ICD-10-CM

## 2016-10-30 DIAGNOSIS — O99213 Obesity complicating pregnancy, third trimester: Secondary | ICD-10-CM

## 2016-10-30 DIAGNOSIS — O99332 Smoking (tobacco) complicating pregnancy, second trimester: Secondary | ICD-10-CM

## 2016-10-30 DIAGNOSIS — O0993 Supervision of high risk pregnancy, unspecified, third trimester: Secondary | ICD-10-CM

## 2016-10-30 LAB — POCT URINALYSIS DIP (DEVICE)
Bilirubin Urine: NEGATIVE
Glucose, UA: NEGATIVE mg/dL
Hgb urine dipstick: NEGATIVE
Ketones, ur: NEGATIVE mg/dL
Leukocytes, UA: NEGATIVE
Nitrite: NEGATIVE
Protein, ur: NEGATIVE mg/dL
Specific Gravity, Urine: 1.015 (ref 1.005–1.030)
Urobilinogen, UA: 0.2 mg/dL (ref 0.0–1.0)
pH: 7 (ref 5.0–8.0)

## 2016-10-30 NOTE — Progress Notes (Signed)
Pt beginning weekly NST/BPP. US for growth scheduled on 7/31, BPP added.
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
## 2016-10-30 NOTE — Progress Notes (Signed)

## 2016-11-01 ENCOUNTER — Other Ambulatory Visit: Payer: Medicaid Other

## 2016-11-05 ENCOUNTER — Ambulatory Visit (HOSPITAL_COMMUNITY)
Admission: RE | Admit: 2016-11-05 | Discharge: 2016-11-05 | Disposition: A | Discharge status: Home / Self Care | Payer: Medicaid Other | Source: Ambulatory Visit | Attending: Obstetrics and Gynecology | Admitting: Obstetrics and Gynecology

## 2016-11-05 ENCOUNTER — Other Ambulatory Visit: Payer: Medicaid Other | Admitting: Student

## 2016-11-05 ENCOUNTER — Ambulatory Visit (INDEPENDENT_AMBULATORY_CARE_PROVIDER_SITE_OTHER): Payer: Medicaid Other | Admitting: Student

## 2016-11-05 ENCOUNTER — Encounter: Payer: Self-pay | Admitting: Family Medicine

## 2016-11-05 ENCOUNTER — Ambulatory Visit (INDEPENDENT_AMBULATORY_CARE_PROVIDER_SITE_OTHER): Payer: Medicaid Other | Admitting: *Deleted

## 2016-11-05 ENCOUNTER — Other Ambulatory Visit: Payer: Medicaid Other | Admitting: Obstetrics and Gynecology

## 2016-11-05 VITALS — BP 118/62 | HR 83 | Wt 254.0 lb

## 2016-11-05 DIAGNOSIS — O10913 Unspecified pre-existing hypertension complicating pregnancy, third trimester: Secondary | ICD-10-CM

## 2016-11-05 DIAGNOSIS — O10919 Unspecified pre-existing hypertension complicating pregnancy, unspecified trimester: Secondary | ICD-10-CM

## 2016-11-05 DIAGNOSIS — O0990 Supervision of high risk pregnancy, unspecified, unspecified trimester: Secondary | ICD-10-CM

## 2016-11-05 DIAGNOSIS — O0993 Supervision of high risk pregnancy, unspecified, third trimester: Secondary | ICD-10-CM

## 2016-11-05 LAB — POCT URINALYSIS DIP (DEVICE)
Bilirubin Urine: NEGATIVE
Glucose, UA: NEGATIVE mg/dL
Hgb urine dipstick: NEGATIVE
Ketones, ur: NEGATIVE mg/dL
Leukocytes, UA: NEGATIVE
Nitrite: NEGATIVE
Protein, ur: 30 mg/dL — AB
Specific Gravity, Urine: 1.02 (ref 1.005–1.030)
Urobilinogen, UA: 0.2 mg/dL (ref 0.0–1.0)
pH: 7 (ref 5.0–8.0)

## 2016-11-05 NOTE — Progress Notes (Signed)
Pt reports rectal pain while urinating and during BM's. US for growth and BPP scheduled 8/2.    [facility] She declined.

## 2016-11-05 NOTE — Patient Instructions (Signed)
Hypertension During Pregnancy °Hypertension, commonly called high blood pressure, is when the force of blood pumping through your arteries is too strong. Arteries are blood vessels that carry blood from the heart throughout the body. Hypertension during pregnancy can cause problems for you and your baby. Your baby may be born early (prematurely) or may not weigh as much as he or she should at birth. Very bad cases of hypertension during pregnancy can be life-threatening. °Different types of hypertension can occur during pregnancy. These include: °· Chronic hypertension. This happens when: °? You have hypertension before pregnancy and it continues during pregnancy. °? You develop hypertension before you are [redacted] weeks pregnant, and it continues during pregnancy. °· Gestational hypertension. This is hypertension that develops after the 20th week of pregnancy. °· Preeclampsia, also called toxemia of pregnancy. This is a very serious type of hypertension that develops only during pregnancy. It affects the whole body, and it can be very dangerous for you and your baby. ° °Gestational hypertension and preeclampsia usually go away within 6 weeks after your baby is born. Women who have hypertension during pregnancy have a greater chance of developing hypertension later in life or during future pregnancies. °What are the causes? °The exact cause of hypertension is not known. °What increases the risk? °There are certain factors that make it more likely for you to develop hypertension during pregnancy. These include: °· Having hypertension during a previous pregnancy or prior to pregnancy. °· Being overweight. °· Being older than age 40. °· Being pregnant for the first time or being pregnant with more than one baby. °· Becoming pregnant using fertilization methods such as IVF (in vitro fertilization). °· Having diabetes, kidney problems, or systemic lupus erythematosus. °· Having a family history of hypertension. ° °What are the  signs or symptoms? °Chronic hypertension and gestational hypertension rarely cause symptoms. Preeclampsia causes symptoms, which may include: °· Increased protein in your urine. Your health care provider will check for this at every visit before you give birth (prenatal visit). °· Severe headaches. °· Sudden weight gain. °· Swelling of the hands, face, legs, and feet. °· Nausea and vomiting. °· Vision problems, such as blurred or double vision. °· Numbness in the face, arms, legs, and feet. °· Dizziness. °· Slurred speech. °· Sensitivity to bright lights. °· Abdominal pain. °· Convulsions. ° °How is this diagnosed? °You may be diagnosed with hypertension during a routine prenatal exam. At each prenatal visit, you may: °· Have a urine test to check for high amounts of protein in your urine. °· Have your blood pressure checked. A blood pressure reading is recorded as two numbers, such as "120 over 80" (or 120/80). The first ("top") number is called the systolic pressure. It is a measure of the pressure in your arteries when your heart beats. The second ("bottom") number is called the diastolic pressure. It is a measure of the pressure in your arteries as your heart relaxes between beats. Blood pressure is measured in a unit called mm Hg. A normal blood pressure reading is: °? Systolic: below 120. °? Diastolic: below 80. ° °The type of hypertension that you are diagnosed with depends on your test results and when your symptoms developed. °· Chronic hypertension is usually diagnosed before 20 weeks of pregnancy. °· Gestational hypertension is usually diagnosed after 20 weeks of pregnancy. °· Hypertension with high amounts of protein in the urine is diagnosed as preeclampsia. °· Blood pressure measurements that stay above 160 systolic, or above 110 diastolic, are   signs of severe preeclampsia. ° °How is this treated? °Treatment for hypertension during pregnancy varies depending on the type of hypertension you have and how  serious it is. °· If you take medicines called ACE inhibitors to treat chronic hypertension, you may need to switch medicines. ACE inhibitors should not be taken during pregnancy. °· If you have gestational hypertension, you may need to take blood pressure medicine. °· If you are at risk for preeclampsia, your health care provider may recommend that you take a low-dose aspirin every day to prevent high blood pressure during your pregnancy. °· If you have severe preeclampsia, you may need to be hospitalized so you and your baby can be monitored closely. You may also need to take medicine (magnesium sulfate) to prevent seizures and to lower blood pressure. This medicine may be given as an injection or through an IV tube. °· In some cases, if your condition gets worse, you may need to deliver your baby early. ° °Follow these instructions at home: °Eating and drinking °· Drink enough fluid to keep your urine clear or pale yellow. °· Eat a healthy diet that is low in salt (sodium). Do not add salt to your food. Check food labels to see how much sodium a food or beverage contains. °Lifestyle °· Do not use any products that contain nicotine or tobacco, such as cigarettes and e-cigarettes. If you need help quitting, ask your health care provider. °· Do not use alcohol. °· Avoid caffeine. °· Avoid stress as much as possible. Rest and get plenty of sleep. °General instructions °· Take over-the-counter and prescription medicines only as told by your health care provider. °· While lying down, lie on your left side. This keeps pressure off your baby. °· While sitting or lying down, raise (elevate) your feet. Try putting some pillows under your lower legs. °· Exercise regularly. Ask your health care provider what kinds of exercise are best for you. °· Keep all prenatal and follow-up visits as told by your health care provider. This is important. °Contact a health care provider if: °· You have symptoms that your health care  provider told you may require more treatment or monitoring, such as: °? Fever. °? Vomiting. °? Headache. °Get help right away if: °· You have severe abdominal pain or vomiting that does not get better with treatment. °· You suddenly develop swelling in your hands, ankles, or face. °· You gain 4 lbs (1.8 kg) or more in 1 week. °· You develop vaginal bleeding, or you have blood in your urine. °· You do not feel your baby moving as much as usual. °· You have blurred or double vision. °· You have muscle twitching or sudden tightening (spasms). °· You have shortness of breath. °· Your lips or fingernails turn blue. °This information is not intended to replace advice given to you by your health care provider. Make sure you discuss any questions you have with your health care provider. °Document Released: 12/11/2010 Document Revised: 10/13/2015 Document Reviewed: 09/08/2015 °Elsevier Interactive Patient Education © 2018 [employer] ° °
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
## 2016-11-07 ENCOUNTER — Ambulatory Visit (HOSPITAL_COMMUNITY)
Admission: RE | Admit: 2016-11-07 | Discharge: 2016-11-07 | Disposition: A | Discharge status: Home / Self Care | Payer: Medicaid Other | Source: Ambulatory Visit | Attending: Obstetrics and Gynecology | Admitting: Obstetrics and Gynecology

## 2016-11-12 ENCOUNTER — Ambulatory Visit (INDEPENDENT_AMBULATORY_CARE_PROVIDER_SITE_OTHER): Payer: Medicaid Other | Admitting: Obstetrics & Gynecology

## 2016-11-12 ENCOUNTER — Other Ambulatory Visit: Payer: Medicaid Other | Admitting: Obstetrics & Gynecology

## 2016-11-12 ENCOUNTER — Other Ambulatory Visit (HOSPITAL_COMMUNITY): Payer: Self-pay | Admitting: *Deleted

## 2016-11-12 ENCOUNTER — Encounter (HOSPITAL_COMMUNITY): Payer: Self-pay

## 2016-11-12 ENCOUNTER — Ambulatory Visit (INDEPENDENT_AMBULATORY_CARE_PROVIDER_SITE_OTHER): Payer: Medicaid Other | Admitting: *Deleted

## 2016-11-12 ENCOUNTER — Encounter: Payer: Self-pay | Admitting: *Deleted

## 2016-11-12 ENCOUNTER — Ambulatory Visit (HOSPITAL_COMMUNITY)
Admission: RE | Admit: 2016-11-12 | Discharge: 2016-11-12 | Disposition: A | Discharge status: Home / Self Care | Payer: Medicaid Other | Source: Ambulatory Visit | Attending: Obstetrics and Gynecology | Admitting: Obstetrics and Gynecology

## 2016-11-12 ENCOUNTER — Ambulatory Visit (HOSPITAL_COMMUNITY)
Admission: RE | Admit: 2016-11-12 | Discharge: 2016-11-12 | Disposition: A | Discharge status: Home / Self Care | Payer: Medicaid Other | Source: Ambulatory Visit | Attending: Maternal and Fetal Medicine | Admitting: Maternal and Fetal Medicine

## 2016-11-12 VITALS — BP 117/65 | HR 82 | Wt 252.0 lb

## 2016-11-12 DIAGNOSIS — O281 Abnormal biochemical finding on antenatal screening of mother: Secondary | ICD-10-CM | POA: Insufficient documentation

## 2016-11-12 DIAGNOSIS — O358XX0 Maternal care for other (suspected) fetal abnormality and damage, not applicable or unspecified: Secondary | ICD-10-CM | POA: Diagnosis not present

## 2016-11-12 DIAGNOSIS — Z6841 Body Mass Index (BMI) 40.0 and over, adult: Secondary | ICD-10-CM | POA: Diagnosis not present

## 2016-11-12 DIAGNOSIS — O10913 Unspecified pre-existing hypertension complicating pregnancy, third trimester: Secondary | ICD-10-CM | POA: Diagnosis not present

## 2016-11-12 DIAGNOSIS — O99213 Obesity complicating pregnancy, third trimester: Secondary | ICD-10-CM | POA: Insufficient documentation

## 2016-11-12 DIAGNOSIS — O0993 Supervision of high risk pregnancy, unspecified, third trimester: Secondary | ICD-10-CM

## 2016-11-12 DIAGNOSIS — Q564 Indeterminate sex, unspecified: Secondary | ICD-10-CM

## 2016-11-12 DIAGNOSIS — E669 Obesity, unspecified: Secondary | ICD-10-CM | POA: Diagnosis not present

## 2016-11-12 DIAGNOSIS — O10919 Unspecified pre-existing hypertension complicating pregnancy, unspecified trimester: Secondary | ICD-10-CM

## 2016-11-12 DIAGNOSIS — O359XX0 Maternal care for (suspected) fetal abnormality and damage, unspecified, not applicable or unspecified: Secondary | ICD-10-CM

## 2016-11-12 DIAGNOSIS — Z3A33 33 weeks gestation of pregnancy: Secondary | ICD-10-CM | POA: Diagnosis not present

## 2016-11-12 DIAGNOSIS — O0990 Supervision of high risk pregnancy, unspecified, unspecified trimester: Secondary | ICD-10-CM

## 2016-11-12 NOTE — Patient Instructions (Signed)
Third Trimester of Pregnancy The third trimester is from week 28 through week 40 (months 7 through 9). The third trimester is a time when the unborn baby (fetus) is growing rapidly. At the end of the ninth month, the fetus is about 20 inches in length and weighs 6-10 pounds. Body changes during your third trimester Your body will continue to go through many changes during pregnancy. The changes vary from woman to woman. During the third trimester:  Your weight will continue to increase. You can expect to gain 25-35 pounds (11-16 kg) by the end of the pregnancy.  You may begin to get stretch marks on your hips, abdomen, and breasts.  You may urinate more often because the fetus is moving lower into your pelvis and pressing on your bladder.  You may develop or continue to have heartburn. This is caused by increased hormones that slow down muscles in the digestive tract.  You may develop or continue to have constipation because increased hormones slow digestion and cause the muscles that push waste through your intestines to relax.  You may develop hemorrhoids. These are swollen veins (varicose veins) in the rectum that can itch or be painful.  You may develop swollen, bulging veins (varicose veins) in your legs.  You may have increased body aches in the pelvis, back, or thighs. This is due to weight gain and increased hormones that are relaxing your joints.  You may have changes in your hair. These can include thickening of your hair, rapid growth, and changes in texture. Some women also have hair loss during or after pregnancy, or hair that feels dry or thin. Your hair will most likely return to normal after your baby is born.  Your breasts will continue to grow and they will continue to become tender. A yellow fluid (colostrum) may leak from your breasts. This is the first milk you are producing for your baby.  Your belly button may stick out.  You may notice more swelling in your hands,  face, or ankles.  You may have increased tingling or numbness in your hands, arms, and legs. The skin on your belly may also feel numb.  You may feel short of breath because of your expanding uterus.  You may have more problems sleeping. This can be caused by the size of your belly, increased need to urinate, and an increase in your body's metabolism.  You may notice the fetus "dropping," or moving lower in your abdomen (lightening).  You may have increased vaginal discharge.  You may notice your joints feel loose and you may have pain around your pelvic bone.  What to expect at prenatal visits You will have prenatal exams every 2 weeks until week 36. Then you will have weekly prenatal exams. During a routine prenatal visit:  You will be weighed to make sure you and the baby are growing normally.  Your blood pressure will be taken.  Your abdomen will be measured to track your baby's growth.  The fetal heartbeat will be listened to.  Any test results from the previous visit will be discussed.  You may have a cervical check near your due date to see if your cervix has softened or thinned (effaced).  You will be tested for Group B streptococcus. This happens between 35 and 37 weeks.  Your health care provider may ask you:  What your birth plan is.  How you are feeling.  If you are feeling the baby move.  If you have had   any abnormal symptoms, such as leaking fluid, bleeding, severe headaches, or abdominal cramping.  If you are using any tobacco products, including cigarettes, chewing tobacco, and electronic cigarettes.  If you have any questions.  Other tests or screenings that may be performed during your third trimester include:  Blood tests that check for low iron levels (anemia).  Fetal testing to check the health, activity level, and growth of the fetus. Testing is done if you have certain medical conditions or if there are problems during the  pregnancy.  Nonstress test (NST). This test checks the health of your baby to make sure there are no signs of problems, such as the baby not getting enough oxygen. During this test, a belt is placed around your belly. The baby is made to move, and its heart rate is monitored during movement.  What is false labor? False labor is a condition in which you feel small, irregular tightenings of the muscles in the womb (contractions) that usually go away with rest, changing position, or drinking water. These are called Braxton Hicks contractions. Contractions may last for hours, days, or even weeks before true labor sets in. If contractions come at regular intervals, become more frequent, increase in intensity, or become painful, you should see your health care provider. What are the signs of labor?  Abdominal cramps.  Regular contractions that start at 10 minutes apart and become stronger and more frequent with time.  Contractions that start on the top of the uterus and spread down to the lower abdomen and back.  Increased pelvic pressure and dull back pain.  A watery or bloody mucus discharge that comes from the vagina.  Leaking of amniotic fluid. This is also known as your "water breaking." It could be a slow trickle or a gush. Let your health care provider know if it has a color or strange odor. If you have any of these signs, call your health care provider right away, even if it is before your due date. Follow these instructions at home: Medicines  Follow your health care provider's instructions regarding medicine use. Specific medicines may be either safe or unsafe to take during pregnancy.  Take a prenatal vitamin that contains at least 600 micrograms (mcg) of folic acid.  If you develop constipation, try taking a stool softener if your health care provider approves. Eating and drinking  Eat a balanced diet that includes fresh fruits and vegetables, whole grains, good sources of protein  such as meat, eggs, or tofu, and low-fat dairy. Your health care provider will help you determine the amount of weight gain that is right for you.  Avoid raw meat and uncooked cheese. These carry germs that can cause birth defects in the baby.  If you have low calcium intake from food, talk to your health care provider about whether you should take a daily calcium supplement.  Eat four or five small meals rather than three large meals a day.  Limit foods that are high in fat and processed sugars, such as fried and sweet foods.  To prevent constipation: ? Drink enough fluid to keep your urine clear or pale yellow. ? Eat foods that are high in fiber, such as fresh fruits and vegetables, whole grains, and beans. Activity  Exercise only as directed by your health care provider. Most women can continue their usual exercise routine during pregnancy. Try to exercise for 30 minutes at least 5 days a week. Stop exercising if you experience uterine contractions.  Avoid heavy   lifting.  Do not exercise in extreme heat or humidity, or at high altitudes.  Wear low-heel, comfortable shoes.  Practice good posture.  You may continue to have sex unless your health care provider tells you otherwise. Relieving pain and discomfort  Take frequent breaks and rest with your legs elevated if you have leg cramps or low back pain.  Take warm sitz baths to soothe any pain or discomfort caused by hemorrhoids. Use hemorrhoid cream if your health care provider approves.  Wear a good support bra to prevent discomfort from breast tenderness.  If you develop varicose veins: ? Wear support pantyhose or compression stockings as told by your healthcare provider. ? Elevate your feet for 15 minutes, 3-4 times a day. Prenatal care  Write down your questions. Take them to your prenatal visits.  Keep all your prenatal visits as told by your health care provider. This is important. Safety  Wear your seat belt at  all times when driving.  Make a list of emergency phone numbers, including numbers for family, friends, the hospital, and police and fire departments. General instructions  Avoid cat litter boxes and soil used by cats. These carry germs that can cause birth defects in the baby. If you have a cat, ask someone to clean the litter box for you.  Do not travel far distances unless it is absolutely necessary and only with the approval of your health care provider.  Do not use hot tubs, steam rooms, or saunas.  Do not drink alcohol.  Do not use any products that contain nicotine or tobacco, such as cigarettes and e-cigarettes. If you need help quitting, ask your health care provider.  Do not use any medicinal herbs or unprescribed drugs. These chemicals affect the formation and growth of the baby.  Do not douche or use tampons or scented sanitary pads.  Do not cross your legs for long periods of time.  To prepare for the arrival of your baby: ? Take prenatal classes to understand, practice, and ask questions about labor and delivery. ? Make a trial run to the hospital. ? Visit the hospital and tour the maternity area. ? Arrange for maternity or paternity leave through employers. ? Arrange for family and friends to take care of pets while you are in the hospital. ? Purchase a rear-facing car seat and make sure you know how to install it in your car. ? Pack your hospital bag. ? Prepare the baby's nursery. Make sure to remove all pillows and stuffed animals from the baby's crib to prevent suffocation.  Visit your dentist if you have not gone during your pregnancy. Use a soft toothbrush to brush your teeth and be gentle when you floss. Contact a health care provider if:  You are unsure if you are in labor or if your water has broken.  You become dizzy.  You have mild pelvic cramps, pelvic pressure, or nagging pain in your abdominal area.  You have lower back pain.  You have persistent  nausea, vomiting, or diarrhea.  You have an unusual or bad smelling vaginal discharge.  You have pain when you urinate. Get help right away if:  Your water breaks before 37 weeks.  You have regular contractions less than 5 minutes apart before 37 weeks.  You have a fever.  You are leaking fluid from your vagina.  You have spotting or bleeding from your vagina.  You have severe abdominal pain or cramping.  You have rapid weight loss or weight gain.    You have shortness of breath with chest pain.  You notice sudden or extreme swelling of your face, hands, ankles, feet, or legs.  Your baby makes fewer than 10 movements in 2 hours.  You have severe headaches that do not go away when you take medicine.  You have vision changes. Summary  The third trimester is from week 28 through week 40, months 7 through 9. The third trimester is a time when the unborn baby (fetus) is growing rapidly.  During the third trimester, your discomfort may increase as you and your baby continue to gain weight. You may have abdominal, leg, and back pain, sleeping problems, and an increased need to urinate.  During the third trimester your breasts will keep growing and they will continue to become tender. A yellow fluid (colostrum) may leak from your breasts. This is the first milk you are producing for your baby.  False labor is a condition in which you feel small, irregular tightenings of the muscles in the womb (contractions) that eventually go away. These are called Braxton Hicks contractions. Contractions may last for hours, days, or even weeks before true labor sets in.  Signs of labor can include: abdominal cramps; regular contractions that start at 10 minutes apart and become stronger and more frequent with time; watery or bloody mucus discharge that comes from the vagina; increased pelvic pressure and dull back pain; and leaking of amniotic fluid. This information is not intended to replace advice  given to you by your health care provider. Make sure you discuss any questions you have with your health care provider. Document Released: 03/19/2001 Document Revised: 08/31/2015 Document Reviewed: 05/26/2012 Elsevier Interactive Patient Education  2017 [employer]  
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
## 2016-11-12 NOTE — Progress Notes (Signed)
Pt reports becoming shaky after taking Flexeril. US for growth and BPP done today. Pt declined offer to speak with Behavioral Health Counselor today.

## 2016-11-17 ENCOUNTER — Other Ambulatory Visit: Payer: Self-pay

## 2016-11-21 ENCOUNTER — Ambulatory Visit (INDEPENDENT_AMBULATORY_CARE_PROVIDER_SITE_OTHER): Payer: Medicaid Other | Admitting: *Deleted

## 2016-11-21 ENCOUNTER — Ambulatory Visit: Payer: Self-pay

## 2016-11-21 ENCOUNTER — Encounter: Payer: Self-pay | Admitting: *Deleted

## 2016-11-21 ENCOUNTER — Ambulatory Visit (INDEPENDENT_AMBULATORY_CARE_PROVIDER_SITE_OTHER): Payer: Medicaid Other | Admitting: Family Medicine

## 2016-11-21 VITALS — BP 134/78 | HR 101 | Wt 251.8 lb

## 2016-11-21 DIAGNOSIS — O10919 Unspecified pre-existing hypertension complicating pregnancy, unspecified trimester: Secondary | ICD-10-CM

## 2016-11-21 DIAGNOSIS — O99213 Obesity complicating pregnancy, third trimester: Secondary | ICD-10-CM

## 2016-11-21 DIAGNOSIS — O10913 Unspecified pre-existing hypertension complicating pregnancy, third trimester: Secondary | ICD-10-CM

## 2016-11-21 DIAGNOSIS — O0990 Supervision of high risk pregnancy, unspecified, unspecified trimester: Secondary | ICD-10-CM

## 2016-11-21 DIAGNOSIS — O403XX0 Polyhydramnios, third trimester, not applicable or unspecified: Secondary | ICD-10-CM

## 2016-11-21 DIAGNOSIS — O0993 Supervision of high risk pregnancy, unspecified, third trimester: Secondary | ICD-10-CM

## 2016-11-21 DIAGNOSIS — Z8759 Personal history of other complications of pregnancy, childbirth and the puerperium: Secondary | ICD-10-CM

## 2016-11-21 NOTE — Progress Notes (Signed)
US for growth and BPP scheduled on 8/23.

## 2016-11-21 NOTE — Progress Notes (Signed)
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
## 2016-11-25 ENCOUNTER — Telehealth (HOSPITAL_COMMUNITY): Payer: Self-pay | Admitting: MS"

## 2016-11-25 NOTE — Telephone Encounter (Signed)
Attempted to contact patient regarding results of NIPS (Panorama), which are within normal limits. Left message for patient to return call.   Julia Hopkins 11/25/2016 4:27 PM

## 2016-11-27 ENCOUNTER — Telehealth (HOSPITAL_COMMUNITY): Payer: Self-pay | Admitting: Genetics

## 2016-11-27 NOTE — Telephone Encounter (Signed)
Called Julia Hopkins to discuss her prenatal cell free DNA test results.  Mrs. Julia Hopkins had Panorama screening through [facility].  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   The patient was identified by name and DOB.  We reviewed that these are within normal limits, showing a less than 1 in 10,000 risk for trisomies 21, 18 and 13, and monosomy X (Turner syndrome).  In addition, the risk for triploidy and sex chromosome trisomies (47,XXX and 47,XXY) was also low risk.   We reviewed that this testing identifies > 99% of pregnancies with trisomy 60, trisomy 70, sex chromosome trisomies (47,XXX and 47,XXY), and triploidy. The detection rate for trisomy 18 is 96%.  The detection rate for monosomy X is ~92%.  The false positive rate is <0.1% for all conditions. Testing was also consistent with female fetal sex.  The patient did wish to know fetal sex as ultrasound was suggestive of a female fetus.  She understands that this testing does not identify all genetic conditions.  All questions were answered to her satisfaction, she was encouraged to call with additional questions or concerns.  Mady Gemma, MS Certified Genetic Counselor

## 2016-11-28 ENCOUNTER — Ambulatory Visit (INDEPENDENT_AMBULATORY_CARE_PROVIDER_SITE_OTHER): Payer: Medicaid Other | Admitting: Obstetrics & Gynecology

## 2016-11-28 ENCOUNTER — Encounter (HOSPITAL_COMMUNITY): Payer: Self-pay

## 2016-11-28 ENCOUNTER — Other Ambulatory Visit (HOSPITAL_COMMUNITY)
Admission: RE | Admit: 2016-11-28 | Discharge: 2016-11-28 | Disposition: A | Discharge status: Home / Self Care | Payer: Medicaid Other | Source: Ambulatory Visit | Attending: Obstetrics & Gynecology | Admitting: Obstetrics & Gynecology

## 2016-11-28 ENCOUNTER — Ambulatory Visit (HOSPITAL_COMMUNITY)
Admission: RE | Admit: 2016-11-28 | Discharge: 2016-11-28 | Disposition: A | Discharge status: Home / Self Care | Payer: Medicaid Other | Source: Ambulatory Visit | Attending: Obstetrics and Gynecology | Admitting: Obstetrics and Gynecology

## 2016-11-28 ENCOUNTER — Ambulatory Visit (INDEPENDENT_AMBULATORY_CARE_PROVIDER_SITE_OTHER): Payer: Medicaid Other | Admitting: *Deleted

## 2016-11-28 VITALS — BP 132/85 | HR 118 | Wt 251.7 lb

## 2016-11-28 DIAGNOSIS — F329 Major depressive disorder, single episode, unspecified: Secondary | ICD-10-CM

## 2016-11-28 DIAGNOSIS — J4520 Mild intermittent asthma, uncomplicated: Secondary | ICD-10-CM | POA: Insufficient documentation

## 2016-11-28 DIAGNOSIS — F32A Depression, unspecified: Secondary | ICD-10-CM

## 2016-11-28 DIAGNOSIS — O10013 Pre-existing essential hypertension complicating pregnancy, third trimester: Secondary | ICD-10-CM | POA: Insufficient documentation

## 2016-11-28 DIAGNOSIS — Z3A36 36 weeks gestation of pregnancy: Secondary | ICD-10-CM | POA: Diagnosis not present

## 2016-11-28 DIAGNOSIS — O0990 Supervision of high risk pregnancy, unspecified, unspecified trimester: Secondary | ICD-10-CM

## 2016-11-28 DIAGNOSIS — O10919 Unspecified pre-existing hypertension complicating pregnancy, unspecified trimester: Secondary | ICD-10-CM

## 2016-11-28 DIAGNOSIS — O99340 Other mental disorders complicating pregnancy, unspecified trimester: Secondary | ICD-10-CM

## 2016-11-28 DIAGNOSIS — O99343 Other mental disorders complicating pregnancy, third trimester: Secondary | ICD-10-CM | POA: Diagnosis not present

## 2016-11-28 DIAGNOSIS — O281 Abnormal biochemical finding on antenatal screening of mother: Secondary | ICD-10-CM | POA: Insufficient documentation

## 2016-11-28 DIAGNOSIS — Z362 Encounter for other antenatal screening follow-up: Secondary | ICD-10-CM | POA: Diagnosis present

## 2016-11-28 DIAGNOSIS — O403XX0 Polyhydramnios, third trimester, not applicable or unspecified: Secondary | ICD-10-CM

## 2016-11-28 DIAGNOSIS — O99332 Smoking (tobacco) complicating pregnancy, second trimester: Secondary | ICD-10-CM | POA: Diagnosis not present

## 2016-11-28 DIAGNOSIS — O0993 Supervision of high risk pregnancy, unspecified, third trimester: Secondary | ICD-10-CM

## 2016-11-28 DIAGNOSIS — E669 Obesity, unspecified: Secondary | ICD-10-CM | POA: Insufficient documentation

## 2016-11-28 DIAGNOSIS — O99213 Obesity complicating pregnancy, third trimester: Secondary | ICD-10-CM | POA: Insufficient documentation

## 2016-11-28 DIAGNOSIS — Z6841 Body Mass Index (BMI) 40.0 and over, adult: Secondary | ICD-10-CM | POA: Insufficient documentation

## 2016-11-28 DIAGNOSIS — O359XX0 Maternal care for (suspected) fetal abnormality and damage, unspecified, not applicable or unspecified: Secondary | ICD-10-CM | POA: Diagnosis not present

## 2016-11-28 DIAGNOSIS — O10913 Unspecified pre-existing hypertension complicating pregnancy, third trimester: Secondary | ICD-10-CM | POA: Diagnosis present

## 2016-11-28 DIAGNOSIS — O99333 Smoking (tobacco) complicating pregnancy, third trimester: Secondary | ICD-10-CM

## 2016-11-28 DIAGNOSIS — Q564 Indeterminate sex, unspecified: Secondary | ICD-10-CM

## 2016-11-28 DIAGNOSIS — O99513 Diseases of the respiratory system complicating pregnancy, third trimester: Secondary | ICD-10-CM | POA: Insufficient documentation

## 2016-11-28 LAB — POCT URINALYSIS DIP (DEVICE)
Glucose, UA: NEGATIVE mg/dL
Hgb urine dipstick: NEGATIVE
Ketones, ur: 80 mg/dL — AB
Nitrite: NEGATIVE
Protein, ur: 30 mg/dL — AB
Specific Gravity, Urine: 1.025 (ref 1.005–1.030)
Urobilinogen, UA: 0.2 mg/dL (ref 0.0–1.0)
pH: 6.5 (ref 5.0–8.0)
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
## 2016-11-28 NOTE — Progress Notes (Signed)
US for growth and BPP today 

## 2016-11-29 LAB — GC/CHLAMYDIA PROBE AMP (~~LOC~~) NOT AT ARMC
Chlamydia: NEGATIVE
Neisseria Gonorrhea: NEGATIVE

## 2016-11-30 LAB — STREP GP B NAA: Strep Gp B NAA: POSITIVE — AB

## 2016-12-01 ENCOUNTER — Inpatient Hospital Stay (HOSPITAL_COMMUNITY): Payer: Medicaid Other | Admitting: Anesthesiology

## 2016-12-01 ENCOUNTER — Encounter (HOSPITAL_COMMUNITY): Payer: Self-pay | Admitting: *Deleted

## 2016-12-01 ENCOUNTER — Inpatient Hospital Stay (HOSPITAL_COMMUNITY)
Admission: AD | Admit: 2016-12-01 | Discharge: 2016-12-02 | DRG: 775 | Disposition: A | Discharge status: Home / Self Care | Payer: Medicaid Other | Source: Ambulatory Visit | Attending: Obstetrics and Gynecology | Admitting: Obstetrics and Gynecology

## 2016-12-01 DIAGNOSIS — Z8673 Personal history of transient ischemic attack (TIA), and cerebral infarction without residual deficits: Secondary | ICD-10-CM

## 2016-12-01 DIAGNOSIS — O403XX0 Polyhydramnios, third trimester, not applicable or unspecified: Secondary | ICD-10-CM | POA: Diagnosis present

## 2016-12-01 DIAGNOSIS — Z3493 Encounter for supervision of normal pregnancy, unspecified, third trimester: Secondary | ICD-10-CM | POA: Diagnosis present

## 2016-12-01 DIAGNOSIS — O0990 Supervision of high risk pregnancy, unspecified, unspecified trimester: Secondary | ICD-10-CM

## 2016-12-01 DIAGNOSIS — J45909 Unspecified asthma, uncomplicated: Secondary | ICD-10-CM | POA: Diagnosis present

## 2016-12-01 DIAGNOSIS — O9952 Diseases of the respiratory system complicating childbirth: Secondary | ICD-10-CM | POA: Diagnosis present

## 2016-12-01 DIAGNOSIS — O1092 Unspecified pre-existing hypertension complicating childbirth: Secondary | ICD-10-CM

## 2016-12-01 DIAGNOSIS — F17210 Nicotine dependence, cigarettes, uncomplicated: Secondary | ICD-10-CM | POA: Diagnosis present

## 2016-12-01 DIAGNOSIS — Z3A36 36 weeks gestation of pregnancy: Secondary | ICD-10-CM

## 2016-12-01 DIAGNOSIS — F329 Major depressive disorder, single episode, unspecified: Secondary | ICD-10-CM

## 2016-12-01 DIAGNOSIS — O99334 Smoking (tobacco) complicating childbirth: Secondary | ICD-10-CM | POA: Diagnosis present

## 2016-12-01 DIAGNOSIS — J4520 Mild intermittent asthma, uncomplicated: Secondary | ICD-10-CM

## 2016-12-01 DIAGNOSIS — Z7982 Long term (current) use of aspirin: Secondary | ICD-10-CM | POA: Diagnosis not present

## 2016-12-01 DIAGNOSIS — O409XX0 Polyhydramnios, unspecified trimester, not applicable or unspecified: Secondary | ICD-10-CM | POA: Clinically undetermined

## 2016-12-01 DIAGNOSIS — F32A Depression, unspecified: Secondary | ICD-10-CM

## 2016-12-01 DIAGNOSIS — O99824 Streptococcus B carrier state complicating childbirth: Secondary | ICD-10-CM | POA: Diagnosis present

## 2016-12-01 DIAGNOSIS — O99340 Other mental disorders complicating pregnancy, unspecified trimester: Secondary | ICD-10-CM

## 2016-12-01 LAB — TYPE AND SCREEN
ABO/RH(D): A POS
Antibody Screen: NEGATIVE

## 2016-12-01 LAB — CBC
HCT: 31.8 % — ABNORMAL LOW (ref 36.0–46.0)
Hemoglobin: 11.4 g/dL — ABNORMAL LOW (ref 12.0–15.0)
MCH: 29.8 pg (ref 26.0–34.0)
MCHC: 35.8 g/dL (ref 30.0–36.0)
MCV: 83 fL (ref 78.0–100.0)
Platelets: 345 10*3/uL (ref 150–400)
RBC: 3.83 MIL/uL — ABNORMAL LOW (ref 3.87–5.11)
RDW: 13.6 % (ref 11.5–15.5)
WBC: 9.7 10*3/uL (ref 4.0–10.5)

## 2016-12-01 LAB — RPR: RPR Ser Ql: NONREACTIVE

## 2016-12-01 MED ORDER — PRENATAL MULTIVITAMIN CH
1.0000 | ORAL_TABLET | Freq: Every day | ORAL | Status: DC
  Administered 2016-12-02: 1 via ORAL
  Filled 2016-12-01: qty 1

## 2016-12-01 MED ORDER — ALUM & MAG HYDROXIDE-SIMETH 200-200-20 MG/5ML PO SUSP
30.0000 mL | Freq: Once | ORAL | Status: AC
  Administered 2016-12-01: 30 mL via ORAL
  Filled 2016-12-01: qty 30

## 2016-12-01 MED ORDER — EPHEDRINE 5 MG/ML INJ: 10.0000 mg | INTRAVENOUS | Status: DC | PRN

## 2016-12-01 MED ORDER — FENTANYL 2.5 MCG/ML BUPIVACAINE 1/10 % EPIDURAL INFUSION (WH - ANES)
14.0000 mL/h | INTRAMUSCULAR | Status: DC | PRN
  Administered 2016-12-01: 14 mL/h via EPIDURAL
  Filled 2016-12-01: qty 100

## 2016-12-01 MED ORDER — COCONUT OIL OIL
1.0000 | TOPICAL_OIL | Status: DC | PRN
Start: 2016-12-01 — End: 2016-12-02

## 2016-12-01 MED ORDER — DIBUCAINE 1 % RE OINT: 1.0000 "application " | TOPICAL_OINTMENT | RECTAL | Status: DC | PRN

## 2016-12-01 MED ORDER — LACTATED RINGERS IV SOLN
500.0000 mL | Freq: Once | INTRAVENOUS | Status: AC
  Administered 2016-12-01: 500 mL via INTRAVENOUS

## 2016-12-01 MED ORDER — SERTRALINE HCL 100 MG PO TABS
100.0000 mg | ORAL_TABLET | Freq: Every day | ORAL | Status: DC
  Filled 2016-12-01: qty 1

## 2016-12-01 MED ORDER — FLEET ENEMA 7-19 GM/118ML RE ENEM: 1.0000 | ENEMA | RECTAL | Status: DC | PRN

## 2016-12-01 MED ORDER — ZOLPIDEM TARTRATE 5 MG PO TABS: 5.0000 mg | ORAL_TABLET | Freq: Every evening | ORAL | Status: DC | PRN

## 2016-12-01 MED ORDER — SODIUM CHLORIDE 0.9% FLUSH: 3.0000 mL | INTRAVENOUS | Status: DC | PRN

## 2016-12-01 MED ORDER — PHENYLEPHRINE 40 MCG/ML (10ML) SYRINGE FOR IV PUSH (FOR BLOOD PRESSURE SUPPORT): 80.0000 ug | PREFILLED_SYRINGE | INTRAVENOUS | Status: DC | PRN

## 2016-12-01 MED ORDER — SENNOSIDES-DOCUSATE SODIUM 8.6-50 MG PO TABS
2.0000 | ORAL_TABLET | ORAL | Status: DC
  Administered 2016-12-02: 2 via ORAL
  Filled 2016-12-01: qty 2

## 2016-12-01 MED ORDER — SIMETHICONE 80 MG PO CHEW: 80.0000 mg | CHEWABLE_TABLET | ORAL | Status: DC | PRN

## 2016-12-01 MED ORDER — ONDANSETRON HCL 4 MG/2ML IJ SOLN: 4.0000 mg | Freq: Four times a day (QID) | INTRAMUSCULAR | Status: DC | PRN

## 2016-12-01 MED ORDER — SOD CITRATE-CITRIC ACID 500-334 MG/5ML PO SOLN: 30.0000 mL | ORAL | Status: DC | PRN

## 2016-12-01 MED ORDER — MEASLES, MUMPS & RUBELLA VAC ~~LOC~~ INJ: 0.5000 mL | INJECTION | Freq: Once | SUBCUTANEOUS | Status: DC

## 2016-12-01 MED ORDER — LACTATED RINGERS IV SOLN: 500.0000 mL | INTRAVENOUS | Status: DC | PRN

## 2016-12-01 MED ORDER — LACTATED RINGERS IV SOLN
INTRAVENOUS | Status: DC
  Administered 2016-12-01: 125 mL/h via INTRAVENOUS

## 2016-12-01 MED ORDER — AMPICILLIN SODIUM 2 G IJ SOLR
2.0000 g | Freq: Once | INTRAMUSCULAR | Status: AC
  Administered 2016-12-01: 2 g via INTRAVENOUS
  Filled 2016-12-01: qty 2000

## 2016-12-01 MED ORDER — DIPHENHYDRAMINE HCL 25 MG PO CAPS: 25.0000 mg | ORAL_CAPSULE | Freq: Four times a day (QID) | ORAL | Status: DC | PRN

## 2016-12-01 MED ORDER — SODIUM CHLORIDE 0.9% FLUSH: 3.0000 mL | Freq: Two times a day (BID) | INTRAVENOUS | Status: DC

## 2016-12-01 MED ORDER — BENZOCAINE-MENTHOL 20-0.5 % EX AERO: 1.0000 "application " | INHALATION_SPRAY | CUTANEOUS | Status: DC | PRN

## 2016-12-01 MED ORDER — ACETAMINOPHEN 325 MG PO TABS: 650.0000 mg | ORAL_TABLET | ORAL | Status: DC | PRN

## 2016-12-01 MED ORDER — BETAMETHASONE SOD PHOS & ACET 6 (3-3) MG/ML IJ SUSP
12.0000 mg | INTRAMUSCULAR | Status: DC
Start: 2016-12-01 — End: 2016-12-01
  Administered 2016-12-01: 12 mg via INTRAMUSCULAR
  Filled 2016-12-01: qty 2

## 2016-12-01 MED ORDER — OXYTOCIN BOLUS FROM INFUSION
500.0000 mL | Freq: Once | INTRAVENOUS | Status: AC
  Administered 2016-12-01: 500 mL via INTRAVENOUS

## 2016-12-01 MED ORDER — LIDOCAINE HCL (PF) 1 % IJ SOLN
INTRAMUSCULAR | Status: DC | PRN
  Administered 2016-12-01: 3 mL
  Administered 2016-12-01: 5 mL
  Administered 2016-12-01: 2 mL

## 2016-12-01 MED ORDER — IBUPROFEN 600 MG PO TABS
600.0000 mg | ORAL_TABLET | Freq: Four times a day (QID) | ORAL | Status: DC
  Administered 2016-12-01 – 2016-12-02 (×5): 600 mg via ORAL
  Filled 2016-12-01 (×5): qty 1

## 2016-12-01 MED ORDER — SODIUM CHLORIDE 0.9 % IV SOLN
2.0000 g | Freq: Once | INTRAVENOUS | Status: AC
  Administered 2016-12-01: 2 g via INTRAVENOUS
  Filled 2016-12-01: qty 2000

## 2016-12-01 MED ORDER — SODIUM CHLORIDE 0.9 % IV SOLN: 250.0000 mL | INTRAVENOUS | Status: DC | PRN

## 2016-12-01 MED ORDER — LACTATED RINGERS IV SOLN: 500.0000 mL | Freq: Once | INTRAVENOUS | Status: AC

## 2016-12-01 MED ORDER — OXYTOCIN 40 UNITS IN LACTATED RINGERS INFUSION - SIMPLE MED
2.5000 [IU]/h | INTRAVENOUS | Status: DC
  Administered 2016-12-01: 2.5 [IU]/h via INTRAVENOUS
  Filled 2016-12-01: qty 1000

## 2016-12-01 MED ORDER — TETANUS-DIPHTH-ACELL PERTUSSIS 5-2.5-18.5 LF-MCG/0.5 IM SUSP: 0.5000 mL | Freq: Once | INTRAMUSCULAR | Status: DC

## 2016-12-01 MED ORDER — OXYCODONE-ACETAMINOPHEN 5-325 MG PO TABS: 2.0000 | ORAL_TABLET | ORAL | Status: DC | PRN

## 2016-12-01 MED ORDER — DIPHENHYDRAMINE HCL 50 MG/ML IJ SOLN: 12.5000 mg | INTRAMUSCULAR | Status: DC | PRN

## 2016-12-01 MED ORDER — PHENYLEPHRINE 40 MCG/ML (10ML) SYRINGE FOR IV PUSH (FOR BLOOD PRESSURE SUPPORT)
80.0000 ug | PREFILLED_SYRINGE | INTRAVENOUS | Status: DC | PRN
  Filled 2016-12-01: qty 10

## 2016-12-01 MED ORDER — SERTRALINE HCL 100 MG PO TABS
100.0000 mg | ORAL_TABLET | Freq: Every day | ORAL | Status: DC
  Administered 2016-12-01: 100 mg via ORAL
  Filled 2016-12-01 (×2): qty 1

## 2016-12-01 MED ORDER — LIDOCAINE HCL (PF) 1 % IJ SOLN
30.0000 mL | INTRAMUSCULAR | Status: DC | PRN
  Filled 2016-12-01: qty 30

## 2016-12-01 MED ORDER — OXYCODONE-ACETAMINOPHEN 5-325 MG PO TABS: 1.0000 | ORAL_TABLET | ORAL | Status: DC | PRN

## 2016-12-01 MED ORDER — ASPIRIN EC 81 MG PO TBEC
81.0000 mg | DELAYED_RELEASE_TABLET | Freq: Every day | ORAL | Status: DC
  Filled 2016-12-01: qty 1

## 2016-12-01 MED ORDER — ONDANSETRON HCL 4 MG/2ML IJ SOLN: 4.0000 mg | INTRAMUSCULAR | Status: DC | PRN

## 2016-12-01 MED ORDER — WITCH HAZEL-GLYCERIN EX PADS: 1.0000 "application " | MEDICATED_PAD | CUTANEOUS | Status: DC | PRN

## 2016-12-01 MED ORDER — ONDANSETRON HCL 4 MG PO TABS: 4.0000 mg | ORAL_TABLET | ORAL | Status: DC | PRN

## 2016-12-01 NOTE — Anesthesia Procedure Notes (Signed)
Epidural Patient location during procedure: OB Start time: 12/01/2016 6:00 AM End time: 12/01/2016 6:09 AM  Staffing Anesthesiologist: Marcene Duos Performed: anesthesiologist   Preanesthetic Checklist Completed: patient identified, site marked, surgical consent, pre-op evaluation, timeout performed, IV checked, risks and benefits discussed and monitors and equipment checked  Epidural Patient position: sitting Prep: site prepped and draped and DuraPrep Patient monitoring: continuous pulse ox and blood pressure Approach: midline Location: L4-L5 Injection technique: LOR air  Needle:  Needle type: Tuohy  Needle gauge: 17 G Needle length: 9 cm and 9 Needle insertion depth: 9 cm Catheter type: closed end flexible Catheter size: 19 Gauge Catheter at skin depth: 19 cm Test dose: negative  Assessment Events: blood not aspirated, injection not painful, no injection resistance, negative IV test and no paresthesia
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
## 2016-12-01 NOTE — Lactation Note (Signed)
This note was copied from a baby's chart. Lactation Consultation Note  Patient Name: Julia Hopkins [id number] Date: 12/01/2016 Reason for consult: Preterm <34wks;Initial assessment   P2, Baby 4 hours old.  [redacted]w[redacted]d. Mother states she plans to continue to smoke.  With her first child she initially had good milk supply but daughter was losing weight, then at 6 weeks her supply diminished even further. Discussed how smoking can cause low milk supply and the benefits of breastfeeding. She states she is unsure how she wants to feed her baby.  Earlier baby latched per mom for 10 min. Offered to set up DEBP and start mother pumping and mother was agreeable. Mother pumped approx 6 ml which she will give in bottle(mother's preference) when baby wakes. Reviewed LPI feeding plan, volume guidelines, syringe and spoon feeding, milk storage and cleaning pump parts. Mom encouraged to feed baby 8-12 times/24 hours and with feeding cues q 3 hours.  Then post pump at least 4-6 times per day. Give volume back to baby with the difference w/ formula. Suggest talking with WIC to get pump loaner. Mom made aware of O/P services, breastfeeding support groups, community resources, and our phone # for post-discharge questions.       Maternal Data Has patient been taught Hand Expression?: Yes Does the patient have breastfeeding experience prior to this delivery?: Yes  Feeding Feeding Type: Breast Fed Length of feed: 10 min  LATCH Score                   Interventions    Lactation Tools Discussed/Used Pump Review: Setup, frequency, and cleaning;Milk Storage Initiated by:: Dahlia Byes RN IBCLC Date initiated:: 12/01/16   Consult Status Consult Status: Follow-up Date: 12/02/16 Follow-up type: In-patient    Dahlia Byes [facility] 12/01/2016, 2:19 PM

## 2016-12-01 NOTE — Anesthesia Pain Management Evaluation Note (Signed)
  CRNA Pain Management Visit Note  Patient: Julia Hopkins, 26 y.o., female  "Hello I am a member of the anesthesia team at [facility]. We have an anesthesia team available at all times to provide care throughout the hospital, including epidural management and anesthesia for C-section. I don't know your plan for the delivery whether it a natural birth, water birth, IV sedation, nitrous supplementation, doula or epidural, but we want to meet your pain goals."   1.Was your pain managed to your expectations on prior hospitalizations?   Yes   2.What is your expectation for pain management during this hospitalization?     Epidural  3.How can we help you reach that goal? Support prn  Record the patient's initial score and the patient's pain goal.   Pain: 9  Pain Goal: 6   Pt dilated to 9 cm.  Pt bolused epidural immediately prior to interview.  The [facility] wants you to be able to say your pain was always managed very well.  [facility] 12/01/2016

## 2016-12-01 NOTE — Anesthesia Preprocedure Evaluation (Signed)
Anesthesia Evaluation  Patient identified by MRN, date of birth, ID band Patient awake    Reviewed: Allergy & Precautions, Patient's Chart, lab work & pertinent test results  Airway Mallampati: II       Dental   Pulmonary asthma , Current Smoker,    Pulmonary exam normal        Cardiovascular hypertension, Normal cardiovascular exam     Neuro/Psych Depression CVA    GI/Hepatic negative GI ROS, Neg liver ROS,   Endo/Other  negative endocrine ROS  Renal/GU negative Renal ROS     Musculoskeletal   Abdominal   Peds  Hematology negative hematology ROS (+)   Anesthesia Other Findings   Reproductive/Obstetrics (+) Pregnancy                             Lab Results  Component Value Date   WBC 9.7 12/01/2016   HGB 11.4 (L) 12/01/2016   HCT 31.8 (L) 12/01/2016   MCV 83.0 12/01/2016   PLT 345 12/01/2016    Anesthesia Physical Anesthesia Plan  ASA: II  Anesthesia Plan: Epidural   Post-op Pain Management:    Induction:   PONV Risk Score and Plan: Treatment may vary due to age or medical condition  Airway Management Planned: Natural Airway  Additional Equipment:   Intra-op Plan:   Post-operative Plan:   Informed Consent: I have reviewed the patients History and Physical, chart, labs and discussed the procedure including the risks, benefits and alternatives for the proposed anesthesia with the patient or authorized representative who has indicated his/her understanding and acceptance.     Plan Discussed with:   Anesthesia Plan Comments:         Anesthesia Quick Evaluation

## 2016-12-01 NOTE — MAU Note (Signed)
Ctx since yesterday morning. Gotten stronger over the past few hours. Lost mucus plug. denies any vag bleeding or discharge. Good fetal movement

## 2016-12-01 NOTE — Anesthesia Postprocedure Evaluation (Signed)
Anesthesia Post Note  Patient: Julia Hopkins  Procedure(s) Performed: * No procedures listed *     Patient location during evaluation: Mother Baby Anesthesia Type: Epidural Level of consciousness: awake and alert and oriented Pain management: satisfactory to patient Vital Signs Assessment: post-procedure vital signs reviewed and stable Respiratory status: respiratory function stable Cardiovascular status: stable Postop Assessment: no headache, no backache, epidural receding, patient able to bend at knees, no signs of nausea or vomiting and adequate PO intake Anesthetic complications: no    Last Vitals:  Vitals:   12/01/16 1039 12/01/16 1200  BP: 136/82 138/84  Pulse: 82 81  Resp: 16 18  Temp: 37.1 C 36.7 C  SpO2:      Last Pain:  Vitals:   12/01/16 1200  TempSrc: Oral  PainSc: 0-No pain   Pain Goal:                 Barry Faircloth

## 2016-12-02 MED ORDER — IBUPROFEN 600 MG PO TABS: 600.0000 mg | ORAL_TABLET | Freq: Four times a day (QID) | ORAL | 0 refills | Status: DC

## 2016-12-02 NOTE — Progress Notes (Signed)
Post Partum Day 1 Subjective: no complaints, up ad lib, voiding, tolerating PO and + flatus  Objective: Blood pressure 132/76, pulse 88, temperature 98 F (36.7 C), resp. rate 18, height 5' 2.25" (1.581 m), weight 116.6 kg (257 lb), last menstrual period 02/28/2016, SpO2 98 %, unknown if currently breastfeeding.  Physical Exam:  General: alert and cooperative Lochia: appropriate Uterine Fundus: firm DVT Evaluation: No evidence of DVT seen on physical exam.   Recent Labs  12/01/16 0315  HGB 11.4*  HCT 31.8*    Assessment/Plan: Plan for discharge tomorrow, Social Work consult and Contraception nexplanon   LOS: 1 day   Tillman Sers 12/02/2016, 7:37 AM

## 2016-12-02 NOTE — Progress Notes (Signed)
Psychosocial assessment completed.  No barriers to discharge.  Full documentation to follow. 

## 2016-12-02 NOTE — Discharge Summary (Signed)
OB Discharge Summary     Patient Name: Julia Hopkins DOB: Sep 05, 1990 MRN: [id number]  Date of admission: 12/01/2016 Delivering MD: Wyvonnia Dusky D   Date of discharge: 12/02/2016  Admitting diagnosis: 37WKS CTX 2-3 MINS Intrauterine pregnancy: [redacted]w[redacted]d     Secondary diagnosis:  Active Problems:   Preterm labor   Polyhydramnios affecting pregnancy  Additional problems: None     Discharge diagnosis: Preterm Pregnancy Delivered                                                                                                Post partum procedures:None  Augmentation: N/A  Complications: None  Hospital course:  Onset of Labor With Vaginal Delivery     26 y.o. yo [id number] at [redacted]w[redacted]d was admitted in Active Labor on 12/01/2016. Patient had an uncomplicated labor course as follows:  Membrane Rupture Time/Date: 8:38 AM ,12/01/2016   Intrapartum Procedures: Episiotomy: None [1]                                         Lacerations:  None [1]  Patient had a delivery of a Viable infant. 12/01/2016  Information for the patient's newborn:  Aurora, Rody [[id number]]       Pateint had an uncomplicated postpartum course.  She is ambulating, tolerating a regular diet, passing flatus, and urinating well. Patient is discharged home in stable condition on 12/02/16.   Physical exam  Vitals:   12/01/16 1600 12/01/16 2322 12/02/16 0500 12/02/16 1232  BP: 128/60 131/73 132/76 136/86  Pulse: 74 82 88 85  Resp: 20 18 18    Temp: (!) 97.4 F (36.3 C) 98.7 F (37.1 C) 98 F (36.7 C)   TempSrc: Oral Oral    SpO2:      Weight:      Height:       General: alert, cooperative and no distress Lochia: appropriate Uterine Fundus: firm Incision: N/A DVT Evaluation: No evidence of DVT seen on physical exam. Labs: Lab Results  Component Value Date   WBC 9.7 12/01/2016   HGB 11.4 (L) 12/01/2016   HCT 31.8 (L) 12/01/2016   MCV 83.0 12/01/2016   PLT 345 12/01/2016   CMP Latest Ref Rng & Units 06/11/2016   Glucose 65 - 99 mg/dL 72  BUN 6 - 20 mg/dL 8  Creatinine 5.28 - 4.13 mg/dL 2.44  Sodium 010 - 272 mmol/L 139  Potassium 3.5 - 5.2 mmol/L 3.9  Chloride 96 - 106 mmol/L 100  CO2 18 - 29 mmol/L 22  Calcium 8.7 - 10.2 mg/dL 9.5  Total Protein 6.0 - 8.5 g/dL 6.7  Total Bilirubin 0.0 - 1.2 mg/dL <5.3  Alkaline Phos 39 - 117 IU/L 68  AST 0 - 40 IU/L 11  ALT 0 - 32 IU/L 16    Discharge instruction: per After Visit Summary and "Baby and Me Booklet".  After visit meds:  Allergies as of 12/02/2016   No Known Allergies     Medication List  STOP taking these medications   aspirin EC 81 MG tablet   cyclobenzaprine 10 MG tablet Commonly known as:  FLEXERIL     TAKE these medications   ibuprofen 600 MG tablet Commonly known as:  ADVIL,MOTRIN Take 1 tablet (600 mg total) by mouth every 6 (six) hours.   montelukast 10 MG tablet Commonly known as:  SINGULAIR Take 10 mg by mouth at bedtime.   sertraline 50 MG tablet Commonly known as:  ZOLOFT Take 2 tablets (100 mg total) by mouth daily. What changed:  when to take this   TARON-C DHA 53.5-38-1 MG Caps Take 1 capsule by mouth daily. What changed:  when to take this            Discharge Care Instructions        Start     Ordered   12/03/16 0000  ibuprofen (ADVIL,MOTRIN) 600 MG tablet  Every 6 hours    Question:  Supervising Provider  Answer:  Adam Phenix   12/02/16 1758   12/02/16 0000  Discharge patient    Question Answer Comment  Discharge disposition 01-Home or Self Care   Discharge patient date 12/02/2016      12/02/16 1759      Diet: routine diet  Activity: Advance as tolerated. Pelvic rest for 6 weeks.   Outpatient follow up:6 weeks Follow up Appt:Future Appointments Date Time Provider Department Center  01/13/2017 1:20 PM Armando Reichert, CNM [facility]   Follow up Visit:No Follow-up on file.  Postpartum contraception: Nexplanon  Newborn Data: Live born female  Birth Weight: 6 lb 3.1 oz  (2809 g) APGAR: 8, 9  Baby Feeding: Bottle Disposition:rooming in   12/02/2016 Sharen Counter, CNM

## 2016-12-04 ENCOUNTER — Other Ambulatory Visit: Payer: Medicaid Other

## 2016-12-04 ENCOUNTER — Encounter: Payer: Medicaid Other | Admitting: Obstetrics and Gynecology
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
## 2016-12-05 ENCOUNTER — Encounter (HOSPITAL_COMMUNITY): Payer: Self-pay

## 2016-12-05 ENCOUNTER — Emergency Department (HOSPITAL_COMMUNITY): Payer: Medicaid Other

## 2016-12-05 ENCOUNTER — Emergency Department (HOSPITAL_COMMUNITY)
Admission: EM | Admit: 2016-12-05 | Discharge: 2016-12-05 | Disposition: A | Discharge status: Home / Self Care | Payer: Medicaid Other | Attending: Emergency Medicine | Admitting: Emergency Medicine

## 2016-12-05 DIAGNOSIS — Y929 Unspecified place or not applicable: Secondary | ICD-10-CM | POA: Diagnosis not present

## 2016-12-05 DIAGNOSIS — S92354A Nondisplaced fracture of fifth metatarsal bone, right foot, initial encounter for closed fracture: Secondary | ICD-10-CM | POA: Insufficient documentation

## 2016-12-05 DIAGNOSIS — S99921A Unspecified injury of right foot, initial encounter: Secondary | ICD-10-CM | POA: Diagnosis present

## 2016-12-05 DIAGNOSIS — W108XXA Fall (on) (from) other stairs and steps, initial encounter: Secondary | ICD-10-CM | POA: Insufficient documentation

## 2016-12-05 DIAGNOSIS — Z8673 Personal history of transient ischemic attack (TIA), and cerebral infarction without residual deficits: Secondary | ICD-10-CM | POA: Insufficient documentation

## 2016-12-05 DIAGNOSIS — Y9301 Activity, walking, marching and hiking: Secondary | ICD-10-CM | POA: Diagnosis not present

## 2016-12-05 DIAGNOSIS — Y998 Other external cause status: Secondary | ICD-10-CM | POA: Insufficient documentation

## 2016-12-05 DIAGNOSIS — I10 Essential (primary) hypertension: Secondary | ICD-10-CM | POA: Insufficient documentation

## 2016-12-05 DIAGNOSIS — Z79899 Other long term (current) drug therapy: Secondary | ICD-10-CM | POA: Diagnosis not present

## 2016-12-05 DIAGNOSIS — J45909 Unspecified asthma, uncomplicated: Secondary | ICD-10-CM | POA: Diagnosis not present

## 2016-12-05 DIAGNOSIS — F17210 Nicotine dependence, cigarettes, uncomplicated: Secondary | ICD-10-CM | POA: Insufficient documentation

## 2016-12-05 MED ORDER — HYDROCODONE-ACETAMINOPHEN 5-325 MG PO TABS: 1.0000 | ORAL_TABLET | Freq: Four times a day (QID) | ORAL | 0 refills | Status: DC | PRN

## 2016-12-05 MED ORDER — HYDROCODONE-ACETAMINOPHEN 5-325 MG PO TABS
1.0000 | ORAL_TABLET | Freq: Once | ORAL | Status: AC
  Administered 2016-12-05: 1 via ORAL
  Filled 2016-12-05: qty 1

## 2016-12-05 NOTE — ED Notes (Signed)
Pt given vicodin before she left , she states she is not nursing her newborn , pt was told that the vicodin would adversely affect her newborn  If she took the narcotic and then nursed her baby . Pt states she understands that she cannot drink alcohol, drive a car operated dangerous equipment and esp take care of small children/ newborns if she took this med. Pt given cab voucher to return to maternity  House where she is staying states newborn is with the dad

## 2016-12-05 NOTE — ED Notes (Signed)
Ortho into see pt, Julia Hopkins , social worker in to see

## 2016-12-05 NOTE — ED Triage Notes (Signed)
Pt states she was walking and fell hurting her right foot. Swelling noted. Pt states pain is worse when weight bearing.

## 2016-12-05 NOTE — Progress Notes (Signed)
CSW spoke with pt at bedside. At this time pt reports recently living at [facility] in [place]. Pt reports giving birth to daughter this past Sunday. Pt reports that while staying at this facility pt had ben working on paper work to get into their house that houses women and their children after birth. Pt reports having a hard time with this as pt has been told that they are doing renovations to the facility and are unable to accept each person that applied to stay in the facility. Pt mentioned that pt had been in contact with the individual who makes the decision on who can and can not stay in the facility, but have not heard anything else.    CSW sought further resources for pt, however no facility is able to accommodate pt and 4 day old baby. CSW received an address from pt that pt is wanting to return to. CSW will provide RN with a taxi voucher for pt to utilize at the time of discharge. No further CSW needs.     Claude MangesKierra S. Hamid Brookens, MSW, LCSW-A Emergency Department Clinical Social Worker [phone]

## 2016-12-05 NOTE — Discharge Instructions (Addendum)
Orthopedic Instructions:   You may put as much weight down as comfortable on your right foot in your special shoe.  You may take the shoe off when you're not walking.  Keep leg elevated when possible.  Use ice for 20 minutes every 4 hours for next 7 days.  Please see the information and instructions below regarding your visit.  Your diagnoses today include:  1. Nondisplaced fracture of fifth metatarsal bone, right foot, initial encounter for closed fracture    This is a break in the bone that next her pinky toe to the middle of your foot.  Tests performed today include: See side panel of your discharge paperwork for testing performed today. Vital signs are listed at the bottom of these instructions.   Medications prescribed:    Take any prescribed medications only as prescribed, and any over the counter medications only as directed on the packaging.  You have been prescribed Norco for pain. This is an opioid pain medication. You may take this medication every 4-6 hours as needed for pain. Only take this medication if you need it for breakthrough pain. You may combine this medicine with Advil, a non-steroidal anti-inflammatory drug (NSAID) every 6 hours, so you are getting something for pain relief every 3 hours.  Do not combine this medication with Tylenol, as it may increase the risk of liver problems.  Do not combine this medication with alcohol.  Please be advised to avoid driving or operating heavy machinery while taking this medication, as it may make you drowsy or impair judgment.    Please ensure that your  baby is watched by someone else while taking this medication and ensure that the baby is safely in the bassinet to ensure safe sleep for both you and baby.  Home care instructions:  Please follow any educational materials contained in this packet.   Follow-up instructions: Please follow-up with orthopedics in 2 weeks for further evaluation of your fracture.     Return instructions:  Please return to the Emergency Department if you experience worsening symptoms.  Please return if you have any other emergent concerns.  Additional Information:   Your vital signs today were: BP 139/89 (BP Location: Left Arm)    Pulse 77    Temp 98.8 F (37.1 C) (Oral)    Resp 18    LMP 02/28/2016 (Exact Date)    SpO2 97%  Your blood pressure (BP) was elevated on multiple readings during this visit above 130 for the top number or above 80 for the bottom number, please have this repeated by your primary care provider within one month. --------------  Thank you for allowing us to participate in your care today.
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
## 2016-12-23 ENCOUNTER — Encounter (INDEPENDENT_AMBULATORY_CARE_PROVIDER_SITE_OTHER): Payer: Self-pay | Admitting: Physician Assistant

## 2016-12-23 ENCOUNTER — Ambulatory Visit (INDEPENDENT_AMBULATORY_CARE_PROVIDER_SITE_OTHER): Payer: Medicaid Other | Admitting: Physician Assistant

## 2016-12-23 ENCOUNTER — Ambulatory Visit (INDEPENDENT_AMBULATORY_CARE_PROVIDER_SITE_OTHER): Payer: Medicaid Other

## 2016-12-23 DIAGNOSIS — S92354A Nondisplaced fracture of fifth metatarsal bone, right foot, initial encounter for closed fracture: Secondary | ICD-10-CM | POA: Diagnosis not present
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
## 2016-12-31 ENCOUNTER — Ambulatory Visit (INDEPENDENT_AMBULATORY_CARE_PROVIDER_SITE_OTHER): Payer: Medicaid Other | Admitting: Medical

## 2016-12-31 ENCOUNTER — Encounter: Payer: Self-pay | Admitting: Medical

## 2016-12-31 DIAGNOSIS — Z1389 Encounter for screening for other disorder: Secondary | ICD-10-CM | POA: Diagnosis not present

## 2016-12-31 DIAGNOSIS — R87610 Atypical squamous cells of undetermined significance on cytologic smear of cervix (ASC-US): Secondary | ICD-10-CM

## 2016-12-31 DIAGNOSIS — I10 Essential (primary) hypertension: Secondary | ICD-10-CM

## 2016-12-31 DIAGNOSIS — Z3049 Encounter for surveillance of other contraceptives: Secondary | ICD-10-CM

## 2016-12-31 DIAGNOSIS — Z3202 Encounter for pregnancy test, result negative: Secondary | ICD-10-CM | POA: Diagnosis not present

## 2016-12-31 DIAGNOSIS — Z30019 Encounter for initial prescription of contraceptives, unspecified: Secondary | ICD-10-CM

## 2016-12-31 LAB — POCT PREGNANCY, URINE: Preg Test, Ur: NEGATIVE

## 2016-12-31 MED ORDER — ETONOGESTREL 68 MG ~~LOC~~ IMPL
68.0000 mg | DRUG_IMPLANT | Freq: Once | SUBCUTANEOUS | Status: AC
  Administered 2016-12-31: 68 mg via SUBCUTANEOUS

## 2016-12-31 MED ORDER — HYDROCHLOROTHIAZIDE 12.5 MG PO TABS: 25.0000 mg | ORAL_TABLET | Freq: Every day | ORAL | 0 refills | Status: DC

## 2016-12-31 NOTE — Patient Instructions (Signed)
Etonogestrel implant What is this medicine? ETONOGESTREL (et oh noe JES trel) is a contraceptive (birth control) device. It is used to prevent pregnancy. It can be used for up to 3 years. This medicine may be used for other purposes; ask your health care provider or pharmacist if you have questions. COMMON BRAND NAME(S): Implanon, Nexplanon What should I tell my health care provider before I take this medicine? They need to know if you have any of these conditions: -abnormal vaginal bleeding -blood vessel disease or blood clots -cancer of the breast, cervix, or liver -depression -diabetes -gallbladder disease -headaches -heart disease or recent heart attack -high blood pressure -high cholesterol -kidney disease -liver disease -renal disease -seizures -tobacco smoker -an unusual or allergic reaction to etonogestrel, other hormones, anesthetics or antiseptics, medicines, foods, dyes, or preservatives -pregnant or trying to get pregnant -breast-feeding How should I use this medicine? This device is inserted just under the skin on the inner side of your upper arm by a health care professional. Talk to your pediatrician regarding the use of this medicine in children. Special care may be needed. Overdosage: If you think you have taken too much of this medicine contact a poison control center or emergency room at once. NOTE: This medicine is only for you. Do not share this medicine with others. What if I miss a dose? This does not apply. What may interact with this medicine? Do not take this medicine with any of the following medications: -amprenavir -bosentan -fosamprenavir This medicine may also interact with the following medications: -barbiturate medicines for inducing sleep or treating seizures -certain medicines for fungal infections like ketoconazole and itraconazole -grapefruit juice -griseofulvin -medicines to treat seizures like carbamazepine, felbamate, oxcarbazepine,  phenytoin, topiramate -modafinil -phenylbutazone -rifampin -rufinamide -some medicines to treat HIV infection like atazanavir, indinavir, lopinavir, nelfinavir, tipranavir, ritonavir -St. John's wort This list may not describe all possible interactions. Give your health care provider a list of all the medicines, herbs, non-prescription drugs, or dietary supplements you use. Also tell them if you smoke, drink alcohol, or use illegal drugs. Some items may interact with your medicine. What should I watch for while using this medicine? This product does not protect you against HIV infection (AIDS) or other sexually transmitted diseases. You should be able to feel the implant by pressing your fingertips over the skin where it was inserted. Contact your doctor if you cannot feel the implant, and use a non-hormonal birth control method (such as condoms) until your doctor confirms that the implant is in place. If you feel that the implant may have broken or become bent while in your arm, contact your healthcare provider. What side effects may I notice from receiving this medicine? Side effects that you should report to your doctor or health care professional as soon as possible: -allergic reactions like skin rash, itching or hives, swelling of the face, lips, or tongue -breast lumps -changes in emotions or moods -depressed mood -heavy or prolonged menstrual bleeding -pain, irritation, swelling, or bruising at the insertion site -scar at site of insertion -signs of infection at the insertion site such as fever, and skin redness, pain or discharge -signs of pregnancy -signs and symptoms of a blood clot such as breathing problems; changes in vision; chest pain; severe, sudden headache; pain, swelling, warmth in the leg; trouble speaking; sudden numbness or weakness of the face, arm or leg -signs and symptoms of liver injury like dark yellow or brown urine; general ill feeling or flu-like symptoms;  light-colored   stools; loss of appetite; nausea; right upper belly pain; unusually weak or tired; yellowing of the eyes or skin -unusual vaginal bleeding, discharge -signs and symptoms of a stroke like changes in vision; confusion; trouble speaking or understanding; severe headaches; sudden numbness or weakness of the face, arm or leg; trouble walking; dizziness; loss of balance or coordination Side effects that usually do not require medical attention (report to your doctor or health care professional if they continue or are bothersome): -acne -back pain -breast pain -changes in weight -dizziness -general ill feeling or flu-like symptoms -headache -irregular menstrual bleeding -nausea -sore throat -vaginal irritation or inflammation This list may not describe all possible side effects. Call your doctor for medical advice about side effects. You may report side effects to FDA at 1-800-FDA-1088. Where should I keep my medicine? This drug is given in a hospital or clinic and will not be stored at home. NOTE: This sheet is a summary. It may not cover all possible information. If you have questions about this medicine, talk to your doctor, pharmacist, or health care provider.  2018 Elsevier/Gold Standard (2015-10-12 11:19:22) Hypertension Hypertension is another name for high blood pressure. High blood pressure forces your heart to work harder to pump blood. This can cause problems over time. There are two numbers in a blood pressure reading. There is a top number (systolic) over a bottom number (diastolic). It is best to have a blood pressure below 120/80. Healthy choices can help lower your blood pressure. You may need medicine to help lower your blood pressure if:  Your blood pressure cannot be lowered with healthy choices.  Your blood pressure is higher than 130/80.  Follow these instructions at home: Eating and drinking  If directed, follow the DASH eating plan. This diet  includes: ? Filling half of your plate at each meal with fruits and vegetables. ? Filling one quarter of your plate at each meal with whole grains. Whole grains include whole wheat pasta, brown rice, and whole grain bread. ? Eating or drinking low-fat dairy products, such as skim milk or low-fat yogurt. ? Filling one quarter of your plate at each meal with low-fat (lean) proteins. Low-fat proteins include fish, skinless chicken, eggs, beans, and tofu. ? Avoiding fatty meat, cured and processed meat, or chicken with skin. ? Avoiding premade or processed food.  Eat less than 1,500 mg of salt (sodium) a day.  Limit alcohol use to no more than 1 drink a day for nonpregnant women and 2 drinks a day for men. One drink equals 12 oz of beer, 5 oz of wine, or 1 oz of hard liquor. Lifestyle  Work with your doctor to stay at a healthy weight or to lose weight. Ask your doctor what the best weight is for you.  Get at least 30 minutes of exercise that causes your heart to beat faster (aerobic exercise) most days of the week. This may include walking, swimming, or biking.  Get at least 30 minutes of exercise that strengthens your muscles (resistance exercise) at least 3 days a week. This may include lifting weights or pilates.  Do not use any products that contain nicotine or tobacco. This includes cigarettes and e-cigarettes. If you need help quitting, ask your doctor.  Check your blood pressure at home as told by your doctor.  Keep all follow-up visits as told by your doctor. This is important. Medicines  Take over-the-counter and prescription medicines only as told by your doctor. Follow directions carefully.  Do not  skip doses of blood pressure medicine. The medicine does not work as well if you skip doses. Skipping doses also puts you at risk for problems.  Ask your doctor about side effects or reactions to medicines that you should watch for. Contact a doctor if:  You think you are having a  reaction to the medicine you are taking.  You have headaches that keep coming back (recurring).  You feel dizzy.  You have swelling in your ankles.  You have trouble with your vision. Get help right away if:  You get a very bad headache.  You start to feel confused.  You feel weak or numb.  You feel faint.  You get very bad pain in your: ? Chest. ? Belly (abdomen).  You throw up (vomit) more than once.  You have trouble breathing. Summary  Hypertension is another name for high blood pressure.  Making healthy choices can help lower blood pressure. If your blood pressure cannot be controlled with healthy choices, you may need to take medicine. This information is not intended to replace advice given to you by your health care provider. Make sure you discuss any questions you have with your health care provider. Document Released: 09/11/2007 Document Revised: 02/21/2016 Document Reviewed: 02/21/2016 Elsevier Interactive Patient Education  [employer].

## 2016-12-31 NOTE — Progress Notes (Signed)
Subjective:     Julia Hopkins is a 26 y.o. female who presents for a postpartum visit. She is 4 weeks postpartum following a spontaneous vaginal delivery. I have fully reviewed the prenatal and intrapartum course. The delivery was at 36 gestational weeks. Outcome: spontaneous vaginal delivery. Anesthesia: epidural. Postpartum course has been normal. Baby's course has been normal. Baby is feeding by both breast and bottle - Similac Advance. Bleeding no bleeding. Bowel function is normal. Bladder function is normal. Patient is sexually active, last intercourse > 2 weeks ago. Contraception method is none. Postpartum depression screening: positive.  The following portions of the patient's history were reviewed and updated as appropriate: allergies, current medications, past family history, past medical history, past social history, past surgical history and problem list.  Review of Systems Pertinent items are noted in HPI.   Objective:    BP (!) 145/96   Pulse 84   Ht  (1.575 m)   Wt 240 lb 8 oz (109.1 kg)   LMP  (LMP Unknown)   BMI 43.99 kg/m   General:  alert and cooperative   Breasts:  not evaluated  Lungs: clear to auscultation bilaterally  Heart:  regular rate and rhythm, S1, S2 normal, no murmur, click, rub or gallop  Abdomen: soft, non-tender; bowel sounds normal; no masses,  no organomegaly   Vulva:  not evaluated  Vagina: not evaluated  Cervix:  not evaluated  Corpus: not examined  Adnexa:  not evaluated  Rectal Exam: Not performed.         GYNECOLOGY CLINIC PROCEDURE NOTE  Nexplanon Insertion Procedure Patient was given informed consent, she signed consent form.  Patient does understand that irregular bleeding is a very common side effect of this medication. She was advised to have backup contraception for one week after placement. Pregnancy test in clinic today was negative.  Appropriate time out taken.  Patient's left arm was prepped and draped in the usual sterile  fashion.. The ruler used to measure and mark insertion area.  Patient was prepped with alcohol swab and then injected with 3 ml of 1% lidocaine.  She was prepped with betadine, Nexplanon removed from packaging,  Device confirmed in needle, then inserted full length of needle and withdrawn per handbook instructions. Nexplanon was able to palpated in the patient's arm; patient palpated the insert herself. There was minimal blood loss.  Patient insertion site covered with guaze and a pressure bandage to reduce any bruising.  The patient tolerated the procedure well and was given post procedure instructions.    Assessment:     Normal postpartum exam. Pap smear not done at today's visit. ASCUS with negative HRHPV 2018, needs repeat with cotesting in 3 years.  Nexplanon insertion  Plan:    1. Contraception: Nexplanon 2. Follow-up with PCP for HTN management  3. Follow up in: 1 year for annual exam or sooner as needed.    Marny Lowenstein, PA-C 12/31/2016 11:05 AM

## 2017-01-08 ENCOUNTER — Ambulatory Visit: Payer: Self-pay

## 2017-01-13 ENCOUNTER — Ambulatory Visit: Payer: Medicaid Other | Admitting: Advanced Practice Midwife

## 2017-01-14 ENCOUNTER — Telehealth: Payer: Self-pay | Admitting: *Deleted

## 2017-01-14 MED ORDER — SERTRALINE HCL 100 MG PO TABS: 100.0000 mg | ORAL_TABLET | Freq: Every day | ORAL | 1 refills | Status: DC

## 2017-01-14 MED ORDER — FLUTICASONE PROPIONATE HFA 110 MCG/ACT IN AERO: 2.0000 | INHALATION_SPRAY | Freq: Two times a day (BID) | RESPIRATORY_TRACT | 0 refills | Status: DC

## 2017-01-14 MED ORDER — ALBUTEROL SULFATE HFA 108 (90 BASE) MCG/ACT IN AERS: 2.0000 | INHALATION_SPRAY | Freq: Four times a day (QID) | RESPIRATORY_TRACT | 0 refills | Status: DC | PRN

## 2017-01-14 NOTE — Telephone Encounter (Signed)
Fax received yesterday evening from after hours service which states that pt's pharmacy has questions regarding medications. I called [facility] today and spoke w/pharmacist. Medicaid will not cover the Rx for Sertraline as currently written - it needs to be written for 100 mg tablets. Also pt is requesting Rx for Flovent inhaler and Pro Air (albuterol) inhaler. I discussed w/Julie Magnus Sinning, PA and received orders which were then e-prescribed. I called pt and informed her of prescriptions sent to pharmacy and that she will need a PCP to prescribe these medications in the future. Pt stated that she does not currently have a PCP due to she was unhappy [facility] and stopped going. I offered to assist with referral back to [facility] to possibly see a different provider and pt declined. She stated that she will look elsewhere for care. Pt was also advised to go to Social Services to have her Medicaid updated since her current pregnancy Medicaid will expire on 02/05/17 and she will no longer have coverage for medications or medical care. She  voiced understanding of all information given.

## 2017-01-15 ENCOUNTER — Ambulatory Visit (INDEPENDENT_AMBULATORY_CARE_PROVIDER_SITE_OTHER): Payer: Medicaid Other | Admitting: Physician Assistant

## 2017-01-22 ENCOUNTER — Ambulatory Visit (INDEPENDENT_AMBULATORY_CARE_PROVIDER_SITE_OTHER): Payer: Medicaid Other | Admitting: Physician Assistant

## 2017-01-27 ENCOUNTER — Ambulatory Visit (INDEPENDENT_AMBULATORY_CARE_PROVIDER_SITE_OTHER): Payer: Medicaid Other | Admitting: Physician Assistant

## 2017-01-27 ENCOUNTER — Encounter (INDEPENDENT_AMBULATORY_CARE_PROVIDER_SITE_OTHER): Payer: Self-pay | Admitting: Physician Assistant

## 2017-01-27 ENCOUNTER — Ambulatory Visit (INDEPENDENT_AMBULATORY_CARE_PROVIDER_SITE_OTHER): Payer: Medicaid Other

## 2017-01-27 DIAGNOSIS — S92354A Nondisplaced fracture of fifth metatarsal bone, right foot, initial encounter for closed fracture: Secondary | ICD-10-CM

## 2017-01-27 NOTE — Progress Notes (Signed)
Burnard BuntingKeiya returns today for follow-up of her right foot base the fifth metatarsal fracture.  She states that she has been walking just in a regular shoe.  She is having some pain in the foot.  She is asking for a Cam walker boot today.  Physical exam right foot: There is over the base of the fifth metatarsal.  There is no rashes, skin lesions, erythema or edema.  Remainder of the foot is nontender.  Radiographs: 3 views right foot shows some consolidation. The fracture site remains quite evident.  No change in the overall alignment position of the fracture.  No other fractures throughout the foot.   Plan: Place her in a Cam walker boot today weightbearing as tolerated.  She can remove the boot for bathing. See her back in 1 month .Did discuss with her smoking cessation again .

## 2017-02-10 ENCOUNTER — Telehealth (INDEPENDENT_AMBULATORY_CARE_PROVIDER_SITE_OTHER): Payer: Self-pay | Admitting: Orthopaedic Surgery

## 2017-02-10 NOTE — Telephone Encounter (Signed)
Patient called asking for a letter to be emailed to her in regards to her broken toe, she is just needing a note stating that it is still broken. If it cannot be emailed if you could just give her a call whenever it's ready. CB # [phone] Email: [email or web address]

## 2017-02-11 ENCOUNTER — Encounter (INDEPENDENT_AMBULATORY_CARE_PROVIDER_SITE_OTHER): Payer: Self-pay

## 2017-02-11 NOTE — Telephone Encounter (Signed)
Can you email this to her? I laid letter on your desk  Thank you so much!!!

## 2017-02-24 ENCOUNTER — Ambulatory Visit (INDEPENDENT_AMBULATORY_CARE_PROVIDER_SITE_OTHER): Payer: Medicaid Other | Admitting: Physician Assistant

## 2017-03-27 ENCOUNTER — Ambulatory Visit (INDEPENDENT_AMBULATORY_CARE_PROVIDER_SITE_OTHER): Payer: Medicaid Other | Admitting: Physician Assistant

## 2017-04-22 ENCOUNTER — Encounter (HOSPITAL_COMMUNITY): Payer: Self-pay | Admitting: Nurse Practitioner

## 2017-04-22 ENCOUNTER — Other Ambulatory Visit: Payer: Self-pay

## 2017-04-22 ENCOUNTER — Emergency Department (HOSPITAL_COMMUNITY)
Admission: EM | Admit: 2017-04-22 | Discharge: 2017-04-22 | Disposition: A | Discharge status: Home / Self Care | Payer: Medicaid Other | Attending: Emergency Medicine | Admitting: Emergency Medicine

## 2017-04-22 DIAGNOSIS — Z79899 Other long term (current) drug therapy: Secondary | ICD-10-CM | POA: Diagnosis not present

## 2017-04-22 DIAGNOSIS — J45909 Unspecified asthma, uncomplicated: Secondary | ICD-10-CM | POA: Insufficient documentation

## 2017-04-22 DIAGNOSIS — I10 Essential (primary) hypertension: Secondary | ICD-10-CM | POA: Insufficient documentation

## 2017-04-22 DIAGNOSIS — R112 Nausea with vomiting, unspecified: Secondary | ICD-10-CM | POA: Diagnosis not present

## 2017-04-22 DIAGNOSIS — B360 Pityriasis versicolor: Secondary | ICD-10-CM | POA: Insufficient documentation

## 2017-04-22 DIAGNOSIS — F17210 Nicotine dependence, cigarettes, uncomplicated: Secondary | ICD-10-CM | POA: Diagnosis not present

## 2017-04-22 DIAGNOSIS — R197 Diarrhea, unspecified: Secondary | ICD-10-CM | POA: Diagnosis not present

## 2017-04-22 LAB — COMPREHENSIVE METABOLIC PANEL
ALT: 22 U/L (ref 14–54)
AST: 19 U/L (ref 15–41)
Albumin: 3.4 g/dL — ABNORMAL LOW (ref 3.5–5.0)
Alkaline Phosphatase: 51 U/L (ref 38–126)
Anion gap: 7 (ref 5–15)
BUN: 10 mg/dL (ref 6–20)
CO2: 19 mmol/L — ABNORMAL LOW (ref 22–32)
Calcium: 8.7 mg/dL — ABNORMAL LOW (ref 8.9–10.3)
Chloride: 113 mmol/L — ABNORMAL HIGH (ref 101–111)
Creatinine, Ser: 0.85 mg/dL (ref 0.44–1.00)
GFR calc Af Amer: >60 mL/min (ref >60)
GFR calc non Af Amer: >60 mL/min (ref >60)
Glucose, Bld: 105 mg/dL — ABNORMAL HIGH (ref 65–99)
Potassium: 3.8 mmol/L (ref 3.5–5.1)
Sodium: 139 mmol/L (ref 135–145)
Total Bilirubin: 0.3 mg/dL (ref 0.3–1.2)
Total Protein: 5.8 g/dL — ABNORMAL LOW (ref 6.5–8.1)

## 2017-04-22 LAB — CBC
HCT: 39.8 % (ref 36.0–46.0)
Hemoglobin: 13 g/dL (ref 12.0–15.0)
MCH: 27.8 pg (ref 26.0–34.0)
MCHC: 32.7 g/dL (ref 30.0–36.0)
MCV: 85.2 fL (ref 78.0–100.0)
Platelets: 262 10*3/uL (ref 150–400)
RBC: 4.67 MIL/uL (ref 3.87–5.11)
RDW: 13.7 % (ref 11.5–15.5)
WBC: 6.1 10*3/uL (ref 4.0–10.5)

## 2017-04-22 LAB — URINALYSIS, ROUTINE W REFLEX MICROSCOPIC
Bacteria, UA: NONE SEEN
Bilirubin Urine: NEGATIVE
Glucose, UA: NEGATIVE mg/dL
Ketones, ur: NEGATIVE mg/dL
Leukocytes, UA: NEGATIVE
Nitrite: NEGATIVE
Protein, ur: NEGATIVE mg/dL
Specific Gravity, Urine: 1.019 (ref 1.005–1.030)
pH: 7 (ref 5.0–8.0)

## 2017-04-22 LAB — I-STAT BETA HCG BLOOD, ED (MC, WL, AP ONLY): I-stat hCG, quantitative: <5 m[IU]/mL (ref <5)

## 2017-04-22 LAB — LIPASE, BLOOD: Lipase: 30 U/L (ref 11–51)

## 2017-04-22 MED ORDER — ONDANSETRON HCL 4 MG PO TABS: 4.0000 mg | ORAL_TABLET | Freq: Four times a day (QID) | ORAL | 0 refills | Status: DC

## 2017-04-22 MED ORDER — TERBINAFINE HCL 1 % EX CREA: 1.0000 "application " | TOPICAL_CREAM | Freq: Two times a day (BID) | CUTANEOUS | 0 refills | Status: DC

## 2017-04-22 MED ORDER — LOPERAMIDE HCL 2 MG PO CAPS: 2.0000 mg | ORAL_CAPSULE | Freq: Four times a day (QID) | ORAL | 0 refills | Status: DC | PRN

## 2017-04-22 MED ORDER — ONDANSETRON 4 MG PO TBDP
4.0000 mg | ORAL_TABLET | Freq: Once | ORAL | Status: AC | PRN
  Administered 2017-04-22: 4 mg via ORAL
  Filled 2017-04-22: qty 1

## 2017-04-22 NOTE — Discharge Instructions (Signed)
Please read and follow all provided instructions.  Your diagnoses today include:  1. Non-intractable vomiting with nausea, unspecified vomiting type   2. Diarrhea, unspecified type   3. Tinea versicolor     Tests performed today include: Blood counts and electrolytes Blood tests to check liver and kidney function Blood tests to check pancreas function Urine test to look for infection and pregnancy (in women) Vital signs. See below for your results today.   Medications prescribed:   Take any prescribed medications only as directed.  Zofran.  This is for nausea and vomiting.  Imodium.  This is to help make formed stools.  If your stools are deforming, you do not need to take it.  Terbinafine.  This is an antifungal cream.  Please apply twice daily to the areas of discoloration.  Home care instructions:  Follow any educational materials contained in this packet.  Your abdominal pain, nausea, vomiting, and diarrhea may be caused by a viral gastroenteritis also called 'stomach flu'. You should rest for the next several days. Keep drinking plenty of fluids and use the medicine for nausea as directed.   Drink clear liquids for the next 24 hours and introduce solid foods slowly after 24 hours using the b.r.a.t. diet (Bananas, Rice, Applesauce, Toast, Yogurt).    Follow-up instructions: Please follow-up with your primary care provider in the next 2 days for further evaluation of your symptoms. If you are not feeling better in 48 hours you may have a condition that is more serious and you need re-evaluation.   Return instructions:  SEEK IMMEDIATE MEDICAL ATTENTION IF: If you have pain that does not go away or becomes severe  A temperature above 101F develops  Repeated vomiting occurs (multiple episodes)  If you have pain that becomes localized to portions of the abdomen. The right side could possibly be appendicitis. In an adult, the left lower portion of the abdomen could be colitis or  diverticulitis.  Blood is being passed in stools or vomit (bright red or black tarry stools)  You develop chest pain, difficulty breathing, dizziness or fainting, or become confused, poorly responsive, or inconsolable (young children) If you have any other emergent concerns regarding your health  Additional Information: Abdominal (belly) pain can be caused by many things. Your caregiver performed an examination and possibly ordered blood/urine tests and imaging (CT scan, x-rays, ultrasound). Many cases can be observed and treated at home after initial evaluation in the emergency department. Even though you are being discharged home, abdominal pain can be unpredictable. Therefore, you need a repeated exam if your pain does not resolve, returns, or worsens. Most patients with abdominal pain don't have to be admitted to the hospital or have surgery, but serious problems like appendicitis and gallbladder attacks can start out as nonspecific pain. Many abdominal conditions cannot be diagnosed in one visit, so follow-up evaluations are very important.  Your vital signs today were: BP (!) 140/93    Pulse 88    Temp 98 F (36.7 C) (Oral)    Resp 18    Ht 5\' 1"  (1.549 m)    Wt 113.9 kg (251 lb)    LMP 03/22/2017    SpO2 95%    BMI 47.43 kg/m  If your blood pressure (bp) was elevated above 135/85 this visit, please have this repeated by your doctor within one month. --------------
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
## 2017-04-23 ENCOUNTER — Ambulatory Visit (INDEPENDENT_AMBULATORY_CARE_PROVIDER_SITE_OTHER): Payer: Medicaid Other | Admitting: Orthopaedic Surgery

## 2017-04-27 ENCOUNTER — Other Ambulatory Visit: Payer: Self-pay

## 2017-04-27 ENCOUNTER — Emergency Department (HOSPITAL_COMMUNITY): Payer: Medicaid Other

## 2017-04-27 ENCOUNTER — Emergency Department (HOSPITAL_COMMUNITY)
Admission: EM | Admit: 2017-04-27 | Discharge: 2017-04-27 | Disposition: A | Discharge status: Home / Self Care | Payer: Medicaid Other | Attending: Emergency Medicine | Admitting: Emergency Medicine

## 2017-04-27 ENCOUNTER — Encounter (HOSPITAL_COMMUNITY): Payer: Self-pay | Admitting: Emergency Medicine

## 2017-04-27 DIAGNOSIS — Y999 Unspecified external cause status: Secondary | ICD-10-CM | POA: Insufficient documentation

## 2017-04-27 DIAGNOSIS — I10 Essential (primary) hypertension: Secondary | ICD-10-CM | POA: Diagnosis not present

## 2017-04-27 DIAGNOSIS — Y939 Activity, unspecified: Secondary | ICD-10-CM | POA: Insufficient documentation

## 2017-04-27 DIAGNOSIS — F17210 Nicotine dependence, cigarettes, uncomplicated: Secondary | ICD-10-CM | POA: Diagnosis not present

## 2017-04-27 DIAGNOSIS — J45909 Unspecified asthma, uncomplicated: Secondary | ICD-10-CM | POA: Insufficient documentation

## 2017-04-27 DIAGNOSIS — Y929 Unspecified place or not applicable: Secondary | ICD-10-CM | POA: Diagnosis not present

## 2017-04-27 DIAGNOSIS — Z79899 Other long term (current) drug therapy: Secondary | ICD-10-CM | POA: Diagnosis not present

## 2017-04-27 DIAGNOSIS — S0083XA Contusion of other part of head, initial encounter: Secondary | ICD-10-CM | POA: Diagnosis present

## 2017-04-27 MED ORDER — HYDROCODONE-ACETAMINOPHEN 5-325 MG PO TABS
1.0000 | ORAL_TABLET | Freq: Once | ORAL | Status: AC
  Administered 2017-04-27: 1 via ORAL
  Filled 2017-04-27: qty 1

## 2017-04-27 MED ORDER — IBUPROFEN 800 MG PO TABS
800.0000 mg | ORAL_TABLET | Freq: Three times a day (TID) | ORAL | 0 refills | Status: DC
Start: 2017-04-27 — End: 2017-07-25

## 2017-04-27 MED ORDER — ONDANSETRON HCL 4 MG/2ML IJ SOLN
4.0000 mg | Freq: Once | INTRAMUSCULAR | Status: DC
  Filled 2017-04-27: qty 2

## 2017-04-27 MED ORDER — OXYCODONE-ACETAMINOPHEN 5-325 MG PO TABS: 2.0000 | ORAL_TABLET | ORAL | 0 refills | Status: DC | PRN
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
## 2017-04-27 NOTE — ED Notes (Signed)
Patient transported to CT 

## 2017-04-27 NOTE — ED Notes (Signed)
Tried calling PT from [place], No Answer

## 2017-04-27 NOTE — Discharge Instructions (Signed)
Use ice to the area frequently. Ibuprofen and Percocet for pain.
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
## 2017-05-09 ENCOUNTER — Encounter (HOSPITAL_COMMUNITY): Payer: Self-pay | Admitting: Emergency Medicine

## 2017-05-09 ENCOUNTER — Other Ambulatory Visit: Payer: Self-pay

## 2017-05-09 ENCOUNTER — Emergency Department (HOSPITAL_COMMUNITY)
Admission: EM | Admit: 2017-05-09 | Discharge: 2017-05-09 | Disposition: A | Discharge status: Home / Self Care | Payer: Medicaid Other | Attending: Emergency Medicine | Admitting: Emergency Medicine

## 2017-05-09 DIAGNOSIS — Y929 Unspecified place or not applicable: Secondary | ICD-10-CM | POA: Diagnosis not present

## 2017-05-09 DIAGNOSIS — Y999 Unspecified external cause status: Secondary | ICD-10-CM | POA: Insufficient documentation

## 2017-05-09 DIAGNOSIS — S01511A Laceration without foreign body of lip, initial encounter: Secondary | ICD-10-CM | POA: Diagnosis not present

## 2017-05-09 DIAGNOSIS — W010XXA Fall on same level from slipping, tripping and stumbling without subsequent striking against object, initial encounter: Secondary | ICD-10-CM | POA: Diagnosis not present

## 2017-05-09 DIAGNOSIS — S0993XA Unspecified injury of face, initial encounter: Secondary | ICD-10-CM | POA: Diagnosis present

## 2017-05-09 DIAGNOSIS — J45909 Unspecified asthma, uncomplicated: Secondary | ICD-10-CM | POA: Insufficient documentation

## 2017-05-09 DIAGNOSIS — I10 Essential (primary) hypertension: Secondary | ICD-10-CM | POA: Insufficient documentation

## 2017-05-09 DIAGNOSIS — Z79899 Other long term (current) drug therapy: Secondary | ICD-10-CM | POA: Diagnosis not present

## 2017-05-09 DIAGNOSIS — F17210 Nicotine dependence, cigarettes, uncomplicated: Secondary | ICD-10-CM | POA: Diagnosis not present

## 2017-05-09 DIAGNOSIS — Y9389 Activity, other specified: Secondary | ICD-10-CM | POA: Diagnosis not present

## 2017-05-09 MED ORDER — LIDOCAINE HCL (PF) 1 % IJ SOLN
5.0000 mL | Freq: Once | INTRAMUSCULAR | Status: AC
Start: 2017-05-09 — End: 2017-05-09
  Administered 2017-05-09: 5 mL via INTRADERMAL
  Filled 2017-05-09: qty 5

## 2017-05-09 NOTE — ED Notes (Signed)
E-signature pad unavailable. Patient verbalized understanding of d/c instructions

## 2017-05-09 NOTE — ED Triage Notes (Signed)
Pt reports "tripping over her pants" and busting open inside of lip. Happened at 9PM. Tetanus UTD

## 2017-05-09 NOTE — Discharge Instructions (Signed)
Can use ice on the lip for swelling.  Tylenol or motrin as needed for pain. Follow-up with your primary care doctor. Return here for any new/acute changes.

## 2017-05-09 NOTE — ED Triage Notes (Signed)
Patient states that she tripped and fell and cut the inside of her upper lip with her tooth. States Tdap is UTD.
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
## 2017-05-13 ENCOUNTER — Ambulatory Visit (INDEPENDENT_AMBULATORY_CARE_PROVIDER_SITE_OTHER): Payer: Medicaid Other | Admitting: Orthopaedic Surgery

## 2017-05-27 ENCOUNTER — Ambulatory Visit (INDEPENDENT_AMBULATORY_CARE_PROVIDER_SITE_OTHER): Payer: Medicaid Other | Admitting: Orthopaedic Surgery

## 2017-06-04 ENCOUNTER — Ambulatory Visit (INDEPENDENT_AMBULATORY_CARE_PROVIDER_SITE_OTHER): Payer: Medicaid Other | Admitting: Orthopaedic Surgery

## 2017-06-04 ENCOUNTER — Encounter (INDEPENDENT_AMBULATORY_CARE_PROVIDER_SITE_OTHER): Payer: Self-pay

## 2017-06-26 ENCOUNTER — Encounter (INDEPENDENT_AMBULATORY_CARE_PROVIDER_SITE_OTHER): Payer: Self-pay | Admitting: Physician Assistant

## 2017-06-26 ENCOUNTER — Ambulatory Visit (INDEPENDENT_AMBULATORY_CARE_PROVIDER_SITE_OTHER): Payer: Medicaid Other

## 2017-06-26 ENCOUNTER — Ambulatory Visit (INDEPENDENT_AMBULATORY_CARE_PROVIDER_SITE_OTHER): Payer: Medicaid Other | Admitting: Physician Assistant

## 2017-06-26 DIAGNOSIS — M25571 Pain in right ankle and joints of right foot: Secondary | ICD-10-CM
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
## 2017-07-25 ENCOUNTER — Other Ambulatory Visit: Payer: Self-pay

## 2017-07-25 ENCOUNTER — Emergency Department (HOSPITAL_COMMUNITY)
Admission: EM | Admit: 2017-07-25 | Discharge: 2017-07-25 | Disposition: A | Discharge status: Home / Self Care | Payer: Medicaid Other | Attending: Emergency Medicine | Admitting: Emergency Medicine

## 2017-07-25 ENCOUNTER — Emergency Department (HOSPITAL_COMMUNITY): Payer: Medicaid Other

## 2017-07-25 DIAGNOSIS — F17210 Nicotine dependence, cigarettes, uncomplicated: Secondary | ICD-10-CM | POA: Insufficient documentation

## 2017-07-25 DIAGNOSIS — F419 Anxiety disorder, unspecified: Secondary | ICD-10-CM | POA: Insufficient documentation

## 2017-07-25 DIAGNOSIS — J45909 Unspecified asthma, uncomplicated: Secondary | ICD-10-CM | POA: Insufficient documentation

## 2017-07-25 DIAGNOSIS — Z8673 Personal history of transient ischemic attack (TIA), and cerebral infarction without residual deficits: Secondary | ICD-10-CM | POA: Insufficient documentation

## 2017-07-25 DIAGNOSIS — M25561 Pain in right knee: Secondary | ICD-10-CM | POA: Diagnosis present

## 2017-07-25 DIAGNOSIS — F329 Major depressive disorder, single episode, unspecified: Secondary | ICD-10-CM | POA: Insufficient documentation

## 2017-07-25 DIAGNOSIS — I10 Essential (primary) hypertension: Secondary | ICD-10-CM | POA: Diagnosis not present

## 2017-07-25 MED ORDER — IBUPROFEN 800 MG PO TABS: 800.0000 mg | ORAL_TABLET | Freq: Three times a day (TID) | ORAL | 0 refills | Status: DC | PRN
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
## 2017-07-25 NOTE — ED Notes (Addendum)
Joseph ArtLinda Jenkins- [phone] and Keturah ShaversAustin Brogan361 [phone]. Currently has Pt's children.

## 2017-07-25 NOTE — ED Triage Notes (Signed)
[facility]- pt coming from home she was cleaning and has right knee pain. Pt stated her knees buckled. Some swelling to right knee noted. No deformity.

## 2017-07-25 NOTE — ED Notes (Signed)
Pt upset with how she is being treated by staff. Pt is calling friends in lobby and saying staff is mocking her. RN's and techs have only been kind and empathetic with Pt and acknowledged her needs. Pt upset with RN Earnest ConroyBrooke M; Pt states "that white bitch was rude"
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
## 2017-07-25 NOTE — ED Notes (Signed)
Pt verbalized understanding discharge instructions and denies any further needs or questions at this time.   Pt just told this RN " Fuck this shitty hospital ", I am getting all of you fired. " You dont fucking listen, you are fucking retarded"  Pt given crutches and refusing to let staff take vitals.    Pt given sock because she has not shoes.

## 2017-07-25 NOTE — Discharge Instructions (Signed)

## 2017-07-25 NOTE — ED Notes (Signed)
Pt states " All you white people are assholes and you cant be trusted"  Security made aware; asked pt to stop cursing.
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
## 2017-07-25 NOTE — ED Notes (Signed)
Pt upset

## 2017-09-09 ENCOUNTER — Telehealth: Payer: Self-pay | Admitting: Clinical

## 2017-09-09 NOTE — Telephone Encounter (Signed)
Error

## 2018-01-09 ENCOUNTER — Encounter (HOSPITAL_COMMUNITY): Payer: Self-pay

## 2018-01-09 ENCOUNTER — Ambulatory Visit (HOSPITAL_COMMUNITY)
Admission: EM | Admit: 2018-01-09 | Discharge: 2018-01-09 | Disposition: A | Discharge status: Home / Self Care | Payer: Medicaid Other | Attending: Family Medicine | Admitting: Family Medicine

## 2018-01-09 DIAGNOSIS — L03114 Cellulitis of left upper limb: Secondary | ICD-10-CM

## 2018-01-09 DIAGNOSIS — L03011 Cellulitis of right finger: Secondary | ICD-10-CM

## 2018-01-09 DIAGNOSIS — L0390 Cellulitis, unspecified: Secondary | ICD-10-CM

## 2018-01-09 DIAGNOSIS — L852 Keratosis punctata (palmaris et plantaris): Secondary | ICD-10-CM

## 2018-01-09 MED ORDER — DOXYCYCLINE HYCLATE 100 MG PO TABS
ORAL_TABLET | ORAL | Status: AC
  Filled 2018-01-09: qty 1

## 2018-01-09 MED ORDER — DOXYCYCLINE HYCLATE 100 MG PO TABS
100.0000 mg | ORAL_TABLET | Freq: Once | ORAL | Status: AC
  Administered 2018-01-09: 100 mg via ORAL

## 2018-01-09 MED ORDER — DOXYCYCLINE HYCLATE 100 MG PO CAPS: 100.0000 mg | ORAL_CAPSULE | Freq: Two times a day (BID) | ORAL | 0 refills | Status: DC

## 2018-01-09 NOTE — Discharge Instructions (Signed)
Warm soaks Take the antibiotic 2 x a day Return for problems
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
## 2018-01-09 NOTE — ED Triage Notes (Signed)
Pt presents with complaints of having "holes" in her fingers and has noticed some discoloration to her fingers. Complaints of throbbing pain and heat to touch x 2 weeks.
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
## 2018-08-06 ENCOUNTER — Encounter: Payer: Self-pay | Admitting: *Deleted

## 2018-08-17 ENCOUNTER — Emergency Department (HOSPITAL_COMMUNITY): Payer: Medicaid Other

## 2018-08-17 ENCOUNTER — Other Ambulatory Visit: Payer: Self-pay

## 2018-08-17 ENCOUNTER — Encounter (HOSPITAL_COMMUNITY): Payer: Self-pay | Admitting: Emergency Medicine

## 2018-08-17 ENCOUNTER — Emergency Department (HOSPITAL_COMMUNITY)
Admission: EM | Admit: 2018-08-17 | Discharge: 2018-08-17 | Disposition: A | Discharge status: Home / Self Care | Payer: Medicaid Other | Attending: Emergency Medicine | Admitting: Emergency Medicine

## 2018-08-17 DIAGNOSIS — R51 Headache: Secondary | ICD-10-CM | POA: Insufficient documentation

## 2018-08-17 DIAGNOSIS — F439 Reaction to severe stress, unspecified: Secondary | ICD-10-CM

## 2018-08-17 DIAGNOSIS — R079 Chest pain, unspecified: Secondary | ICD-10-CM | POA: Insufficient documentation

## 2018-08-17 DIAGNOSIS — J45909 Unspecified asthma, uncomplicated: Secondary | ICD-10-CM | POA: Insufficient documentation

## 2018-08-17 DIAGNOSIS — Z79899 Other long term (current) drug therapy: Secondary | ICD-10-CM | POA: Insufficient documentation

## 2018-08-17 DIAGNOSIS — Z6379 Other stressful life events affecting family and household: Secondary | ICD-10-CM | POA: Insufficient documentation

## 2018-08-17 DIAGNOSIS — F17210 Nicotine dependence, cigarettes, uncomplicated: Secondary | ICD-10-CM | POA: Insufficient documentation

## 2018-08-17 LAB — BASIC METABOLIC PANEL
Anion gap: 10 (ref 5–15)
BUN: 11 mg/dL (ref 6–20)
CO2: 23 mmol/L (ref 22–32)
Calcium: 9.3 mg/dL (ref 8.9–10.3)
Chloride: 108 mmol/L (ref 98–111)
Creatinine, Ser: 0.9 mg/dL (ref 0.44–1.00)
GFR calc Af Amer: >60 mL/min (ref >60)
GFR calc non Af Amer: >60 mL/min (ref >60)
Glucose, Bld: 109 mg/dL — ABNORMAL HIGH (ref 70–99)
Potassium: 3.6 mmol/L (ref 3.5–5.1)
Sodium: 141 mmol/L (ref 135–145)

## 2018-08-17 LAB — I-STAT BETA HCG BLOOD, ED (MC, WL, AP ONLY): I-stat hCG, quantitative: <5 m[IU]/mL (ref <5)

## 2018-08-17 LAB — CBC
HCT: 41.7 % (ref 36.0–46.0)
Hemoglobin: 13.4 g/dL (ref 12.0–15.0)
MCH: 27.2 pg (ref 26.0–34.0)
MCHC: 32.1 g/dL (ref 30.0–36.0)
MCV: 84.6 fL (ref 80.0–100.0)
Platelets: 281 10*3/uL (ref 150–400)
RBC: 4.93 MIL/uL (ref 3.87–5.11)
RDW: 13.2 % (ref 11.5–15.5)
WBC: 9.2 10*3/uL (ref 4.0–10.5)
nRBC: 0 % (ref 0.0–0.2)

## 2018-08-17 LAB — TROPONIN I: Troponin I: <0.03 ng/mL (ref <0.03)

## 2018-08-17 MED ORDER — SODIUM CHLORIDE 0.9% FLUSH
3.0000 mL | Freq: Once | INTRAVENOUS | Status: AC
  Administered 2018-08-17: 3 mL via INTRAVENOUS

## 2018-08-17 MED ORDER — SODIUM CHLORIDE 0.9 % IV BOLUS
1000.0000 mL | Freq: Once | INTRAVENOUS | Status: AC
  Administered 2018-08-17: 1000 mL via INTRAVENOUS

## 2018-08-17 MED ORDER — IBUPROFEN 800 MG PO TABS: 800.0000 mg | ORAL_TABLET | Freq: Three times a day (TID) | ORAL | 0 refills | Status: DC | PRN

## 2018-08-17 MED ORDER — KETOROLAC TROMETHAMINE 30 MG/ML IJ SOLN
30.0000 mg | Freq: Once | INTRAMUSCULAR | Status: AC
  Administered 2018-08-17: 19:00:00 30 mg via INTRAVENOUS
  Filled 2018-08-17: qty 1

## 2018-08-17 MED ORDER — ONDANSETRON 4 MG PO TBDP: 4.0000 mg | ORAL_TABLET | Freq: Three times a day (TID) | ORAL | 0 refills | Status: DC | PRN

## 2018-08-17 NOTE — ED Triage Notes (Addendum)
Pt to ED with c/o mid chest pain and pain under right breast x's 2 weeks.  Pt also c/o headache.  Pt st's the pain in her chest comes and goes.  Feels like her anxiety
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
## 2018-08-17 NOTE — Discharge Instructions (Addendum)
It was my pleasure taking care of you today!   Ibuprofen as needed for pain. Zofran as needed for nausea.   I would like you to call your primary care doctor in the morning to schedule a follow-up visit for your chest pain.  Primary care doctors are often great at managing anxiety and other mental health diseases as well.  It is totally fine to talk with your regular doctor about both of these issues at your follow-up appointment.  I have given you a referral to [facility] if you would like to speak with psychiatry instead.  You can call them to get well a follow-up appointment.  They also have certain times of the day each week that you can walk in.  Return to the ER for new or worsening symptoms or any additional concerns.

## 2018-08-18 MED FILL — IBUPROFEN 800 MG TAB: 800 | 7 days supply | Qty: 21 | Fill #0

## 2018-08-18 MED FILL — ONDANSETRON ODT 4 MG TABLET: 4 | 3 days supply | Qty: 10 | Fill #0

## 2019-03-03 ENCOUNTER — Emergency Department (HOSPITAL_COMMUNITY)
Admission: EM | Admit: 2019-03-03 | Discharge: 2019-03-03 | Disposition: A | Discharge status: Home / Self Care | Payer: Medicaid Other | Attending: Emergency Medicine | Admitting: Emergency Medicine

## 2019-03-03 ENCOUNTER — Encounter (HOSPITAL_COMMUNITY): Payer: Self-pay

## 2019-03-03 ENCOUNTER — Other Ambulatory Visit: Payer: Self-pay

## 2019-03-03 DIAGNOSIS — Z20828 Contact with and (suspected) exposure to other viral communicable diseases: Secondary | ICD-10-CM | POA: Diagnosis not present

## 2019-03-03 DIAGNOSIS — F17210 Nicotine dependence, cigarettes, uncomplicated: Secondary | ICD-10-CM | POA: Diagnosis not present

## 2019-03-03 DIAGNOSIS — J40 Bronchitis, not specified as acute or chronic: Secondary | ICD-10-CM

## 2019-03-03 DIAGNOSIS — Z79899 Other long term (current) drug therapy: Secondary | ICD-10-CM | POA: Diagnosis not present

## 2019-03-03 DIAGNOSIS — I10 Essential (primary) hypertension: Secondary | ICD-10-CM | POA: Insufficient documentation

## 2019-03-03 DIAGNOSIS — J029 Acute pharyngitis, unspecified: Secondary | ICD-10-CM | POA: Diagnosis present

## 2019-03-03 DIAGNOSIS — J45901 Unspecified asthma with (acute) exacerbation: Secondary | ICD-10-CM | POA: Diagnosis not present

## 2019-03-03 LAB — SARS CORONAVIRUS 2 (TAT 6-24 HRS): SARS Coronavirus 2: NEGATIVE

## 2019-03-03 MED ORDER — BENZONATATE 100 MG PO CAPS: 100.0000 mg | ORAL_CAPSULE | Freq: Three times a day (TID) | ORAL | 0 refills | Status: DC | PRN

## 2019-03-03 MED ORDER — PREDNISONE 20 MG PO TABS
60.0000 mg | ORAL_TABLET | Freq: Once | ORAL | Status: AC
  Administered 2019-03-03: 60 mg via ORAL
  Filled 2019-03-03: qty 3

## 2019-03-03 MED ORDER — ALBUTEROL SULFATE HFA 108 (90 BASE) MCG/ACT IN AERS
2.0000 | INHALATION_SPRAY | Freq: Once | RESPIRATORY_TRACT | Status: AC
  Administered 2019-03-03: 2 via RESPIRATORY_TRACT
  Filled 2019-03-03: qty 6.7

## 2019-03-03 MED ORDER — DIPHENHYDRAMINE HCL 25 MG PO TABS: 25.0000 mg | ORAL_TABLET | Freq: Four times a day (QID) | ORAL | 0 refills | Status: DC | PRN

## 2019-03-03 MED ORDER — PREDNISONE 20 MG PO TABS: ORAL_TABLET | ORAL | 0 refills | Status: DC

## 2019-03-03 MED ORDER — IBUPROFEN 800 MG PO TABS
800.0000 mg | ORAL_TABLET | Freq: Once | ORAL | Status: AC
  Administered 2019-03-03: 05:00:00 800 mg via ORAL
  Filled 2019-03-03: qty 1

## 2019-03-03 MED ORDER — BENZONATATE 100 MG PO CAPS
100.0000 mg | ORAL_CAPSULE | Freq: Once | ORAL | Status: AC
  Administered 2019-03-03: 100 mg via ORAL
  Filled 2019-03-03: qty 1

## 2019-03-03 MED ORDER — DIPHENHYDRAMINE HCL 25 MG PO CAPS
25.0000 mg | ORAL_CAPSULE | Freq: Once | ORAL | Status: AC
  Administered 2019-03-03: 25 mg via ORAL
  Filled 2019-03-03: qty 1

## 2019-03-03 MED FILL — BENZONATATE 100 MG CAPS: 100 | 7 days supply | Qty: 21 | Fill #0

## 2019-03-03 MED FILL — predniSONE 20 MG TABS: 20 | 4 days supply | Qty: 8 | Fill #0

## 2019-03-03 NOTE — ED Notes (Signed)
Pt verbalized discharge instructions and follow up care. Alert and ambulatory. No IV.  
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
## 2019-03-03 NOTE — ED Triage Notes (Signed)
Pt coming in c/o sore throat, body aches, shortness of breath and N/V that started a few days ago. Boyfriend is waiting on covid test to come back.

## 2019-04-06 MED FILL — ALBUTEROL SULFATE HFA 108 (: 108 (90 BAS | 16 days supply | Qty: 18 | Fill #0

## 2019-04-21 MED FILL — IBUPROFEN 800 MG TAB: 800 | 30 days supply | Qty: 90 | Fill #0

## 2019-04-21 MED FILL — ALBUTEROL SULFATE HFA 108 (: 108 (90 BAS | 16 days supply | Qty: 18 | Fill #0

## 2019-04-28 MED FILL — CYCLOBENZAPRINE HCL 10 MG T: 10 | 30 days supply | Qty: 90 | Fill #0

## 2019-04-28 MED FILL — MELOXICAM 15 MG TABLET: 15 | 30 days supply | Qty: 30 | Fill #0

## 2019-07-15 ENCOUNTER — Encounter: Payer: Self-pay | Admitting: Medical

## 2019-07-15 ENCOUNTER — Other Ambulatory Visit: Payer: Self-pay

## 2019-07-15 ENCOUNTER — Ambulatory Visit (INDEPENDENT_AMBULATORY_CARE_PROVIDER_SITE_OTHER): Payer: Medicaid Other | Admitting: Medical

## 2019-07-15 VITALS — BP 118/81 | HR 105 | Wt 275.7 lb

## 2019-07-15 DIAGNOSIS — Z30430 Encounter for insertion of intrauterine contraceptive device: Secondary | ICD-10-CM | POA: Diagnosis not present

## 2019-07-15 DIAGNOSIS — Z3046 Encounter for surveillance of implantable subdermal contraceptive: Secondary | ICD-10-CM

## 2019-07-15 MED ORDER — LEVONORGESTREL 19.5 MCG/DAY IU IUD
INTRAUTERINE_SYSTEM | Freq: Once | INTRAUTERINE | Status: AC
  Administered 2019-07-15: 1 via INTRAUTERINE

## 2019-07-15 NOTE — Progress Notes (Signed)
PHQ-9 and GAD-7 positive. Pt currently in counseling. Pt states she is satisfied with her care and denies [facility] services with our office at this time.   Fleet Contras RN 07/15/19

## 2019-07-15 NOTE — Patient Instructions (Signed)
Levonorgestrel intrauterine device (IUD) What is this medicine? LEVONORGESTREL IUD (LEE voe nor jes trel) is a contraceptive (birth control) device. The device is placed inside the uterus by a healthcare professional. It is used to prevent pregnancy. This device can also be used to treat heavy bleeding that occurs during your period. This medicine may be used for other purposes; ask your health care provider or pharmacist if you have questions. COMMON BRAND NAME(S): Kyleena, LILETTA, Mirena, Skyla What should I tell my health care provider before I take this medicine? They need to know if you have any of these conditions:  abnormal Pap smear  cancer of the breast, uterus, or cervix  diabetes  endometritis  genital or pelvic infection now or in the past  have more than one sexual partner or your partner has more than one partner  heart disease  history of an ectopic or tubal pregnancy  immune system problems  IUD in place  liver disease or tumor  problems with blood clots or take blood-thinners  seizures  use intravenous drugs  uterus of unusual shape  vaginal bleeding that has not been explained  an unusual or allergic reaction to levonorgestrel, other hormones, silicone, or polyethylene, medicines, foods, dyes, or preservatives  pregnant or trying to get pregnant  breast-feeding How should I use this medicine? This device is placed inside the uterus by a health care professional. Talk to your pediatrician regarding the use of this medicine in children. Special care may be needed. Overdosage: If you think you have taken too much of this medicine contact a poison control center or emergency room at once. NOTE: This medicine is only for you. Do not share this medicine with others. What if I miss a dose? This does not apply. Depending on the brand of device you have inserted, the device will need to be replaced every 3 to 6 years if you wish to continue using this type  of birth control. What may interact with this medicine? Do not take this medicine with any of the following medications:  amprenavir  bosentan  fosamprenavir This medicine may also interact with the following medications:  aprepitant  armodafinil  barbiturate medicines for inducing sleep or treating seizures  bexarotene  boceprevir  griseofulvin  medicines to treat seizures like carbamazepine, ethotoin, felbamate, oxcarbazepine, phenytoin, topiramate  modafinil  pioglitazone  rifabutin  rifampin  rifapentine  some medicines to treat HIV infection like atazanavir, efavirenz, indinavir, lopinavir, nelfinavir, tipranavir, ritonavir  St. John's wort  warfarin This list may not describe all possible interactions. Give your health care provider a list of all the medicines, herbs, non-prescription drugs, or dietary supplements you use. Also tell them if you smoke, drink alcohol, or use illegal drugs. Some items may interact with your medicine. What should I watch for while using this medicine? Visit your doctor or health care professional for regular check ups. See your doctor if you or your partner has sexual contact with others, becomes HIV positive, or gets a sexual transmitted disease. This product does not protect you against HIV infection (AIDS) or other sexually transmitted diseases. You can check the placement of the IUD yourself by reaching up to the top of your vagina with clean fingers to feel the threads. Do not pull on the threads. It is a good habit to check placement after each menstrual period. Call your doctor right away if you feel more of the IUD than just the threads or if you cannot feel the threads at   all. The IUD may come out by itself. You may become pregnant if the device comes out. If you notice that the IUD has come out use a backup birth control method like condoms and call your health care provider. Using tampons will not change the position of the  IUD and are okay to use during your period. This IUD can be safely scanned with magnetic resonance imaging (MRI) only under specific conditions. Before you have an MRI, tell your healthcare provider that you have an IUD in place, and which type of IUD you have in place. What side effects may I notice from receiving this medicine? Side effects that you should report to your doctor or health care professional as soon as possible:  allergic reactions like skin rash, itching or hives, swelling of the face, lips, or tongue  fever, flu-like symptoms  genital sores  high blood pressure  no menstrual period for 6 weeks during use  pain, swelling, warmth in the leg  pelvic pain or tenderness  severe or sudden headache  signs of pregnancy  stomach cramping  sudden shortness of breath  trouble with balance, talking, or walking  unusual vaginal bleeding, discharge  yellowing of the eyes or skin Side effects that usually do not require medical attention (report to your doctor or health care professional if they continue or are bothersome):  acne  breast pain  change in sex drive or performance  changes in weight  cramping, dizziness, or faintness while the device is being inserted  headache  irregular menstrual bleeding within first 3 to 6 months of use  nausea This list may not describe all possible side effects. Call your doctor for medical advice about side effects. You may report side effects to FDA at 1-800-FDA-1088. Where should I keep my medicine? This does not apply. NOTE: This sheet is a summary. It may not cover all possible information. If you have questions about this medicine, talk to your doctor, pharmacist, or health care provider.  2020 Elsevier/Gold Standard (2018-02-03 13:22:01) IUD PLACEMENT POST-PROCEDURE INSTRUCTIONS  1. You may take Ibuprofen, Aleve or Tylenol for pain if needed.  Cramping should resolve within in 24 hours.  2. You may have a small  amount of spotting.  You should wear a mini pad for the next few days.  3. You may have intercourse after 24 hours.  If you using this for birth control, it is effective immediately.  4. You need to call if you have any pelvic pain, fever, heavy bleeding or foul smelling vaginal discharge.  Irregular bleeding is common the first several months after having an IUD placed. You do not need to call for this reason unless you are concerned.  5. Shower or bathe as normal  6. You should have a follow-up appointment in 4-8 weeks for a re-check to make sure you are not having any problems. 

## 2019-07-15 NOTE — Addendum Note (Signed)
Addended by: Marjo Bicker on: 07/15/2019 05:45 PM   Modules accepted: Orders

## 2019-07-15 NOTE — Progress Notes (Signed)
  GYNECOLOGY CLINIC PROCEDURE NOTE  Ms. Kemba Hoppes is a 29 y.o. [phone] here for Nexplanon removal due to irregular bleeding. Nexplanon placed 12/31/2016 after the birth of her last child. No GYN concerns.    Nexplanon Removal Patient was given informed consent for removal of her Nexplanon.  Appropriate time out taken. Nexplanon site identified.  Area prepped in usual sterile fashon. One ml of 1% lidocaine was used to anesthetize the area at the distal end of the implant. A small stab incision was made right beside the implant on the distal portion.  The Nexplanon rod was grasped using hemostats and removed without difficulty.  There was minimal blood loss. There were no complications.  A small amount of antibiotic ointment and steri-strips were applied over the small incision.  A pressure bandage was applied to reduce any bruising.  The patient tolerated the procedure well and was given post procedure instructions.  Patient is planning to use IUD for contraception.  IUD Insertion Procedure Note Patient identified, informed consent performed.  Discussed risks of irregular bleeding, cramping, infection, malpositioning or misplacement of the IUD outside the uterus which may require further procedure such as laparoscopy. Time out was performed.  Urine pregnancy test negative.  Speculum placed in the vagina.  Cervix visualized.  Cleaned with Betadine x 2.  Grasped anteriorly with a single tooth tenaculum.  Uterus sounded to 7 cm.  Mirena IUD placed per manufacturer's recommendations.  Strings trimmed to 3 cm. Tenaculum was removed, good hemostasis noted.  Patient tolerated procedure well.   Patient was given post-procedure instructions.  She was advised to be have backup contraception for one week.  Patient was also asked to check IUD strings periodically and follow up in 4 weeks for IUD check.  Marny Lowenstein, PA-C 07/15/2019 5:04 PM

## 2019-08-02 ENCOUNTER — Encounter: Payer: Self-pay | Admitting: *Deleted

## 2019-08-18 ENCOUNTER — Other Ambulatory Visit: Payer: Self-pay

## 2019-08-18 ENCOUNTER — Ambulatory Visit (INDEPENDENT_AMBULATORY_CARE_PROVIDER_SITE_OTHER): Payer: Medicaid Other

## 2019-08-18 ENCOUNTER — Ambulatory Visit (INDEPENDENT_AMBULATORY_CARE_PROVIDER_SITE_OTHER): Payer: Medicaid Other | Admitting: Orthopaedic Surgery

## 2019-08-18 DIAGNOSIS — M25561 Pain in right knee: Secondary | ICD-10-CM | POA: Diagnosis not present
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
## 2019-08-20 ENCOUNTER — Encounter: Payer: Self-pay | Admitting: Medical

## 2019-08-20 ENCOUNTER — Ambulatory Visit (INDEPENDENT_AMBULATORY_CARE_PROVIDER_SITE_OTHER): Payer: Medicaid Other | Admitting: Medical

## 2019-08-20 ENCOUNTER — Other Ambulatory Visit: Payer: Self-pay

## 2019-08-20 VITALS — BP 124/87 | HR 85 | Ht 61.0 in | Wt 274.2 lb

## 2019-08-20 DIAGNOSIS — L910 Hypertrophic scar: Secondary | ICD-10-CM

## 2019-08-20 DIAGNOSIS — Z30431 Encounter for routine checking of intrauterine contraceptive device: Secondary | ICD-10-CM | POA: Diagnosis not present

## 2019-08-20 NOTE — Patient Instructions (Signed)

## 2019-08-20 NOTE — Progress Notes (Signed)
  History:  [name] is a 29 y.o. [id number] who presents to clinic today for IUD check. The patient had IUD placed 4/8/21after Nexplanon removal at same visit. The Nexplanon removal was difficult and the patient has a small scar at the incision site. She is concerned about this because she has not been allowed to donate plasma due to the scarring. She had bleeding x 1 week after insertion of the IUD and then ~ 3 days of lighter bleeding more recently. She states cramping associated with the bleeding as well. She has been able to feel the strings.    The following portions of the patient's history were reviewed and updated as appropriate: allergies, current medications, family history, past medical history, social history, past surgical history and problem list.  Review of Systems:  Review of Systems  Constitutional: Negative for chills and fever.  Gastrointestinal: Negative for abdominal pain.  Genitourinary:       Neg - vaginal bleeding, discharge      Objective:  Physical Exam BP 124/87   Pulse 85   Ht 5\' 1"  (1.549 m)   Wt 274 lb 3.2 oz (124.4 kg)   BMI 51.81 kg/m  Physical Exam  Nursing note and vitals reviewed. Constitutional: She is oriented to person, place, and time. She appears well-developed and well-nourished. No distress.  HENT:  Head: Normocephalic and atraumatic.  Cardiovascular: Normal rate.  Respiratory: Effort normal.  GI: Soft. She exhibits no distension.  Genitourinary: Cervix exhibits no friability.    No vaginal discharge or bleeding.  No bleeding in the vagina.    Genitourinary Comments: IUD strings are visualized and appear appropriate length   Neurological: She is alert and oriented to person, place, and time.  Skin: Skin is warm and dry. No erythema.  Psychiatric: She has a normal mood and affect.    Assessment & Plan:  1. IUD check up - IUD in place - Patient to continue to monitor bleeding pattern and report significant increase in bleeding or  pain  - If bleeding pattern worsens consider for placement   2. Scar - Dr. [place] consulted and recommends steroid injection for scar to reduce keloid - Triamcinolone will be ordered today and patient will return in 2 weeks for injection  - Letter given in the meantime for Plasma donation stating that scarring is from removal of birth control and should not impact her ability to donate  Approximately 15 minutes total time was spent with this patient discussing progress since last visit, concerns about birth control and scarring, and developing a plan for follow-up.   Adrian Blackwater, PA-C 08/20/2019 11:49 AM

## 2019-08-30 ENCOUNTER — Ambulatory Visit: Payer: Medicaid Other | Admitting: Family Medicine

## 2019-09-01 ENCOUNTER — Ambulatory Visit: Payer: Medicaid Other | Admitting: Orthopaedic Surgery

## 2019-09-12 ENCOUNTER — Encounter: Payer: Self-pay | Admitting: Orthopaedic Surgery

## 2019-09-12 ENCOUNTER — Ambulatory Visit
Admission: RE | Admit: 2019-09-12 | Discharge: 2019-09-12 | Disposition: A | Discharge status: Home / Self Care | Payer: Medicaid Other | Source: Ambulatory Visit | Attending: Orthopaedic Surgery | Admitting: Orthopaedic Surgery

## 2019-09-12 DIAGNOSIS — M25561 Pain in right knee: Secondary | ICD-10-CM

## 2019-09-13 ENCOUNTER — Ambulatory Visit: Payer: Medicaid Other | Admitting: Family Medicine

## 2019-09-20 ENCOUNTER — Other Ambulatory Visit: Payer: Medicaid Other

## 2019-09-21 ENCOUNTER — Encounter: Payer: Self-pay | Admitting: Physician Assistant

## 2019-09-21 ENCOUNTER — Ambulatory Visit (INDEPENDENT_AMBULATORY_CARE_PROVIDER_SITE_OTHER): Payer: Medicaid Other | Admitting: Physician Assistant

## 2019-09-21 ENCOUNTER — Other Ambulatory Visit: Payer: Self-pay

## 2019-09-21 DIAGNOSIS — M25561 Pain in right knee: Secondary | ICD-10-CM
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
## 2019-09-22 ENCOUNTER — Other Ambulatory Visit: Payer: Self-pay | Admitting: Radiology

## 2019-09-22 DIAGNOSIS — M25561 Pain in right knee: Secondary | ICD-10-CM

## 2019-09-29 ENCOUNTER — Other Ambulatory Visit: Payer: Self-pay

## 2019-09-29 ENCOUNTER — Ambulatory Visit (INDEPENDENT_AMBULATORY_CARE_PROVIDER_SITE_OTHER): Payer: Medicaid Other | Admitting: Orthopedic Surgery

## 2019-09-29 ENCOUNTER — Encounter: Payer: Self-pay | Admitting: Orthopedic Surgery

## 2019-09-29 DIAGNOSIS — S83511D Sprain of anterior cruciate ligament of right knee, subsequent encounter: Secondary | ICD-10-CM
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
## 2019-10-12 NOTE — Progress Notes (Signed)
[facility] - [place], [place] - [address] [place] [address] [place] [address] Phone: [phone] Fax: [phone]    Your procedure is scheduled on Thursday, July15th  Report to [facility] Main Entrance "A" at 10:15 A.M., and check in at the Admitting office.  Call this number if you have problems the morning of surgery:  [phone]  Call [phone] if you have any questions prior to your surgery date Monday-Friday 8am-4pm   Remember:  Do not eat or drink after midnight the night before your surgery   Take these medicines the morning of surgery with A SIP OF WATER: NONE   If needed -  albuterol (PROVENTIL HFA;VENTOLIN HFA)/inhaler -  Bring with you the day of the procedure   As of today, STOP taking any Aspirin (unless otherwise instructed by your surgeon) Aleve, Naproxen, Ibuprofen, Motrin, Advil, Goody's, BC's, all herbal medications, fish oil, and all vitamins.             Do not wear jewelry, make up, or nail polish            Do not wear lotions, powders, perfumes, or deodorant.            Do not shave 48 hours prior to surgery.             Do not bring valuables to the hospital.            [facility] is not responsible for any belongings or valuables.  Do NOT Smoke (Tobacco/Vaping) or drink Alcohol 24 hours prior to your procedure If you use a CPAP at night, you may bring all equipment for your overnight stay.   Contacts, glasses, dentures or bridgework may not be worn into surgery.      For patients admitted to the hospital, discharge time will be determined by your treatment team.   Patients discharged the day of surgery will not be allowed to drive home, and someone needs to stay with them for 24 hours.  Special instructions:   [place]- Preparing For Surgery  Before surgery, you can play an important role. Because skin is not sterile, your skin needs to be as free of germs as possible. You can reduce the number of  germs on your skin by washing with CHG (chlorahexidine gluconate) Soap before surgery.  CHG is an antiseptic cleaner which kills germs and bonds with the skin to continue killing germs even after washing.    Oral Hygiene is also important to reduce your risk of infection.  Remember - BRUSH YOUR TEETH THE MORNING OF SURGERY WITH YOUR REGULAR TOOTHPASTE  Please do not use if you have an allergy to CHG or antibacterial soaps. If your skin becomes reddened/irritated stop using the CHG.  Do not shave (including legs and underarms) for at least 48 hours prior to first CHG shower. It is OK to shave your face.  Please follow these instructions carefully.   1. Shower the NIGHT BEFORE SURGERY and the MORNING OF SURGERY with CHG Soap.   2. If you chose to wash your hair, wash your hair first as usual with your normal shampoo.  3. After you shampoo, rinse your hair and body thoroughly to remove the shampoo.  4. Use CHG as you would any other liquid soap. You can apply CHG directly to the skin and wash gently with a scrungie or a clean washcloth.   5. Apply the CHG Soap to your body ONLY FROM THE  NECK DOWN.  Do not use on open wounds or open sores. Avoid contact with your eyes, ears, mouth and genitals (private parts). Wash Face and genitals (private parts)  with your normal soap.   6. Wash thoroughly, paying special attention to the area where your surgery will be performed.  7. Thoroughly rinse your body with warm water from the neck down.  8. DO NOT shower/wash with your normal soap after using and rinsing off the CHG Soap.  9. Pat yourself dry with a CLEAN TOWEL.  10. Wear CLEAN PAJAMAS to bed the night before surgery  11. Place CLEAN SHEETS on your bed the night of your first shower and DO NOT SLEEP WITH PETS.  Day of Surgery: Wear Clean/Comfortable clothing the morning of surgery Do not apply any deodorants/lotions.   Remember to brush your teeth WITH YOUR REGULAR TOOTHPASTE.   Please  read over the following fact sheets that you were given.

## 2019-10-13 ENCOUNTER — Other Ambulatory Visit: Payer: Self-pay

## 2019-10-13 ENCOUNTER — Encounter (HOSPITAL_COMMUNITY): Payer: Self-pay

## 2019-10-13 ENCOUNTER — Encounter (HOSPITAL_COMMUNITY)
Admission: RE | Admit: 2019-10-13 | Discharge: 2019-10-13 | Disposition: A | Discharge status: Home / Self Care | Payer: Medicaid Other | Source: Ambulatory Visit | Attending: Orthopedic Surgery | Admitting: Orthopedic Surgery

## 2019-10-13 DIAGNOSIS — Z01818 Encounter for other preprocedural examination: Secondary | ICD-10-CM | POA: Diagnosis present

## 2019-10-13 HISTORY — DX: Bipolar disorder, unspecified: F31.9

## 2019-10-13 HISTORY — DX: Dyspnea, unspecified: R06.00

## 2019-10-13 HISTORY — DX: Headache, unspecified: R51.9

## 2019-10-13 LAB — CBC
HCT: 39.8 % (ref 36.0–46.0)
Hemoglobin: 12.7 g/dL (ref 12.0–15.0)
MCH: 27.9 pg (ref 26.0–34.0)
MCHC: 31.9 g/dL (ref 30.0–36.0)
MCV: 87.3 fL (ref 80.0–100.0)
Platelets: 300 10*3/uL (ref 150–400)
RBC: 4.56 MIL/uL (ref 3.87–5.11)
RDW: 13 % (ref 11.5–15.5)
WBC: 6.7 10*3/uL (ref 4.0–10.5)
nRBC: 0 % (ref 0.0–0.2)

## 2019-10-13 LAB — COMPREHENSIVE METABOLIC PANEL
ALT: 29 U/L (ref 0–44)
AST: 18 U/L (ref 15–41)
Albumin: 3.6 g/dL (ref 3.5–5.0)
Alkaline Phosphatase: 77 U/L (ref 38–126)
Anion gap: 8 (ref 5–15)
BUN: 7 mg/dL (ref 6–20)
CO2: 24 mmol/L (ref 22–32)
Calcium: 9 mg/dL (ref 8.9–10.3)
Chloride: 108 mmol/L (ref 98–111)
Creatinine, Ser: 1.1 mg/dL — ABNORMAL HIGH (ref 0.44–1.00)
GFR calc Af Amer: >60 mL/min (ref >60)
GFR calc non Af Amer: >60 mL/min (ref >60)
Glucose, Bld: 98 mg/dL (ref 70–99)
Potassium: 3.9 mmol/L (ref 3.5–5.1)
Sodium: 140 mmol/L (ref 135–145)
Total Bilirubin: 0.2 mg/dL — ABNORMAL LOW (ref 0.3–1.2)
Total Protein: 6.8 g/dL (ref 6.5–8.1)

## 2019-10-13 LAB — SURGICAL PCR SCREEN
MRSA, PCR: NEGATIVE
Staphylococcus aureus: NEGATIVE

## 2019-10-13 NOTE — Progress Notes (Signed)
Elevated BP during PAT appointment:  DBP elevated as below. Per patient, she was prescribed hydrochlorothiazide (HYDRODIURIL) 12.5 MG tablet daily back in 2017 but never took it. Antionette Poles, PA-C notified and assessed patient during PAT appointment.   10/13/19 0852 10/13/19 0945 10/13/19 1020  Vitals  Temp 98.9 F (37.2 C)  --   --   Temp Source Oral  --   --   Pulse Rate 79  --   --   Pulse Rate Source Monitor  --   --   Resp 18  --   --   Respiratory Pattern Regular  --   --   BP (!) 134/101 (!) 146/103 (!) 146/110  SpO2 100 %  --   --   Height and Weight  Height 5\' 1"  (1.549 m)  --   --   Height Method Stated  --   --   Weight 127.8 kg  --   --   Weight Method Actual  --   --   BMI (Calculated) 53.27  --   --   BSA (Calculated - sq m) 2.35 sq meters  --   --   Pain Assessment  Pain Scale  --  0-10  --   Pain Score  --  8  --   Pain Type  --  Acute pain  --   Pain Location  --  Knee  --   Pain Orientation  --  Right  --   Pain Descriptors / Indicators  --  Aching;Burning;Constant;Throbbing;Sharp;Shooting;Crushing;Jabbing;Discomfort  --   Patients Stated Pain Goal  --  6  --   Pain Intervention(s)  --  RN made aware  --   LOC  Level of Consciousness  --  Alert  --   Orientation Level  --  Oriented X4  --

## 2019-10-13 NOTE — Progress Notes (Signed)
Your procedure is scheduled on Thursday, July15th  Report to [facility] Main Entrance "A" at 10:15 A.M., and check in at the Admitting office.  Call this number if you have problems the morning of surgery:  [phone]  Call [phone] if you have any questions prior to your surgery date Monday-Friday 8am-4pm   Remember:  Do not eat after midnight the night before your surgery.  You may drink clear liquids until 09:15 AM the morning of your surgery. Clear liquids allowed are: Water, Non-Citrus Juices (without pulp), Carbonated Beverages, Clear Tea, Black Coffee Only, and Gatorade.   Enhanced Recovery after Surgery for Orthopedics Enhanced Recovery after Surgery is a protocol used to improve the stress on your body and your recovery after surgery.  Patient Instructions  . The night before surgery:  o No food after midnight. ONLY clear liquids after midnight  .  Marland Kitchen The day of surgery (if you do NOT have diabetes):  o Drink ONE (1) Pre-Surgery Clear Ensure by 09:15 AM the morning of surgery .  o This drink was given to you during your hospital  pre-op appointment visit. o Nothing else to drink after completing the  Pre-Surgery Clear Ensure.         If you have questions, please contact your surgeon's office.    Take these medicines the morning of surgery with A SIP OF WATER: NONE   If needed -  albuterol (PROVENTIL HFA;VENTOLIN HFA)/inhaler -  Bring with you the day of the procedure    As of today, STOP taking any Aspirin (unless otherwise instructed by your surgeon) Aleve, Naproxen, Ibuprofen, Motrin, Advil, Goody's, BC's, all herbal medications, fish oil, and all vitamins.  The Morning of Surgery:            Do not wear jewelry, make up, or nail polish            Do not wear lotions, powders, perfumes, or deodorant.            Do not shave 48 hours prior to surgery.             Do not bring valuables to the hospital.            [facility] is not responsible for any  belongings or valuables.  Do NOT Smoke (Tobacco/Vaping) or drink Alcohol 24 hours prior to your procedure If you use a CPAP at night, you may bring all equipment for your overnight stay.   Contacts, glasses, dentures or bridgework may not be worn into surgery.      For patients admitted to the hospital, discharge time will be determined by your treatment team.   Patients discharged the day of surgery will not be allowed to drive home, and someone needs to stay with them for 24 hours.  Special instructions:   [place]- Preparing For Surgery  Before surgery, you can play an important role. Because skin is not sterile, your skin needs to be as free of germs as possible. You can reduce the number of germs on your skin by washing with CHG (chlorahexidine gluconate) Soap before surgery.  CHG is an antiseptic cleaner which kills germs and bonds with the skin to continue killing germs even after washing.    Oral Hygiene is also important to reduce your risk of infection.  Remember - BRUSH YOUR TEETH THE MORNING OF SURGERY WITH YOUR REGULAR TOOTHPASTE  Please do not use if you have an allergy to CHG or antibacterial soaps.  If your skin becomes reddened/irritated stop using the CHG.  Do not shave (including legs and underarms) for at least 48 hours prior to first CHG shower. It is OK to shave your face.  Please follow these instructions carefully.   1. Shower the NIGHT BEFORE SURGERY and the MORNING OF SURGERY with CHG Soap.   2. If you chose to wash your hair, wash your hair first as usual with your normal shampoo.  3. After you shampoo, rinse your hair and body thoroughly to remove the shampoo.  4. Use CHG as you would any other liquid soap. You can apply CHG directly to the skin and wash gently with a scrungie or a clean washcloth.   5. Apply the CHG Soap to your body ONLY FROM THE NECK DOWN.  Do not use on open wounds or open sores. Avoid contact with your eyes, ears, mouth and genitals  (private parts). Wash Face and genitals (private parts)  with your normal soap.   6. Wash thoroughly, paying special attention to the area where your surgery will be performed.  7. Thoroughly rinse your body with warm water from the neck down.  8. DO NOT shower/wash with your normal soap after using and rinsing off the CHG Soap.  9. Pat yourself dry with a CLEAN TOWEL.  10. Wear CLEAN PAJAMAS to bed the night before surgery  11. Place CLEAN SHEETS on your bed the night of your first shower and DO NOT SLEEP WITH PETS.  Day of Surgery: Wear Clean/Comfortable clothing the morning of surgery Do not apply any deodorants/lotions.   Remember to brush your teeth WITH YOUR REGULAR TOOTHPASTE.   Please read over the following fact sheets that you were given.

## 2019-10-13 NOTE — Progress Notes (Signed)
PCP - Leilani Able, MD with [facility]. Cardiologist - Denies  PPM/ICD - Denies  Chest x-ray - N/A EKG - 10/13/19 Stress Test - Denies ECHO - Denies Cardiac Cath - Denies  Sleep Study - Denies   Patient denies being diabetic.  Blood Thinner Instructions: N/A Aspirin Instructions: N/A  ERAS Protcol - Yes PRE-SURGERY Ensure or G2- Yes  COVID TEST- 10/18/19   Anesthesia review: Yes, Elevated DBP during PAT appointment.  Patient denies shortness of breath, fever, cough and chest pain at PAT appointment   All instructions explained to the patient, with a verbal understanding of the material. Patient agrees to go over the instructions while at home for a better understanding. Patient also instructed to self quarantine after being tested for COVID-19. The opportunity to ask questions was provided.

## 2019-10-14 MED FILL — METOPROLOL TARTRATE 50 MG T: 50 | 30 days supply | Qty: 30 | Fill #0

## 2019-10-14 MED FILL — PROAIR HFA 90 MCG INHALER: 108 (90 BAS | 16 days supply | Qty: 9 | Fill #0

## 2019-10-14 NOTE — Progress Notes (Signed)
Anesthesia Chart Review:  Patient noted to have uncontrolled hypertension at PAT appointment.  Pressure on arrival 134/101, recheck 146/103 and 146/110.  She reports history of diastolic hypertension and has previously been prescribed hydrochlorothiazide but elected not to take.  We discussed the importance of hypertension control and the fact that uncontrolled hypertension can be caused for day of surgery cancellation.  She verbalized understanding.  She was instructed to call her primary care physician Dr. Leilani Able for further guidance and said that she would do so today.  I followed up with pt via phone on 10/15/19 and she reported she was able to see her PCP Dr. Pecola Leisure and was started on metoprolol 50mg  Qday. Her BP at that appt was reportedly 164/90. She does not have a BP cuff at home. She will watch her diet leading up to surgery, she admits to high sodium diet and will reduce this in an effort to optimize BP.   Preop labs reviewed, unremarkable.   EKG 10/13/19: Sinus rhythm with marked sinus arrhythmia. Rate 65. Low voltage limb leads   12/14/19 [facility] Short Stay Center/Anesthesiology Phone [phone] 10/15/2019 3:45 PM

## 2019-10-15 ENCOUNTER — Telehealth: Payer: Self-pay
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
## 2019-10-15 NOTE — Telephone Encounter (Signed)
Patient called in wanting to see about getting a scooter so she will be able to get into her home after her upcoming surgery.

## 2019-10-15 NOTE — Telephone Encounter (Signed)
Probably would benefit more from wheelchair with eventual transition to crutches or knee scooter.  It will be difficult to flex her knee immediately after surgery so using the scooter will be difficult.

## 2019-10-15 NOTE — Telephone Encounter (Signed)
Patient will pick up Monday morning rx for wheelchair

## 2019-10-15 NOTE — Progress Notes (Signed)
Patient called to let us know that she is now taking metoprolol 50 mg qd for HTN.  Patient advised to take DOS with sip of water.  Patient verbalized understanding.

## 2019-10-15 NOTE — Telephone Encounter (Signed)
Ok for this? 

## 2019-10-18 ENCOUNTER — Telehealth: Payer: Self-pay | Admitting: Orthopedic Surgery

## 2019-10-18 ENCOUNTER — Other Ambulatory Visit (HOSPITAL_COMMUNITY)
Admission: RE | Admit: 2019-10-18 | Discharge: 2019-10-18 | Disposition: A | Discharge status: Home / Self Care | Payer: Medicaid Other | Source: Ambulatory Visit | Attending: Orthopedic Surgery | Admitting: Orthopedic Surgery

## 2019-10-18 DIAGNOSIS — Z20822 Contact with and (suspected) exposure to covid-19: Secondary | ICD-10-CM | POA: Diagnosis not present

## 2019-10-18 DIAGNOSIS — Z01812 Encounter for preprocedural laboratory examination: Secondary | ICD-10-CM | POA: Insufficient documentation

## 2019-10-18 LAB — SARS CORONAVIRUS 2 (TAT 6-24 HRS): SARS Coronavirus 2: NEGATIVE

## 2019-10-18 NOTE — Telephone Encounter (Signed)
Patient is requesting another hinged knee brace (size XL).  She is currently at [facility] and was not aware she would have to pay in full for the medical equipment.  She is leaving with nothing.  Patient would like to know what her other options are.  She is in need of rolling walker with seat and still researching options for the skooter because of the hill she has to tackle and need for mobility as a parent.  Please call patient to let her know when she is able to pick up XL hinged knee brace.    cb  [phone]
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
## 2019-10-18 NOTE — Telephone Encounter (Signed)
Cpm immediately post op or day after  - I am going to prefer that she be nwb for first several weeks so crutches better for that

## 2019-10-18 NOTE — Telephone Encounter (Signed)
I have talked with both Kipp Brood and patient. Will get her set up with walker.

## 2019-10-19 ENCOUNTER — Telehealth: Payer: Self-pay | Admitting: Orthopedic Surgery

## 2019-10-19 NOTE — Telephone Encounter (Signed)
Spoke with Kipp Brood. They have tried to reach patient multiple times. They will reach out to her again. I have advised patient to advise of this.

## 2019-10-19 NOTE — Telephone Encounter (Signed)
I have sent message to [facility]. Waiting to be advised.

## 2019-10-19 NOTE — Telephone Encounter (Signed)
Pt called stating she needs the number to the company that will be doing her after surgery care; pt states they are supposed to be putting her leg in a "flexing machine" pt would like a CB to discuss further. Pt also states the phone numbers that did reach out to her on 10/18/19 are out of service and not working when she called them back; pt asks that we confirm with the company that they have the correct number.   [phone]

## 2019-10-20 MED ORDER — DEXTROSE 5 % IV SOLN
3.0000 g | INTRAVENOUS | Status: AC
  Administered 2019-10-21 (×2): 3 g via INTRAVENOUS
  Filled 2019-10-20: qty 3

## 2019-10-21 ENCOUNTER — Encounter (HOSPITAL_COMMUNITY): Payer: Self-pay | Admitting: Orthopedic Surgery

## 2019-10-21 ENCOUNTER — Encounter (HOSPITAL_COMMUNITY)
Admission: RE | Disposition: A | Discharge status: Home / Self Care | Payer: Self-pay | Source: Home / Self Care | Attending: Orthopedic Surgery

## 2019-10-21 ENCOUNTER — Ambulatory Visit (HOSPITAL_COMMUNITY)
Admission: RE | Admit: 2019-10-21 | Discharge: 2019-10-21 | Disposition: A | Discharge status: Home / Self Care | Payer: Medicaid Other | Attending: Orthopedic Surgery | Admitting: Orthopedic Surgery

## 2019-10-21 ENCOUNTER — Ambulatory Visit (HOSPITAL_COMMUNITY): Payer: Medicaid Other | Admitting: Physician Assistant

## 2019-10-21 ENCOUNTER — Other Ambulatory Visit: Payer: Self-pay

## 2019-10-21 DIAGNOSIS — Z8673 Personal history of transient ischemic attack (TIA), and cerebral infarction without residual deficits: Secondary | ICD-10-CM | POA: Diagnosis not present

## 2019-10-21 DIAGNOSIS — S83271D Complex tear of lateral meniscus, current injury, right knee, subsequent encounter: Secondary | ICD-10-CM

## 2019-10-21 DIAGNOSIS — S83411A Sprain of medial collateral ligament of right knee, initial encounter: Secondary | ICD-10-CM | POA: Insufficient documentation

## 2019-10-21 DIAGNOSIS — S83511A Sprain of anterior cruciate ligament of right knee, initial encounter: Secondary | ICD-10-CM | POA: Insufficient documentation

## 2019-10-21 DIAGNOSIS — S8991XD Unspecified injury of right lower leg, subsequent encounter: Secondary | ICD-10-CM

## 2019-10-21 DIAGNOSIS — S83241A Other tear of medial meniscus, current injury, right knee, initial encounter: Secondary | ICD-10-CM | POA: Insufficient documentation

## 2019-10-21 DIAGNOSIS — I10 Essential (primary) hypertension: Secondary | ICD-10-CM | POA: Diagnosis not present

## 2019-10-21 DIAGNOSIS — S83281A Other tear of lateral meniscus, current injury, right knee, initial encounter: Secondary | ICD-10-CM | POA: Diagnosis not present

## 2019-10-21 DIAGNOSIS — F17210 Nicotine dependence, cigarettes, uncomplicated: Secondary | ICD-10-CM | POA: Diagnosis not present

## 2019-10-21 DIAGNOSIS — S83511D Sprain of anterior cruciate ligament of right knee, subsequent encounter: Secondary | ICD-10-CM | POA: Diagnosis not present

## 2019-10-21 DIAGNOSIS — S83411D Sprain of medial collateral ligament of right knee, subsequent encounter: Secondary | ICD-10-CM | POA: Diagnosis not present

## 2019-10-21 DIAGNOSIS — Z419 Encounter for procedure for purposes other than remedying health state, unspecified: Secondary | ICD-10-CM

## 2019-10-21 HISTORY — PX: ANTERIOR CRUCIATE LIGAMENT REPAIR: SHX115

## 2019-10-21 LAB — POCT PREGNANCY, URINE: Preg Test, Ur: NEGATIVE

## 2019-10-21 SURGERY — RECONSTRUCTION, KNEE, ACL
Anesthesia: General | Site: Knee | Laterality: Right

## 2019-10-21 MED ORDER — PROPOFOL 10 MG/ML IV BOLUS
INTRAVENOUS | Status: DC | PRN
  Administered 2019-10-21: 200 mg via INTRAVENOUS

## 2019-10-21 MED ORDER — BUPIVACAINE HCL (PF) 0.25 % IJ SOLN
INTRAMUSCULAR | Status: AC
  Filled 2019-10-21: qty 30

## 2019-10-21 MED ORDER — KETOROLAC TROMETHAMINE 30 MG/ML IJ SOLN
INTRAMUSCULAR | Status: AC
  Filled 2019-10-21: qty 1

## 2019-10-21 MED ORDER — ROCURONIUM BROMIDE 10 MG/ML (PF) SYRINGE
PREFILLED_SYRINGE | INTRAVENOUS | Status: AC
  Filled 2019-10-21: qty 10

## 2019-10-21 MED ORDER — POVIDONE-IODINE 10 % EX SWAB: 2.0000 "application " | Freq: Once | CUTANEOUS | Status: AC

## 2019-10-21 MED ORDER — CHLORHEXIDINE GLUCONATE 0.12 % MT SOLN
OROMUCOSAL | Status: AC
  Administered 2019-10-21: 15 mL via OROMUCOSAL
  Filled 2019-10-21: qty 15

## 2019-10-21 MED ORDER — MORPHINE SULFATE (PF) 4 MG/ML IV SOLN
INTRAVENOUS | Status: DC | PRN
  Administered 2019-10-21: 8 mg

## 2019-10-21 MED ORDER — ACETAMINOPHEN 500 MG PO TABS
ORAL_TABLET | ORAL | Status: AC
  Administered 2019-10-21: 1000 mg via ORAL
  Filled 2019-10-21: qty 2

## 2019-10-21 MED ORDER — BUPIVACAINE-EPINEPHRINE (PF) 0.5% -1:200000 IJ SOLN
INTRAMUSCULAR | Status: DC | PRN
Start: 2019-10-21 — End: 2019-10-21
  Administered 2019-10-21: 15 mL via PERINEURAL

## 2019-10-21 MED ORDER — 0.9 % SODIUM CHLORIDE (POUR BTL) OPTIME
TOPICAL | Status: DC | PRN
  Administered 2019-10-21 (×4): 1000 mL

## 2019-10-21 MED ORDER — ORAL CARE MOUTH RINSE: 15.0000 mL | Freq: Once | OROMUCOSAL | Status: AC

## 2019-10-21 MED ORDER — MIDAZOLAM HCL 2 MG/2ML IJ SOLN
INTRAMUSCULAR | Status: AC
  Filled 2019-10-21: qty 2

## 2019-10-21 MED ORDER — DEXAMETHASONE SODIUM PHOSPHATE 10 MG/ML IJ SOLN
INTRAMUSCULAR | Status: AC
  Filled 2019-10-21: qty 1

## 2019-10-21 MED ORDER — BUPIVACAINE HCL (PF) 0.25 % IJ SOLN
INTRAMUSCULAR | Status: DC | PRN
  Administered 2019-10-21 (×2): 30 mL

## 2019-10-21 MED ORDER — CLONIDINE HCL (ANALGESIA) 100 MCG/ML EP SOLN
EPIDURAL | Status: DC | PRN
  Administered 2019-10-21: 100 ug

## 2019-10-21 MED ORDER — CHLORHEXIDINE GLUCONATE 0.12 % MT SOLN: 15.0000 mL | Freq: Once | OROMUCOSAL | Status: AC

## 2019-10-21 MED ORDER — MIDAZOLAM HCL 2 MG/2ML IJ SOLN: 2.0000 mg | Freq: Once | INTRAMUSCULAR | Status: AC

## 2019-10-21 MED ORDER — EPINEPHRINE PF 1 MG/ML IJ SOLN
INTRAMUSCULAR | Status: AC
  Filled 2019-10-21: qty 2

## 2019-10-21 MED ORDER — ALBUTEROL SULFATE HFA 108 (90 BASE) MCG/ACT IN AERS
INHALATION_SPRAY | RESPIRATORY_TRACT | Status: DC | PRN
  Administered 2019-10-21: 8 via RESPIRATORY_TRACT
  Administered 2019-10-21: 12 via RESPIRATORY_TRACT

## 2019-10-21 MED ORDER — MORPHINE SULFATE (PF) 4 MG/ML IV SOLN
INTRAVENOUS | Status: AC
  Filled 2019-10-21: qty 2

## 2019-10-21 MED ORDER — FENTANYL CITRATE (PF) 250 MCG/5ML IJ SOLN
INTRAMUSCULAR | Status: AC
  Filled 2019-10-21: qty 5

## 2019-10-21 MED ORDER — POVIDONE-IODINE 7.5 % EX SOLN: Freq: Once | CUTANEOUS | Status: DC

## 2019-10-21 MED ORDER — SUGAMMADEX SODIUM 200 MG/2ML IV SOLN
INTRAVENOUS | Status: DC | PRN
  Administered 2019-10-21: 200 mg via INTRAVENOUS

## 2019-10-21 MED ORDER — OXYCODONE HCL 5 MG PO TABS
ORAL_TABLET | ORAL | Status: AC
  Filled 2019-10-21: qty 1

## 2019-10-21 MED ORDER — MELOXICAM 15 MG PO TABS
15.0000 mg | ORAL_TABLET | Freq: Every day | ORAL | 0 refills | Status: DC
Start: 2019-10-21 — End: 2019-11-19

## 2019-10-21 MED ORDER — FENTANYL CITRATE (PF) 100 MCG/2ML IJ SOLN: 50.0000 ug | Freq: Once | INTRAMUSCULAR | Status: AC

## 2019-10-21 MED ORDER — FENTANYL CITRATE (PF) 100 MCG/2ML IJ SOLN
INTRAMUSCULAR | Status: AC
  Administered 2019-10-21: 50 ug via INTRAVENOUS
  Filled 2019-10-21: qty 2

## 2019-10-21 MED ORDER — SCOPOLAMINE 1 MG/3DAYS TD PT72
MEDICATED_PATCH | TRANSDERMAL | Status: AC
  Administered 2019-10-21: 1.5 mg via TRANSDERMAL
  Filled 2019-10-21: qty 1

## 2019-10-21 MED ORDER — ONDANSETRON HCL 4 MG/2ML IJ SOLN
INTRAMUSCULAR | Status: DC | PRN
  Administered 2019-10-21 (×2): 4 mg via INTRAVENOUS

## 2019-10-21 MED ORDER — ROCURONIUM BROMIDE 10 MG/ML (PF) SYRINGE
PREFILLED_SYRINGE | INTRAVENOUS | Status: DC | PRN
  Administered 2019-10-21: 10 mg via INTRAVENOUS
  Administered 2019-10-21: 80 mg via INTRAVENOUS
  Administered 2019-10-21 (×2): 20 mg via INTRAVENOUS

## 2019-10-21 MED ORDER — METHOCARBAMOL 500 MG PO TABS: 500.0000 mg | ORAL_TABLET | Freq: Three times a day (TID) | ORAL | 0 refills | Status: DC | PRN

## 2019-10-21 MED ORDER — POVIDONE-IODINE 10 % EX SWAB
2.0000 "application " | Freq: Once | CUTANEOUS | Status: AC
  Administered 2019-10-21: 2 via TOPICAL

## 2019-10-21 MED ORDER — LIDOCAINE 2% (20 MG/ML) 5 ML SYRINGE
INTRAMUSCULAR | Status: DC | PRN
  Administered 2019-10-21: 100 mg via INTRAVENOUS

## 2019-10-21 MED ORDER — EPHEDRINE SULFATE-NACL 50-0.9 MG/10ML-% IV SOSY
PREFILLED_SYRINGE | INTRAVENOUS | Status: DC | PRN
  Administered 2019-10-21 (×5): 5 mg via INTRAVENOUS

## 2019-10-21 MED ORDER — LACTATED RINGERS IV SOLN: INTRAVENOUS | Status: DC

## 2019-10-21 MED ORDER — OXYCODONE HCL 5 MG PO TABS: 5.0000 mg | ORAL_TABLET | Freq: Once | ORAL | Status: DC | PRN

## 2019-10-21 MED ORDER — DEXAMETHASONE SODIUM PHOSPHATE 10 MG/ML IJ SOLN
INTRAMUSCULAR | Status: DC | PRN
  Administered 2019-10-21: 10 mg via INTRAVENOUS

## 2019-10-21 MED ORDER — MIDAZOLAM HCL 2 MG/2ML IJ SOLN
INTRAMUSCULAR | Status: AC
  Administered 2019-10-21: 2 mg via INTRAVENOUS
  Filled 2019-10-21: qty 2

## 2019-10-21 MED ORDER — KETOROLAC TROMETHAMINE 30 MG/ML IJ SOLN
INTRAMUSCULAR | Status: DC | PRN
Start: 2019-10-21 — End: 2019-10-21
  Administered 2019-10-21: 30 mg via INTRAVENOUS

## 2019-10-21 MED ORDER — ASPIRIN EC 81 MG PO TBEC
81.0000 mg | DELAYED_RELEASE_TABLET | Freq: Every day | ORAL | 0 refills | Status: DC
Start: 2019-10-21 — End: 2019-11-14

## 2019-10-21 MED ORDER — CEFAZOLIN SODIUM 1 G IJ SOLR
INTRAMUSCULAR | Status: AC
  Filled 2019-10-21: qty 30

## 2019-10-21 MED ORDER — PROPOFOL 10 MG/ML IV BOLUS
INTRAVENOUS | Status: AC
  Filled 2019-10-21: qty 40

## 2019-10-21 MED ORDER — OXYCODONE HCL 5 MG/5ML PO SOLN: 5.0000 mg | Freq: Once | ORAL | Status: DC | PRN

## 2019-10-21 MED ORDER — PROMETHAZINE HCL 25 MG/ML IJ SOLN: 6.2500 mg | INTRAMUSCULAR | Status: DC | PRN

## 2019-10-21 MED ORDER — SODIUM CHLORIDE 0.9 % IR SOLN
Status: DC | PRN
  Administered 2019-10-21 (×6): 3000 mL

## 2019-10-21 MED ORDER — LIDOCAINE 2% (20 MG/ML) 5 ML SYRINGE
INTRAMUSCULAR | Status: AC
  Filled 2019-10-21: qty 5

## 2019-10-21 MED ORDER — FENTANYL CITRATE (PF) 100 MCG/2ML IJ SOLN: 25.0000 ug | INTRAMUSCULAR | Status: DC | PRN

## 2019-10-21 MED ORDER — EPINEPHRINE PF 1 MG/ML IJ SOLN
INTRAMUSCULAR | Status: DC | PRN
  Administered 2019-10-21: 1 mg
  Administered 2019-10-21: .15 mL
  Administered 2019-10-21: 1 mg

## 2019-10-21 MED ORDER — ONDANSETRON HCL 4 MG/2ML IJ SOLN
INTRAMUSCULAR | Status: AC
  Filled 2019-10-21: qty 2

## 2019-10-21 MED ORDER — OXYCODONE-ACETAMINOPHEN 5-325 MG PO TABS: 1.0000 | ORAL_TABLET | ORAL | 0 refills | Status: DC | PRN

## 2019-10-21 MED ORDER — CLONIDINE HCL (ANALGESIA) 100 MCG/ML EP SOLN
EPIDURAL | Status: AC
  Filled 2019-10-21: qty 10

## 2019-10-21 MED ORDER — FENTANYL CITRATE (PF) 250 MCG/5ML IJ SOLN
INTRAMUSCULAR | Status: DC | PRN
  Administered 2019-10-21: 50 ug via INTRAVENOUS
  Administered 2019-10-21: 100 ug via INTRAVENOUS
  Administered 2019-10-21 (×4): 50 ug via INTRAVENOUS

## 2019-10-21 MED ORDER — SCOPOLAMINE 1 MG/3DAYS TD PT72: 1.0000 | MEDICATED_PATCH | Freq: Once | TRANSDERMAL | Status: DC

## 2019-10-21 MED ORDER — ACETAMINOPHEN 500 MG PO TABS: 1000.0000 mg | ORAL_TABLET | Freq: Once | ORAL | Status: AC

## 2019-10-21 SURGICAL SUPPLY — 98 items
ANCHOR BUTTON TIGHTROPE II FT (Plate) ×3 IMPLANT
ANCHOR BUTTON TIGHTROPE II OP (Plate) ×3 IMPLANT
ANCHOR SUT BIO SW 4.75X19.1 (Anchor) ×3 IMPLANT
BANDAGE ESMARK 6X9 LF (GAUZE/BANDAGES/DRESSINGS) ×1 IMPLANT
BLADE CUTTER GATOR 3.5 (BLADE) ×3 IMPLANT
BLADE EXCALIBUR 4.0MM X 13CM (MISCELLANEOUS) ×1
BLADE EXCALIBUR 4.0X13 (MISCELLANEOUS) ×2 IMPLANT
BLADE SURG 10 STRL SS (BLADE) ×3 IMPLANT
BLADE SURG 15 STRL LF DISP TIS (BLADE) ×3 IMPLANT
BLADE SURG 15 STRL SS (BLADE) ×6
BNDG ELASTIC 6X15 VLCR STRL LF (GAUZE/BANDAGES/DRESSINGS) ×3 IMPLANT
BNDG ESMARK 6X9 LF (GAUZE/BANDAGES/DRESSINGS) ×3
CLOSURE STERI-STRIP 1/2X4 (GAUZE/BANDAGES/DRESSINGS) ×1
CLSR STERI-STRIP ANTIMIC 1/2X4 (GAUZE/BANDAGES/DRESSINGS) ×2 IMPLANT
COVER SURGICAL LIGHT HANDLE (MISCELLANEOUS) ×3 IMPLANT
COVER WAND RF STERILE (DRAPES) IMPLANT
CUFF TOURN SGL QUICK 34 (TOURNIQUET CUFF)
CUFF TOURN SGL QUICK 42 (TOURNIQUET CUFF) IMPLANT
CUFF TRNQT CYL 34X4.125X (TOURNIQUET CUFF) IMPLANT
CUTTER BONE 4.0MM X 13CM (MISCELLANEOUS) ×3 IMPLANT
DECANTER SPIKE VIAL GLASS SM (MISCELLANEOUS) ×3 IMPLANT
DRAPE ARTHROSCOPY W/POUCH 114 (DRAPES) ×3 IMPLANT
DRAPE C-ARM 42X72 X-RAY (DRAPES) ×3 IMPLANT
DRAPE INCISE IOBAN 66X45 STRL (DRAPES) ×3 IMPLANT
DRAPE U-SHAPE 47X51 STRL (DRAPES) ×3 IMPLANT
DRILL FLIPCUTTER III 6-12 (ORTHOPEDIC DISPOSABLE SUPPLIES) ×1 IMPLANT
DRSG PAD ABDOMINAL 8X10 ST (GAUZE/BANDAGES/DRESSINGS) ×9 IMPLANT
DRSG TEGADERM 4X4.5 CHG (GAUZE/BANDAGES/DRESSINGS) ×9 IMPLANT
DRSG TEGADERM 4X4.75 (GAUZE/BANDAGES/DRESSINGS) ×12 IMPLANT
DW OUTFLOW CASSETTE/TUBE SET (MISCELLANEOUS) ×3 IMPLANT
ELECT REM PT RETURN 9FT ADLT (ELECTROSURGICAL) ×3
ELECTRODE REM PT RTRN 9FT ADLT (ELECTROSURGICAL) ×1 IMPLANT
FLIPCUTTER III 6-12 AR-1204FF (ORTHOPEDIC DISPOSABLE SUPPLIES) ×3
GAUZE SPONGE 4X4 12PLY STRL (GAUZE/BANDAGES/DRESSINGS) ×3 IMPLANT
GAUZE SPONGE 4X4 12PLY STRL LF (GAUZE/BANDAGES/DRESSINGS) ×3 IMPLANT
GAUZE XEROFORM 1X8 LF (GAUZE/BANDAGES/DRESSINGS) ×3 IMPLANT
GLOVE BIO SURGEON ST LM GN SZ9 (GLOVE) ×3 IMPLANT
GLOVE BIOGEL PI IND STRL 8 (GLOVE) ×1 IMPLANT
GLOVE BIOGEL PI INDICATOR 8 (GLOVE) ×2
GLOVE ECLIPSE 8.0 STRL XLNG CF (GLOVE) ×3 IMPLANT
GOWN STRL REUS W/ TWL LRG LVL3 (GOWN DISPOSABLE) ×3 IMPLANT
GOWN STRL REUS W/ TWL XL LVL3 (GOWN DISPOSABLE) ×1 IMPLANT
GOWN STRL REUS W/TWL LRG LVL3 (GOWN DISPOSABLE) ×6
GOWN STRL REUS W/TWL XL LVL3 (GOWN DISPOSABLE) ×2
IMMOBILIZER KNEE 24 THIGH 36 (MISCELLANEOUS) ×1 IMPLANT
IMMOBILIZER KNEE 24 UNIV (MISCELLANEOUS) ×3
IMP SYS 2ND FIX PEEK 4.75X19.1 (Miscellaneous) ×3 IMPLANT
IMPL SYS 2ND FX PEEK 4.75X19.1 (Miscellaneous) ×1 IMPLANT
KIT BASIN OR (CUSTOM PROCEDURE TRAY) ×3 IMPLANT
KIT BIOCARTILAGE DEL W/SYRINGE (KITS) ×3 IMPLANT
KIT BIOCARTILAGE LG JOINT MIX (KITS) ×3 IMPLANT
KIT TURNOVER KIT B (KITS) ×3 IMPLANT
LOOP VESSEL MAXI BLUE (MISCELLANEOUS) ×3 IMPLANT
MANIFOLD NEPTUNE II (INSTRUMENTS) ×3 IMPLANT
NEEDLE 18GX1X1/2 (RX/OR ONLY) (NEEDLE) ×6 IMPLANT
NEEDLE SPNL 18GX3.5 QUINCKE PK (NEEDLE) ×3 IMPLANT
NS IRRIG 1000ML POUR BTL (IV SOLUTION) ×12 IMPLANT
PACK ARTHROSCOPY DSU (CUSTOM PROCEDURE TRAY) ×3 IMPLANT
PAD ABD 8X10 STRL (GAUZE/BANDAGES/DRESSINGS) ×3 IMPLANT
PAD ARMBOARD 7.5X6 YLW CONV (MISCELLANEOUS) ×6 IMPLANT
PAD CAST 4YDX4 CTTN HI CHSV (CAST SUPPLIES) ×1 IMPLANT
PADDING CAST COTTON 4X4 STRL (CAST SUPPLIES) ×2
PADDING CAST COTTON 6X4 STRL (CAST SUPPLIES) ×9 IMPLANT
PENCIL BUTTON HOLSTER BLD 10FT (ELECTRODE) IMPLANT
PUTTY DBM ALLOSYNC PURE 5CC (Putty) ×3 IMPLANT
SPONGE LAP 4X18 RFD (DISPOSABLE) ×6 IMPLANT
SUCTION FRAZIER HANDLE 10FR (MISCELLANEOUS) ×2
SUCTION TUBE FRAZIER 10FR DISP (MISCELLANEOUS) ×1 IMPLANT
SUT 2 FIBERLOOP 20 STRT BLUE (SUTURE) ×3
SUT ETHILON 3 0 PS 1 (SUTURE) ×9 IMPLANT
SUT MENISCAL KIT (KITS) IMPLANT
SUT MNCRL AB 3-0 PS2 27 (SUTURE) ×6 IMPLANT
SUT MNCRL AB 4-0 PS2 18 (SUTURE) ×3 IMPLANT
SUT SILK 3 0 (SUTURE) ×4
SUT SILK 3-0 18XBRD TIE 12 (SUTURE) ×2 IMPLANT
SUT VIC AB 0 CT1 27 (SUTURE) ×10
SUT VIC AB 0 CT1 27XBRD ANBCTR (SUTURE) ×5 IMPLANT
SUT VIC AB 1 CT1 27 (SUTURE) ×2
SUT VIC AB 1 CT1 27XBRD ANBCTR (SUTURE) ×1 IMPLANT
SUT VIC AB 2-0 CT1 27 (SUTURE) ×6
SUT VIC AB 2-0 CT1 TAPERPNT 27 (SUTURE) ×3 IMPLANT
SUT VICRYL 0 UR6 27IN ABS (SUTURE) ×9 IMPLANT
SUTURE 2 FIBERLOOP 20 STRT BLU (SUTURE) ×1 IMPLANT
SUTURE TAPE 1.3 40 TPR END (SUTURE) ×2 IMPLANT
SUTURETAPE 1.3 40 TPR END (SUTURE) ×6
SYR 30ML LL (SYRINGE) ×6 IMPLANT
SYR BULB IRRIG 60ML STRL (SYRINGE) ×3 IMPLANT
SYR TB 1ML LUER SLIP (SYRINGE) ×3 IMPLANT
SYS INTERNAL BRACE KNEE (Miscellaneous) ×3 IMPLANT
SYSTEM INTERNAL BRACE KNEE (Miscellaneous) ×1 IMPLANT
TENDON PRE-SUTURED FLEXIGRAFT (Tissue) ×3 IMPLANT
TISSUE FLEXIGRAFT QUADLINK (Tissue) ×3 IMPLANT
TOWEL GREEN STERILE (TOWEL DISPOSABLE) ×6 IMPLANT
TOWEL GREEN STERILE FF (TOWEL DISPOSABLE) ×3 IMPLANT
TUBING ARTHROSCOPY IRRIG 16FT (MISCELLANEOUS) ×3 IMPLANT
UNDERPAD 30X36 HEAVY ABSORB (UNDERPADS AND DIAPERS) ×3 IMPLANT
WATER STERILE IRR 1000ML POUR (IV SOLUTION) ×3 IMPLANT
WRAP KNEE MAXI GEL POST OP (GAUZE/BANDAGES/DRESSINGS) ×3 IMPLANT
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
## 2019-10-21 NOTE — Progress Notes (Signed)
Orthopedic Tech Progress Note Patient Details:  Julia Hopkins [birth year] [id number] Called in order to [employer] for Bledsoe Knee brace.  Patient ID: Julia Hopkins, female   DOB: 02/06/1991, 29 y.o.   MRN: [id number]   [name] 10/21/2019, 5:36 PM

## 2019-10-21 NOTE — Anesthesia Procedure Notes (Signed)
Anesthesia Regional Block: Adductor canal block   Pre-Anesthetic Checklist: ,, timeout performed, Correct Patient, Correct Site, Correct Laterality, Correct Procedure, Correct Position, site marked, Risks and benefits discussed, pre-op evaluation,  At surgeon's request and post-op pain management  Laterality: Right  Prep: Maximum Sterile Barrier Precautions used, chloraprep       Needles:  Injection technique: Single-shot  Needle Type: Echogenic Stimulator Needle     Needle Length: 9cm  Needle Gauge: 22     Additional Needles:   Procedures:,,,, ultrasound used (permanent image in chart),,,,  Narrative:  Start time: 10/21/2019 11:50 AM End time: 10/21/2019 11:53 AM Injection made incrementally with aspirations every 5 mL.  Performed by: Personally  Anesthesiologist: Kaylyn Layer, MD  Additional Notes: Risks, benefits, and alternative discussed. Patient gave consent for procedure. Patient prepped and draped in sterile fashion. Sedation administered, patient remains easily responsive to voice. Relevant anatomy identified with ultrasound guidance. Local anesthetic given in 5cc increments with no signs or symptoms of intravascular injection. No pain or paraesthesias with injection. Patient monitored throughout procedure with signs of LAST or immediate complications. Tolerated well. Ultrasound image placed in chart.  Julia Greenhouse, MD

## 2019-10-21 NOTE — Op Note (Signed)
NAMELORRAYNE, Julia Hopkins MEDICAL RECORD IO:[id number] ACCOUNT [id number] DATE OF BIRTH:12-Feb-1991 FACILITY: [facility] LOCATION: [facility] PHYSICIAN:Emrik Erhard Diamantina Providence, MD  OPERATIVE REPORT  DATE OF PROCEDURE:  10/21/2019  PREOPERATIVE DIAGNOSES:   1.  Right knee anterior cruciate ligament tear.  2.   Medial and lateral meniscal tears.   3.  Posterolateral corner injury. 4.  Medial collateral ligament avulsion from the medial femoral condyle.  POSTOPERATIVE DIAGNOSES:   1.  Right knee anterior cruciate ligament tear.  2.   Medial and lateral meniscal tears.   3.  Posterolateral corner injury. 4.  Medial collateral ligament avulsion from the medial femoral condyle.  PROCEDURE 1.  Right knee anterior cruciate ligament reconstruction using quad autograft. 2.  Partial medial and lateral meniscectomy. 3.  Posterolateral corner reconstruction with semitendinosis allograft. 4.  Medial collateral ligament repair.  SURGEON:  Cammy Copa, MD  ASSISTANT:  Karenann Cai, PA.  INDICATIONS:  This is a patient who injured her right knee several months ago and reports instability and pain which prevents her from doing her activities of daily living, as well as from working.  She presents now for operative management after  explanation of risks and benefits.  MRI scan confirms a medial and lateral meniscal, ACL tearing, as well as MCL injury and evidence of posterolateral corner injury.  PROCEDURE IN DETAIL:  The patient was brought to the operating room where a general anesthetic was induced.  Preoperative antibiotics administered.  Timeout was called.  Right leg prescrubbed with alcohol and Betadine, allowed to air dry,  prepped with  DuraPrep solution and draped in a sterile manner.  Ioban used to cover the operative field.  The tourniquet initially was not inflated.  Right knee was examined under anesthesia, found to have instability to valgus stress at 0 and 30 degrees, measuring  about 7-8 mm.  The  patient did have increased posterolateral rotatory instability at 30 degrees, but not 90 degrees of flexion.  The lateral collateral ligament was stable and this was also intact on the MRI scan.  The ACL was out.  The PCL intact.  The  patient had about 7 degrees of hyperextension bilaterally.  Following sterile prepping and draping, Ioban was used to cover the entire operative field.  Portals were anesthetized using 7 mL of Marcaine with epinephrine.  An anterior inferolateral portal  was established, anterior inferomedial portal was established under direct visualization.  Diagnostic arthroscopy was performed.  The patient had irreparable tear of the lateral meniscus.  This was debrided.  The joint surfaces on the lateral femoral  condyle and lateral tibial plateau intact.  ACL was torn.  The medial meniscus was torn 40% anterior posterior width at the posterior horn.  This was also debrided.  Nothing repairable with the meniscal tears.  The articular surface on the medial femoral  condyle and tibial plateau intact.  Patellofemoral joint also intact.  At this time, ACL stump debrided, notchplasty performed.  The meniscal tears were debrided using a combination of basket punch and shaver.  Next, the graft was prepared, which was an  allograft.  Quad allograft measured 10 mm.  The femoral tunnel was then drilled in the 9 o'clock position.  Tibial tunnel drilled posterior aspect of the native ACL footprint.  Graft was then passed with bone graft placed into the tunnels.  Very secure  fixation was achieved, tightened in extension.  Next, attention was directed towards the posterolateral corner.  A tourniquet was inflated for this part of the  case.  An incision was made curvilinear beginning between Gerdy's tubercle and the fibular  head and extending proximally to about the midportion of the iliotibial band.  Skin and subcutaneous tissue were sharply divided.  The peroneal nerve was identified and a vessel loop  placed around it.  It was protected at all times during the case.   Next, the fibular head was drilled with a guide pin, drilled 5 mm.  At this time, the incision was made longitudinally parallel to the fibers of the iliotibial band over the proximal anterior aspect of the popliteus attachment.  A tunnel was drilled here  and the semitendinosis allograft was then placed into this region.  It was then passed underneath the iliotibial band and behind the hamstring tendons and passed posterior to anterior through the fibular head, tightened in 30 degrees of flexion with  internal rotation and an interference screw was placed.  Lateral collateral ligaments stable.  Next, attention was directed towards the medial side.  Incision made on the medial aspect of the knee, extending from the anticipated location of the medial  femoral condyle distally about 8 cm.  Skin and subcutaneous tissue were sharply divided.  A significant soft tissue envelope was present.  The superficial and deep MCL were identified.  Two SutureTapes were placed from [employer] into the medial collateral  ligament, which had become detached from its attachment site on the medial femoral condyle. Once the SutureTapes were placed, isometric point was identified and in accordance with ____ anatomy, was placed back into its attachment site using a 4/5  SwiveLocks.  Secure fixation was achieved.  This was done in about 30 degrees of flexion with varus stress.  Secure repair was achieved.  It was reinforced with capsular ties to the superficial and deep MCL and the surrounding capsule.  All in all, a  very strong construct was achieved.  The tourniquet was released.  Bleeding points encountered controlled using electrocautery.  Thorough irrigation was performed of both the knee joint as well as in both incisions.  Medial incision closed using #1   Vicryl suture, 0 Vicryl suture, 2-0 Vicryl suture and a 3-0 Monocryl.  Steri-Strips applied.  Portals  closed using 2-0 Vicryl and 3-0 nylon.  Incision for the tibial site was irrigated and closed using 2-0 Vicryl, 3-0 nylon.  It should be noted that the  dual Endobutton technique was utilized for the Arthrex quad allograft.  This was reinforced on the tibial side with a SwiveLock.  The lateral side was closed, the vessel loop was removed from around the peroneal nerve and the incision in the iliotibial  band was closed using #1 Vicryl suture.  The incision was then closed using 0 Vicryl suture, 2-0 Vicryl suture and a 3-0 Monocryl.  Steri-Strips and an impervious dressing was placed around.  All incisions were then anesthetized using combination of  Marcaine, morphine, clonidine.  The patient tolerated the procedure well without immediate complications.  She was transferred to the recovery room in stable condition.  Luke's assistance was required at all times for graft preparation, opening and  closing, limb positioning, tissue management.  His assistance was a medical necessity.  VN/NUANCE  D:10/21/2019 T:10/21/2019 JOB:[id number]

## 2019-10-21 NOTE — Anesthesia Postprocedure Evaluation (Signed)
Anesthesia Post Note  Patient: Julia Hopkins  Procedure(s) Performed: RIGHT KNEE ANTERIOR CRUCIATE LIGAMENT (ACL) RECONSTRUCTION WITH QUAD ALLOGRAFT, POSTEROLATERAL CORNER RECONSTRUCTION WITH ALLOGRAFT, MENISCAL REPAIR vs DEBRIDEMENT, MEDIAL COLLATERAL LIGAMENT TEAR REPAIR (Right Knee)     Patient location during evaluation: PACU Anesthesia Type: General Level of consciousness: awake and alert Pain management: pain level controlled Vital Signs Assessment: post-procedure vital signs reviewed and stable Respiratory status: spontaneous breathing, nonlabored ventilation, respiratory function stable and patient connected to nasal cannula oxygen Cardiovascular status: blood pressure returned to baseline and stable Postop Assessment: no apparent nausea or vomiting Anesthetic complications: no   No complications documented.  Last Vitals:  Vitals:   10/21/19 1150 10/21/19 1155  BP: (!) 138/96 115/70  Pulse: 64 62  Resp: 10 (!) 9  Temp:    SpO2: 99% 99%    Last Pain:  Vitals:   10/21/19 1841  TempSrc:   PainSc: Asleep                 Tia Gelb COKER

## 2019-10-21 NOTE — Transfer of Care (Signed)
Immediate Anesthesia Transfer of Care Note  Patient: Julia Hopkins  Procedure(s) Performed: RIGHT KNEE ANTERIOR CRUCIATE LIGAMENT (ACL) RECONSTRUCTION WITH QUAD ALLOGRAFT, POSTEROLATERAL CORNER RECONSTRUCTION WITH ALLOGRAFT, MENISCAL REPAIR vs DEBRIDEMENT, MEDIAL COLLATERAL LIGAMENT TEAR REPAIR (Right Knee)  Patient Location: PACU  Anesthesia Type:GA combined with regional for post-op pain  Level of Consciousness: drowsy  Airway & Oxygen Therapy: Patient Spontanous Breathing and Patient connected to face mask oxygen  Post-op Assessment: Report given to RN and Post -op Vital signs reviewed and stable  Post vital signs: Reviewed and stable  Last Vitals:  Vitals Value Taken Time  BP 137/93 10/21/19 1812  Temp    Pulse 79 10/21/19 1817  Resp 16 10/21/19 1817  SpO2 97 % 10/21/19 1817  Vitals shown include unvalidated device data.  Last Pain:  Vitals:   10/21/19 1155  TempSrc:   PainSc: 0-No pain      Patients Stated Pain Goal: 6 (10/21/19 1103)  Complications: No complications documented.

## 2019-10-21 NOTE — Anesthesia Procedure Notes (Signed)
Procedure Name: Intubation Date/Time: 10/21/2019 12:49 PM Performed by: Colin Benton, CRNA Pre-anesthesia Checklist: Patient identified, Emergency Drugs available, Suction available and Patient being monitored Patient Re-evaluated:Patient Re-evaluated prior to induction Oxygen Delivery Method: Circle System Utilized Preoxygenation: Pre-oxygenation with 100% oxygen Induction Type: IV induction Ventilation: Mask ventilation without difficulty Laryngoscope Size: Mac and 3 Tube type: Oral Tube size: 7.0 mm Number of attempts: 1 Airway Equipment and Method: Stylet and Oral airway Placement Confirmation: ETT inserted through vocal cords under direct vision,  positive ETCO2 and breath sounds checked- equal and bilateral Secured at: 21 cm Tube secured with: Tape Dental Injury: Teeth and Oropharynx as per pre-operative assessment  Comments: Intubated by Levert Feinstein, SRNA

## 2019-10-21 NOTE — Brief Op Note (Signed)
   10/21/2019  6:17 PM  PATIENT:  Julia Hopkins  29 y.o. female  PRE-OPERATIVE DIAGNOSIS:  right anterior cruciate ligament tear, lateral meniscal tear, medial meniscal tear, posterolateral corner injury, medial collateral ligament tear  POST-OPERATIVE DIAGNOSIS:  right anterior cruciate ligament tear, lateral meniscal tear, medial meniscal tear, posterolateral corner injury, medial collateral ligament tear  PROCEDURE:  Procedure(s): RIGHT KNEE ANTERIOR CRUCIATE LIGAMENT (ACL) RECONSTRUCTION WITH QUAD ALLOGRAFT, POSTEROLATERAL CORNER RECONSTRUCTION WITH ALLOGRAFT, bilateral MENISCAL  DEBRIDEMENT, MEDIAL COLLATERAL LIGAMENT TEAR REPAIR  SURGEON:  Surgeon(s): August Saucer, Corrie Mckusick, MD  ASSISTANT: Magnant PA  ANESTHESIA:   general  EBL: 25 ml    Total I/O In: 1550 [I.V.:1500; IV Piggyback:50] Out: 310 [Urine:260; Blood:50]  BLOOD ADMINISTERED: none  DRAINS: none   LOCAL MEDICATIONS USED: Marcaine morphine clonidine  SPECIMEN:  No Specimen  COUNTS:  YES  TOURNIQUET:   Total Tourniquet Time Documented: Thigh (Right) - 121 minutes Total: Thigh (Right) - 121 minutes   DICTATION: .Other Dictation: Dictation Number [id number]  PLAN OF CARE: Discharge to home after PACU  PATIENT DISPOSITION:  PACU - hemodynamically stable

## 2019-10-21 NOTE — Progress Notes (Signed)
Orthopedic Tech Progress Note Patient Details:  Julia Hopkins Apr 21, 1990 [id number]  Ortho Devices Type of Ortho Device: Crutches Ortho Device/Splint Interventions: Application   Post Interventions Patient Tolerated: Well Instructions Provided: Care of device   Julia Hopkins Julia Hopkins 10/21/2019, 7:15 PM

## 2019-10-22 ENCOUNTER — Encounter (HOSPITAL_COMMUNITY): Payer: Self-pay | Admitting: Orthopedic Surgery

## 2019-10-25 ENCOUNTER — Telehealth: Payer: Self-pay | Admitting: Orthopedic Surgery

## 2019-10-25 ENCOUNTER — Telehealth: Payer: Self-pay

## 2019-10-25 NOTE — Telephone Encounter (Signed)
I called patient She states she removed ace bandage to take a shower but is unable to make it to the restroom without brace sliding towards ankle. She states she has been in bed since last night unable to move her leg. States that she is in severe pain.

## 2019-10-25 NOTE — Telephone Encounter (Signed)
Please advise. Thanks.  

## 2019-10-25 NOTE — Telephone Encounter (Signed)
Patient called.   She is requesting a call back to discuss some things she has been experiencing since surgery.   Call back: [phone]

## 2019-10-25 NOTE — Telephone Encounter (Signed)
Sending back to you as reminder to call pt

## 2019-10-25 NOTE — Telephone Encounter (Signed)
I will call patient back later tonight

## 2019-10-25 NOTE — Telephone Encounter (Signed)
Patient call back stating that the brace she has on for her right knee keeps on sliding down.  Stated that she is not able to elevate her right knee due to the brace sliding and that this is causing her to have pain.  Cb# [phone].  Please advise.  Thank you.

## 2019-10-26 NOTE — Telephone Encounter (Signed)
scheduled

## 2019-10-27 ENCOUNTER — Ambulatory Visit (INDEPENDENT_AMBULATORY_CARE_PROVIDER_SITE_OTHER): Payer: Medicaid Other | Admitting: Orthopedic Surgery

## 2019-10-27 ENCOUNTER — Other Ambulatory Visit: Payer: Self-pay | Admitting: Surgical

## 2019-10-27 ENCOUNTER — Telehealth: Payer: Self-pay

## 2019-10-27 ENCOUNTER — Telehealth: Payer: Self-pay | Admitting: Orthopedic Surgery

## 2019-10-27 DIAGNOSIS — S83511D Sprain of anterior cruciate ligament of right knee, subsequent encounter: Secondary | ICD-10-CM

## 2019-10-27 MED ORDER — OXYCODONE-ACETAMINOPHEN 10-325 MG PO TABS: 1.0000 | ORAL_TABLET | ORAL | 0 refills | Status: DC | PRN

## 2019-10-27 NOTE — Telephone Encounter (Signed)
Pt needs her rx sent to [facility] and would like a CB when this has been submitted to the pharmacy.  [phone]

## 2019-10-27 NOTE — Telephone Encounter (Signed)
IC advised done 

## 2019-10-27 NOTE — Telephone Encounter (Signed)
Patient is requesting a letter of some kind stating that she had surgery and how long she will be out of work? She states she has children and is unable to work and therefore having a hard time getting her bills paid. States she is trying to file for some type of temporary disability. Ok for this note? Can you please advise how long you may feel patient will need assistance?

## 2019-10-27 NOTE — Telephone Encounter (Signed)
3 mos

## 2019-10-28 ENCOUNTER — Telehealth: Payer: Self-pay | Admitting: Orthopedic Surgery

## 2019-10-28 MED FILL — OXYCODONE-APAP 10-325: 10-325 | 7 days supply | Qty: 42 | Fill #0

## 2019-10-28 NOTE — Telephone Encounter (Signed)
See below. I advised patient to only pick up the 7 days worth and when she ran out we can readdress. Her insurance only covers 7day supply

## 2019-10-28 NOTE — Telephone Encounter (Signed)
Sounds good.  She has what she has and she will have to space it out.

## 2019-10-28 NOTE — Telephone Encounter (Signed)
Patient called. Her insurance only authorized 7 days of the percocet. She would like to know if it is ok for her to do just 7. Her CB number is [phone]

## 2019-10-28 NOTE — Telephone Encounter (Signed)
Done. Advised patent. She will access through mychart.

## 2019-10-29 ENCOUNTER — Inpatient Hospital Stay: Payer: Medicaid Other | Admitting: Orthopedic Surgery

## 2019-10-30 ENCOUNTER — Encounter: Payer: Self-pay | Admitting: Orthopedic Surgery
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
## 2019-11-01 ENCOUNTER — Telehealth: Payer: Self-pay | Admitting: Orthopedic Surgery

## 2019-11-01 ENCOUNTER — Other Ambulatory Visit: Payer: Self-pay | Admitting: Surgical

## 2019-11-01 MED ORDER — METHOCARBAMOL 500 MG PO TABS: 500.0000 mg | ORAL_TABLET | Freq: Three times a day (TID) | ORAL | 0 refills | Status: DC | PRN

## 2019-11-01 MED FILL — METHOCARBAMOL 500 MG TABS: 500 | 10 days supply | Qty: 30 | Fill #0

## 2019-11-01 NOTE — Telephone Encounter (Signed)
Submitted

## 2019-11-01 NOTE — Telephone Encounter (Signed)
Patient called.   She needs a refill on her Robaxin sent to [facility]  Call back: [phone]

## 2019-11-01 NOTE — Telephone Encounter (Signed)
Pls advise.  

## 2019-11-02 NOTE — Telephone Encounter (Signed)
Spoke with patient advised

## 2019-11-04 ENCOUNTER — Ambulatory Visit (INDEPENDENT_AMBULATORY_CARE_PROVIDER_SITE_OTHER): Payer: Medicaid Other | Admitting: Orthopedic Surgery

## 2019-11-04 ENCOUNTER — Telehealth: Payer: Self-pay

## 2019-11-04 ENCOUNTER — Encounter: Payer: Self-pay | Admitting: Orthopedic Surgery

## 2019-11-04 DIAGNOSIS — S83511D Sprain of anterior cruciate ligament of right knee, subsequent encounter: Secondary | ICD-10-CM

## 2019-11-04 NOTE — Telephone Encounter (Signed)
Information provided to Dorene Sorrow with [employer] to try to get Medicaid to auth HHPT in 2 weeks. He will let us know if this request is denied.
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
## 2019-11-05 ENCOUNTER — Other Ambulatory Visit: Payer: Self-pay | Admitting: Surgical

## 2019-11-05 ENCOUNTER — Telehealth: Payer: Self-pay | Admitting: Orthopedic Surgery

## 2019-11-05 MED FILL — OXYCODONE-APAP 5-325MG: 5-325 | 7 days supply | Qty: 30 | Fill #0

## 2019-11-05 NOTE — Telephone Encounter (Signed)
Patient called again  Please refer to previous message. She needs this before the weekend

## 2019-11-05 NOTE — Telephone Encounter (Signed)
submitted

## 2019-11-05 NOTE — Telephone Encounter (Signed)
See below. Please advise.  Julia Hopkins Outpt

## 2019-11-05 NOTE — Telephone Encounter (Signed)
Pt would like a refill of her oxycodone pt states it's been a challenge to get her medicine and would like a CB when it's been called in.   [phone]

## 2019-11-05 NOTE — Telephone Encounter (Signed)
Pls advise.  

## 2019-11-11 ENCOUNTER — Telehealth: Payer: Self-pay | Admitting: Orthopedic Surgery

## 2019-11-11 ENCOUNTER — Other Ambulatory Visit: Payer: Self-pay | Admitting: Surgical

## 2019-11-11 MED ORDER — METHOCARBAMOL 500 MG PO TABS: 500.0000 mg | ORAL_TABLET | Freq: Three times a day (TID) | ORAL | 0 refills | Status: DC | PRN

## 2019-11-11 MED ORDER — OXYCODONE-ACETAMINOPHEN 5-325 MG PO TABS: 1.0000 | ORAL_TABLET | Freq: Four times a day (QID) | ORAL | 0 refills | Status: DC | PRN

## 2019-11-11 MED FILL — METHOCARBAMOL 500 MG TABS: 500 | 10 days supply | Qty: 30 | Fill #0

## 2019-11-11 MED FILL — OXYCODONE-APAP 5-325MG: 5-325 | 7 days supply | Qty: 30 | Fill #0

## 2019-11-11 NOTE — Telephone Encounter (Signed)
Julia Hopkins called in to request refills on 2 medications.  She would like a refill of the oxycodone 10mg  and methocarbamol.  She uses the .  She would also like to follow up on her request for Dr. [employer] to write a letter to her insurance company stating that she needs more than a 7 day Rx because her situation is "more serious" than usual and she cannot afford to pay her $3 copay each week.  She can be reached at [phone].

## 2019-11-11 NOTE — Telephone Encounter (Signed)
Pls advise.  

## 2019-11-11 NOTE — Telephone Encounter (Signed)
IC s/w patient and advised  

## 2019-11-12 ENCOUNTER — Telehealth: Payer: Self-pay | Admitting: Surgical

## 2019-11-12 NOTE — Telephone Encounter (Signed)
Patient called asked for a call back concerning her medication dosage change and frequency change. Patient said both has been changed.  Patient said she was told only the frequency should have been changed.The number to contact patient is [phone]

## 2019-11-13 ENCOUNTER — Other Ambulatory Visit: Payer: Self-pay | Admitting: Surgical

## 2019-11-15 NOTE — Telephone Encounter (Signed)
I spoke with patient. She states frequency and dosage changed. She is still in a lot of pain. Is asking to go back to the 10mg . Please advise.  She states she is stiff, miserable and cannot get comfortable.

## 2019-11-15 NOTE — Telephone Encounter (Signed)
I called.  Could really begin to think about refilling this until maybe Thursday.  She is can make you until then.  She basically took  24 pills in 2 days and that has too many.

## 2019-11-15 NOTE — Telephone Encounter (Signed)
Holding for you as FYI in case patient calls back for refill on Thursday.

## 2019-11-18 ENCOUNTER — Telehealth: Payer: Self-pay | Admitting: Orthopedic Surgery

## 2019-11-18 NOTE — Telephone Encounter (Signed)
Please advise 

## 2019-11-18 NOTE — Telephone Encounter (Signed)
Patient called requesting a refill on all medications on file. Please send to [facility] outpatient pharmacy. Please call patient at [phone].

## 2019-11-19 ENCOUNTER — Encounter: Payer: Self-pay | Admitting: Orthopedic Surgery

## 2019-11-19 ENCOUNTER — Ambulatory Visit (INDEPENDENT_AMBULATORY_CARE_PROVIDER_SITE_OTHER): Payer: Medicaid Other | Admitting: Orthopedic Surgery

## 2019-11-19 VITALS — Ht 61.0 in | Wt 282.0 lb

## 2019-11-19 DIAGNOSIS — S83511D Sprain of anterior cruciate ligament of right knee, subsequent encounter: Secondary | ICD-10-CM

## 2019-11-19 MED ORDER — MELOXICAM 15 MG PO TABS: 15.0000 mg | ORAL_TABLET | Freq: Every day | ORAL | 0 refills | Status: DC

## 2019-11-19 MED ORDER — OXYCODONE-ACETAMINOPHEN 5-325 MG PO TABS: 1.0000 | ORAL_TABLET | Freq: Three times a day (TID) | ORAL | 0 refills | Status: DC | PRN

## 2019-11-19 MED ORDER — METHOCARBAMOL 500 MG PO TABS: 500.0000 mg | ORAL_TABLET | Freq: Three times a day (TID) | ORAL | 0 refills | Status: DC | PRN

## 2019-11-19 MED ORDER — ASPIRIN 81 MG PO TBEC: DELAYED_RELEASE_TABLET | ORAL | 0 refills | Status: AC

## 2019-11-19 MED FILL — METHOCARBAMOL 500 MG TABS: 500 | 10 days supply | Qty: 30 | Fill #0

## 2019-11-19 MED FILL — MELOXICAM 15 MG TABLET: 15 | 30 days supply | Qty: 30 | Fill #0

## 2019-11-19 MED FILL — OXYCODONE-APAP 5-325MG: 5-325 | 7 days supply | Qty: 21 | Fill #0

## 2019-11-20 ENCOUNTER — Encounter: Payer: Self-pay | Admitting: Orthopedic Surgery
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
## 2019-11-23 ENCOUNTER — Other Ambulatory Visit: Payer: Self-pay

## 2019-11-23 ENCOUNTER — Ambulatory Visit: Payer: Medicaid Other | Attending: Orthopedic Surgery

## 2019-11-23 DIAGNOSIS — M6281 Muscle weakness (generalized): Secondary | ICD-10-CM | POA: Insufficient documentation

## 2019-11-23 DIAGNOSIS — R262 Difficulty in walking, not elsewhere classified: Secondary | ICD-10-CM | POA: Insufficient documentation

## 2019-11-23 DIAGNOSIS — M25661 Stiffness of right knee, not elsewhere classified: Secondary | ICD-10-CM | POA: Diagnosis present

## 2019-11-23 DIAGNOSIS — M25561 Pain in right knee: Secondary | ICD-10-CM | POA: Diagnosis not present
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
## 2019-11-25 ENCOUNTER — Other Ambulatory Visit: Payer: Self-pay | Admitting: Surgical

## 2019-11-25 ENCOUNTER — Telehealth: Payer: Self-pay | Admitting: Surgical

## 2019-11-25 MED ORDER — OXYCODONE-ACETAMINOPHEN 5-325 MG PO TABS: 1.0000 | ORAL_TABLET | Freq: Two times a day (BID) | ORAL | 0 refills | Status: DC | PRN

## 2019-11-25 MED FILL — OXYCODONE-APAP 5-325MG: 5-325 | 7 days supply | Qty: 14 | Fill #0

## 2019-11-25 NOTE — Telephone Encounter (Signed)
Pls advise.  

## 2019-11-25 NOTE — Telephone Encounter (Signed)
Submitted for oxycodone refill for 7 days.  Increased frequency to q12h

## 2019-11-25 NOTE — Telephone Encounter (Signed)
Spoke with patient and advised done.

## 2019-11-25 NOTE — Telephone Encounter (Signed)
Patient called.  She is requesting a refill on her pain medication, oxycodone. She stressed that it must only be a 7 day supply or her insurance will not accept it.   Call back:  [phone]

## 2019-11-30 ENCOUNTER — Other Ambulatory Visit: Payer: Self-pay

## 2019-11-30 ENCOUNTER — Ambulatory Visit: Payer: Medicaid Other

## 2019-11-30 DIAGNOSIS — M25661 Stiffness of right knee, not elsewhere classified: Secondary | ICD-10-CM

## 2019-11-30 DIAGNOSIS — M25561 Pain in right knee: Secondary | ICD-10-CM

## 2019-11-30 DIAGNOSIS — M6281 Muscle weakness (generalized): Secondary | ICD-10-CM

## 2019-11-30 DIAGNOSIS — R262 Difficulty in walking, not elsewhere classified: Secondary | ICD-10-CM
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
## 2019-12-02 ENCOUNTER — Ambulatory Visit (INDEPENDENT_AMBULATORY_CARE_PROVIDER_SITE_OTHER): Payer: Medicaid Other | Admitting: Surgical

## 2019-12-02 ENCOUNTER — Encounter: Payer: Self-pay | Admitting: Surgical

## 2019-12-02 DIAGNOSIS — S83511D Sprain of anterior cruciate ligament of right knee, subsequent encounter: Secondary | ICD-10-CM

## 2019-12-02 MED ORDER — OXYCODONE-ACETAMINOPHEN 5-325 MG PO TABS: 1.0000 | ORAL_TABLET | Freq: Two times a day (BID) | ORAL | 0 refills | Status: AC | PRN

## 2019-12-02 MED ORDER — FUROSEMIDE 20 MG PO TABS
20.0000 mg | ORAL_TABLET | Freq: Every day | ORAL | 0 refills | Status: DC
Start: 2019-12-02 — End: 2020-07-01

## 2019-12-02 MED ORDER — CELECOXIB 100 MG PO CAPS
100.0000 mg | ORAL_CAPSULE | Freq: Two times a day (BID) | ORAL | 0 refills | Status: DC
Start: 2019-12-02 — End: 2020-07-01

## 2019-12-02 MED FILL — OXYCODONE-APAP 5-325MG: 5-325 | 7 days supply | Qty: 14 | Fill #0

## 2019-12-02 MED FILL — CELECOXIB 100 MG CAPS: 100 | 30 days supply | Qty: 60 | Fill #0

## 2019-12-02 MED FILL — FUROSEMIDE 20 MG TABS: 20 | 5 days supply | Qty: 5 | Fill #0
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
## 2019-12-03 ENCOUNTER — Ambulatory Visit: Payer: Medicaid Other | Admitting: Surgical

## 2019-12-06 ENCOUNTER — Ambulatory Visit: Payer: Medicaid Other

## 2019-12-06 ENCOUNTER — Other Ambulatory Visit: Payer: Self-pay

## 2019-12-06 DIAGNOSIS — R262 Difficulty in walking, not elsewhere classified: Secondary | ICD-10-CM

## 2019-12-06 DIAGNOSIS — M25661 Stiffness of right knee, not elsewhere classified: Secondary | ICD-10-CM

## 2019-12-06 DIAGNOSIS — M6281 Muscle weakness (generalized): Secondary | ICD-10-CM

## 2019-12-06 DIAGNOSIS — M25561 Pain in right knee: Secondary | ICD-10-CM | POA: Diagnosis not present
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
## 2019-12-07 ENCOUNTER — Telehealth: Payer: Self-pay | Admitting: Orthopedic Surgery

## 2019-12-07 NOTE — Telephone Encounter (Signed)
FYI

## 2019-12-07 NOTE — Telephone Encounter (Signed)
Patient called to inform doctor that patient slipped off her right leg. Patient states leg is a little swollen but nothing to be concerned about. Patient just wanted the doctor to know. No need for a call back. Patient states to call if condition gets worse. Patient phone number is [phone].

## 2019-12-08 ENCOUNTER — Ambulatory Visit: Payer: Medicaid Other | Admitting: Physical Therapy

## 2019-12-10 ENCOUNTER — Telehealth: Payer: Self-pay | Admitting: Orthopedic Surgery

## 2019-12-10 ENCOUNTER — Other Ambulatory Visit: Payer: Self-pay | Admitting: Surgical

## 2019-12-10 MED ORDER — OXYCODONE-ACETAMINOPHEN 5-325 MG PO TABS: 1.0000 | ORAL_TABLET | Freq: Two times a day (BID) | ORAL | 0 refills | Status: DC | PRN

## 2019-12-10 MED ORDER — METHOCARBAMOL 500 MG PO TABS: 500.0000 mg | ORAL_TABLET | Freq: Three times a day (TID) | ORAL | 0 refills | Status: DC | PRN

## 2019-12-10 NOTE — Telephone Encounter (Signed)
Please advise 

## 2019-12-10 NOTE — Telephone Encounter (Signed)
Patient called.   Requesting a refill on her pain medication.   Call back: [phone]

## 2019-12-10 NOTE — Telephone Encounter (Signed)
submitted

## 2019-12-11 MED FILL — OXYCODONE-APAP 5-325MG: 5-325 | 7 days supply | Qty: 14 | Fill #0

## 2019-12-11 MED FILL — METHOCARBAMOL 500 MG TABS: 500 | 10 days supply | Qty: 30 | Fill #0

## 2019-12-14 ENCOUNTER — Ambulatory Visit (INDEPENDENT_AMBULATORY_CARE_PROVIDER_SITE_OTHER): Payer: Medicaid Other | Admitting: Orthopedic Surgery

## 2019-12-14 ENCOUNTER — Telehealth: Payer: Self-pay | Admitting: Orthopedic Surgery

## 2019-12-14 DIAGNOSIS — S83511D Sprain of anterior cruciate ligament of right knee, subsequent encounter: Secondary | ICD-10-CM

## 2019-12-14 NOTE — Telephone Encounter (Signed)
Done. Patient will pick up at front desk in the morning.

## 2019-12-14 NOTE — Progress Notes (Signed)
Patient came by to pick up Hinged Knee brace for right knee.

## 2019-12-14 NOTE — Telephone Encounter (Signed)
Patient called. She would like to be fitted for a knee brace. Her call back number is [phone]

## 2019-12-14 NOTE — Telephone Encounter (Signed)
  Please advise if ok for patient to resume donating plasma?  I spoke with patient. She will come in for hinged knee brace.

## 2019-12-14 NOTE — Telephone Encounter (Signed)
sure

## 2019-12-17 ENCOUNTER — Other Ambulatory Visit: Payer: Self-pay | Admitting: Surgical

## 2019-12-17 ENCOUNTER — Telehealth: Payer: Self-pay | Admitting: Orthopedic Surgery

## 2019-12-17 MED ORDER — METHOCARBAMOL 500 MG PO TABS: 500.0000 mg | ORAL_TABLET | Freq: Three times a day (TID) | ORAL | 0 refills | Status: DC | PRN

## 2019-12-17 MED ORDER — OXYCODONE-ACETAMINOPHEN 5-325 MG PO TABS: 1.0000 | ORAL_TABLET | Freq: Every day | ORAL | 0 refills | Status: AC | PRN

## 2019-12-17 MED FILL — OXYCODONE-APAP 5-325MG: 5-325 | 7 days supply | Qty: 7 | Fill #0

## 2019-12-17 NOTE — Telephone Encounter (Signed)
Please advise 

## 2019-12-17 NOTE — Telephone Encounter (Signed)
IC advised.  

## 2019-12-17 NOTE — Telephone Encounter (Signed)
Sent in refill. Decreased frequency

## 2019-12-17 NOTE — Telephone Encounter (Signed)
Pt would like a refill of her percocet rx sent in please; and a CB when it's been done  [phone]

## 2019-12-20 ENCOUNTER — Ambulatory Visit: Payer: Medicaid Other | Attending: Orthopedic Surgery

## 2019-12-20 ENCOUNTER — Other Ambulatory Visit: Payer: Self-pay

## 2019-12-20 DIAGNOSIS — M25561 Pain in right knee: Secondary | ICD-10-CM | POA: Insufficient documentation

## 2019-12-20 DIAGNOSIS — M6281 Muscle weakness (generalized): Secondary | ICD-10-CM | POA: Insufficient documentation

## 2019-12-20 DIAGNOSIS — R262 Difficulty in walking, not elsewhere classified: Secondary | ICD-10-CM | POA: Diagnosis present

## 2019-12-20 DIAGNOSIS — M25661 Stiffness of right knee, not elsewhere classified: Secondary | ICD-10-CM | POA: Diagnosis present
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
## 2019-12-23 ENCOUNTER — Telehealth: Payer: Self-pay | Admitting: Orthopedic Surgery

## 2019-12-23 ENCOUNTER — Other Ambulatory Visit: Payer: Self-pay | Admitting: Surgical

## 2019-12-23 MED ORDER — HYDROCODONE-ACETAMINOPHEN 5-325 MG PO TABS: 1.0000 | ORAL_TABLET | Freq: Every day | ORAL | 0 refills | Status: DC | PRN

## 2019-12-23 MED FILL — HYDROCODON-APAP 5-325: 5-325 | 7 days supply | Qty: 7 | Fill #0

## 2019-12-23 NOTE — Telephone Encounter (Signed)
Patient called requesting a refill of percocets. Please send to pharmacy on file. Patient phone number is [phone].

## 2019-12-23 NOTE — Telephone Encounter (Signed)
Sent in RX for [employer].

## 2019-12-23 NOTE — Telephone Encounter (Signed)
Please advise. Thanks.  

## 2019-12-23 NOTE — Telephone Encounter (Signed)
IC s/w pt advised

## 2019-12-29 ENCOUNTER — Encounter: Payer: Self-pay | Admitting: Physical Therapy

## 2019-12-29 ENCOUNTER — Other Ambulatory Visit: Payer: Self-pay

## 2019-12-29 ENCOUNTER — Ambulatory Visit: Payer: Medicaid Other | Admitting: Physical Therapy

## 2019-12-29 DIAGNOSIS — M6281 Muscle weakness (generalized): Secondary | ICD-10-CM

## 2019-12-29 DIAGNOSIS — M25661 Stiffness of right knee, not elsewhere classified: Secondary | ICD-10-CM

## 2019-12-29 DIAGNOSIS — M25561 Pain in right knee: Secondary | ICD-10-CM

## 2019-12-29 DIAGNOSIS — R262 Difficulty in walking, not elsewhere classified: Secondary | ICD-10-CM
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
## 2019-12-31 ENCOUNTER — Other Ambulatory Visit: Payer: Self-pay | Admitting: Surgical

## 2019-12-31 ENCOUNTER — Telehealth: Payer: Self-pay | Admitting: Orthopedic Surgery

## 2019-12-31 MED ORDER — HYDROCODONE-ACETAMINOPHEN 5-325 MG PO TABS: 1.0000 | ORAL_TABLET | Freq: Every day | ORAL | 0 refills | Status: DC | PRN

## 2019-12-31 MED FILL — HYDROCODON-APAP 5-325: 5-325 | 7 days supply | Qty: 7 | Fill #0

## 2019-12-31 NOTE — Telephone Encounter (Signed)
Last refill of hydrocodone and then she has to transition to OTC medications

## 2019-12-31 NOTE — Telephone Encounter (Signed)
Pt called stating she would like a refill of her hydrocodone sent in please

## 2019-12-31 NOTE — Telephone Encounter (Signed)
Please advise 

## 2020-01-03 NOTE — Telephone Encounter (Signed)
Patient aware of the below message  

## 2020-01-05 ENCOUNTER — Ambulatory Visit (INDEPENDENT_AMBULATORY_CARE_PROVIDER_SITE_OTHER): Payer: Medicaid Other | Admitting: Orthopedic Surgery

## 2020-01-05 ENCOUNTER — Encounter: Payer: Self-pay | Admitting: Orthopedic Surgery

## 2020-01-05 DIAGNOSIS — S83511D Sprain of anterior cruciate ligament of right knee, subsequent encounter: Secondary | ICD-10-CM
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
## 2020-01-06 ENCOUNTER — Ambulatory Visit: Payer: Medicaid Other

## 2020-01-11 ENCOUNTER — Ambulatory Visit: Payer: Medicaid Other | Admitting: Physical Therapy

## 2020-01-19 ENCOUNTER — Other Ambulatory Visit: Payer: Self-pay

## 2020-01-19 ENCOUNTER — Encounter: Payer: Self-pay | Admitting: Physical Therapy

## 2020-01-19 ENCOUNTER — Ambulatory Visit: Payer: Medicaid Other | Attending: Orthopedic Surgery | Admitting: Physical Therapy

## 2020-01-19 DIAGNOSIS — R262 Difficulty in walking, not elsewhere classified: Secondary | ICD-10-CM | POA: Insufficient documentation

## 2020-01-19 DIAGNOSIS — M25661 Stiffness of right knee, not elsewhere classified: Secondary | ICD-10-CM | POA: Diagnosis present

## 2020-01-19 DIAGNOSIS — M25561 Pain in right knee: Secondary | ICD-10-CM | POA: Diagnosis present

## 2020-01-19 DIAGNOSIS — M6281 Muscle weakness (generalized): Secondary | ICD-10-CM | POA: Diagnosis present
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
## 2020-01-27 ENCOUNTER — Ambulatory Visit: Payer: Medicaid Other | Admitting: Physical Therapy

## 2020-02-02 ENCOUNTER — Other Ambulatory Visit: Payer: Self-pay

## 2020-02-02 ENCOUNTER — Ambulatory Visit: Payer: Medicaid Other | Admitting: Physical Therapy

## 2020-02-02 ENCOUNTER — Encounter: Payer: Self-pay | Admitting: Physical Therapy

## 2020-02-02 DIAGNOSIS — M6281 Muscle weakness (generalized): Secondary | ICD-10-CM

## 2020-02-02 DIAGNOSIS — M25561 Pain in right knee: Secondary | ICD-10-CM

## 2020-02-02 DIAGNOSIS — R262 Difficulty in walking, not elsewhere classified: Secondary | ICD-10-CM

## 2020-02-02 DIAGNOSIS — M25661 Stiffness of right knee, not elsewhere classified: Secondary | ICD-10-CM
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
## 2020-02-08 ENCOUNTER — Other Ambulatory Visit: Payer: Self-pay

## 2020-02-08 ENCOUNTER — Ambulatory Visit: Payer: Medicaid Other | Attending: Orthopedic Surgery | Admitting: Physical Therapy

## 2020-02-08 DIAGNOSIS — M6281 Muscle weakness (generalized): Secondary | ICD-10-CM | POA: Diagnosis present

## 2020-02-08 DIAGNOSIS — R262 Difficulty in walking, not elsewhere classified: Secondary | ICD-10-CM | POA: Diagnosis present

## 2020-02-08 DIAGNOSIS — M25561 Pain in right knee: Secondary | ICD-10-CM | POA: Insufficient documentation

## 2020-02-08 DIAGNOSIS — M25661 Stiffness of right knee, not elsewhere classified: Secondary | ICD-10-CM | POA: Diagnosis present

## 2020-02-09 ENCOUNTER — Ambulatory Visit: Payer: Medicaid Other | Admitting: Orthopedic Surgery

## 2020-02-09 ENCOUNTER — Encounter: Payer: Medicaid Other | Admitting: Physical Therapy
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
## 2020-02-16 ENCOUNTER — Other Ambulatory Visit: Payer: Self-pay

## 2020-02-16 ENCOUNTER — Ambulatory Visit: Payer: Medicaid Other | Admitting: Physical Therapy

## 2020-02-16 DIAGNOSIS — M25661 Stiffness of right knee, not elsewhere classified: Secondary | ICD-10-CM

## 2020-02-16 DIAGNOSIS — R262 Difficulty in walking, not elsewhere classified: Secondary | ICD-10-CM

## 2020-02-16 DIAGNOSIS — M25561 Pain in right knee: Secondary | ICD-10-CM

## 2020-02-16 DIAGNOSIS — M6281 Muscle weakness (generalized): Secondary | ICD-10-CM
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
## 2020-02-21 ENCOUNTER — Ambulatory Visit (INDEPENDENT_AMBULATORY_CARE_PROVIDER_SITE_OTHER): Payer: Medicaid Other | Admitting: Orthopedic Surgery

## 2020-02-21 ENCOUNTER — Other Ambulatory Visit: Payer: Self-pay

## 2020-02-21 DIAGNOSIS — S83511D Sprain of anterior cruciate ligament of right knee, subsequent encounter: Secondary | ICD-10-CM

## 2020-02-23 ENCOUNTER — Ambulatory Visit: Payer: Medicaid Other | Admitting: Physical Therapy

## 2020-02-24 ENCOUNTER — Telehealth: Payer: Self-pay | Admitting: Orthopedic Surgery

## 2020-02-24 NOTE — Telephone Encounter (Signed)
Patient called. Says she would like to know if she could get a work note to keep her feet up while at work. Her call back number is [phone]

## 2020-02-24 NOTE — Telephone Encounter (Signed)
IC advised done. She will access through [employer]

## 2020-02-24 NOTE — Telephone Encounter (Signed)
See below. Please advise. Thanks.  

## 2020-02-24 NOTE — Telephone Encounter (Signed)
Sure for 6 weeks thx

## 2020-02-26 ENCOUNTER — Encounter: Payer: Self-pay | Admitting: Orthopedic Surgery
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
## 2020-03-01 ENCOUNTER — Encounter: Payer: Medicaid Other | Admitting: Physical Therapy

## 2020-03-06 ENCOUNTER — Encounter: Payer: Self-pay | Admitting: Physical Therapy

## 2020-03-06 ENCOUNTER — Ambulatory Visit: Payer: Medicaid Other | Admitting: Physical Therapy

## 2020-03-06 ENCOUNTER — Other Ambulatory Visit: Payer: Self-pay

## 2020-03-06 DIAGNOSIS — R262 Difficulty in walking, not elsewhere classified: Secondary | ICD-10-CM

## 2020-03-06 DIAGNOSIS — M25661 Stiffness of right knee, not elsewhere classified: Secondary | ICD-10-CM

## 2020-03-06 DIAGNOSIS — M6281 Muscle weakness (generalized): Secondary | ICD-10-CM

## 2020-03-06 DIAGNOSIS — M25561 Pain in right knee: Secondary | ICD-10-CM
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
## 2020-03-09 ENCOUNTER — Telehealth: Payer: Self-pay | Admitting: Orthopedic Surgery

## 2020-03-09 NOTE — Telephone Encounter (Signed)
Please advise on all of below. Thanks.

## 2020-03-09 NOTE — Telephone Encounter (Signed)
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 Patient said HR asked for the note to state patients next plan of care after the 6 weeks are up. Patient also wanted to know can she elevate her leg at work if her leg starts to swell?  Patient also asked if Dr August Saucer can add to the note that she can be assigned a permanent seat at work because now she is required to move around and take her desk and chair with her. Patient also asked if Dr August Saucer will add to the note that she can be allowed to wear flat shoes or tennis shoes while working. Patient said she now is required to wear heels.   Patient said if she does not answer her phone to please leave a message. The number to contact patient is [phone]

## 2020-03-10 ENCOUNTER — Ambulatory Visit: Payer: Medicaid Other | Admitting: Physical Therapy

## 2020-03-10 NOTE — Telephone Encounter (Signed)
Tried calling patient to discuss. No answer. Unable to LM due no VM. I did generate note and release to mychart.

## 2020-03-10 NOTE — Telephone Encounter (Signed)
Okay to elevate legs at work if the leg swells Okay for flat shoes or tennis shoes while working.  Heels would be difficult in her situation I think that the moving around and taking the Dex chair with her is not really in my purview for her at this stage of recovery.  I do not see that is a major problem.  Okay to put the top two things in a note to send the HR.  I released her in November.

## 2020-03-13 ENCOUNTER — Ambulatory Visit: Payer: Medicaid Other | Attending: Orthopedic Surgery | Admitting: Physical Therapy

## 2020-03-13 ENCOUNTER — Other Ambulatory Visit: Payer: Self-pay

## 2020-03-13 ENCOUNTER — Encounter: Payer: Self-pay | Admitting: Physical Therapy

## 2020-03-13 DIAGNOSIS — M25561 Pain in right knee: Secondary | ICD-10-CM | POA: Insufficient documentation

## 2020-03-13 DIAGNOSIS — R262 Difficulty in walking, not elsewhere classified: Secondary | ICD-10-CM | POA: Insufficient documentation

## 2020-03-13 DIAGNOSIS — M25661 Stiffness of right knee, not elsewhere classified: Secondary | ICD-10-CM | POA: Diagnosis present

## 2020-03-13 DIAGNOSIS — M6281 Muscle weakness (generalized): Secondary | ICD-10-CM | POA: Diagnosis present
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
## 2020-03-15 ENCOUNTER — Telehealth: Payer: Self-pay | Admitting: Orthopedic Surgery

## 2020-03-15 NOTE — Telephone Encounter (Signed)
Pt called and would like for the form to be filled out for work that they faxed over last week.  Pt is allowed to wear sneakers, tennis shoes flats.  Pt is allowed to elevate leg for 6 hour throughout the day IF NEEDED.  Sit / stand desk accomodation.   Pt sated her work faxed the form over to fill out and there is any questions we can call the number on the form that was faxed over.

## 2020-03-15 NOTE — Telephone Encounter (Signed)
IC advised did not have form.  She will have them to refax to my attention

## 2020-03-17 ENCOUNTER — Telehealth: Payer: Self-pay | Admitting: Orthopedic Surgery

## 2020-03-17 ENCOUNTER — Encounter: Payer: Medicaid Other | Admitting: Physical Therapy

## 2020-03-17 NOTE — Telephone Encounter (Signed)
Form given to Dr August Saucer to complete

## 2020-03-17 NOTE — Telephone Encounter (Signed)
Pt called that she needs her ADA forms faxed over to her employer by no later than Tuesday 03/21/20 afternoon. If any questions you can call her at [phone].

## 2020-03-20 NOTE — Telephone Encounter (Signed)
Form faxed on Friday. Tried notifying patient.

## 2020-03-20 NOTE — Telephone Encounter (Signed)
See other note

## 2020-03-29 ENCOUNTER — Other Ambulatory Visit: Payer: Self-pay

## 2020-03-29 ENCOUNTER — Ambulatory Visit (HOSPITAL_COMMUNITY)
Admission: EM | Admit: 2020-03-29 | Discharge: 2020-03-29 | Disposition: A | Discharge status: Home / Self Care | Payer: Medicaid Other | Attending: Family Medicine | Admitting: Family Medicine

## 2020-03-29 ENCOUNTER — Encounter (HOSPITAL_COMMUNITY): Payer: Self-pay

## 2020-03-29 DIAGNOSIS — Z20822 Contact with and (suspected) exposure to covid-19: Secondary | ICD-10-CM | POA: Insufficient documentation

## 2020-03-29 DIAGNOSIS — R197 Diarrhea, unspecified: Secondary | ICD-10-CM | POA: Insufficient documentation

## 2020-03-29 DIAGNOSIS — R059 Cough, unspecified: Secondary | ICD-10-CM | POA: Diagnosis present

## 2020-03-29 DIAGNOSIS — J029 Acute pharyngitis, unspecified: Secondary | ICD-10-CM | POA: Insufficient documentation

## 2020-03-29 DIAGNOSIS — R0989 Other specified symptoms and signs involving the circulatory and respiratory systems: Secondary | ICD-10-CM | POA: Insufficient documentation

## 2020-03-29 NOTE — Discharge Instructions (Signed)
You have been tested for COVID-19 today. °If your test returns positive, you will receive a phone call from [place] regarding your results. °Negative test results are not called. °Both positive and negative results area always visible on MyChart. °If you do not have a MyChart account, sign up instructions are provided in your discharge papers. °Please do not hesitate to contact us should you have questions or concerns. ° °

## 2020-03-29 NOTE — ED Triage Notes (Signed)
Pt presents with diarrhea, cough, sore throat, runny nose X 3 days. Pt states she had been drinking teas and gargling with warm water and salt. Pt states she was exposed. Pt states she was sometimes has stomach pain.

## 2020-03-30 LAB — SARS CORONAVIRUS 2 (TAT 6-24 HRS): SARS Coronavirus 2: NEGATIVE
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
## 2020-04-11 ENCOUNTER — Other Ambulatory Visit (HOSPITAL_COMMUNITY): Payer: Self-pay | Admitting: Neurosurgery

## 2020-04-17 MED FILL — TOPIRAMATE 25 MG TABLET: 25 | 30 days supply | Qty: 60 | Fill #0

## 2020-04-25 ENCOUNTER — Ambulatory Visit (HOSPITAL_COMMUNITY)
Admission: EM | Admit: 2020-04-25 | Discharge: 2020-04-25 | Disposition: A | Discharge status: Home / Self Care | Payer: Medicaid Other | Attending: Student | Admitting: Student

## 2020-04-25 ENCOUNTER — Encounter (HOSPITAL_COMMUNITY): Payer: Self-pay

## 2020-04-25 DIAGNOSIS — U071 COVID-19: Secondary | ICD-10-CM | POA: Insufficient documentation

## 2020-04-25 DIAGNOSIS — J069 Acute upper respiratory infection, unspecified: Secondary | ICD-10-CM | POA: Diagnosis not present

## 2020-04-25 DIAGNOSIS — Z20822 Contact with and (suspected) exposure to covid-19: Secondary | ICD-10-CM | POA: Diagnosis present

## 2020-04-25 NOTE — ED Triage Notes (Addendum)
Pt presents with sore throat and cough pain in back and cold chills, ha and sharp pain in stomach. Starting today sent home from work. Patients daughter who lives with her is positive for covid today. Pt has taken ibuprofen today.  Not vaccinated for covid but was vaccinated for the flu
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
## 2020-04-25 NOTE — Discharge Instructions (Addendum)
-  For fevers/chills, body aches, headaches- use Tylenol and Ibuprofen. You can alternate these for maximum effect. Use up to 3000mg  Tylenol daily and 3200mg  Ibuprofen daily. Make sure to take ibuprofen with food. Check the bottle of ibuprofen/tylenol for specific dosage instructions. -If you require negative covid test for work, try to get a rapid antigen test. Most drive through testing sites have these available.   We are currently awaiting result of your PCR covid-19 test. This typically comes back in 1-2 days. We'll call you if the result is positive. Otherwise, the result will be sent electronically to your MyChart. You can also call this clinic and ask for your result via telephone.   Please isolate at home while awaiting these results. If your test is positive for Covid-19, continue to isolate at home for 5 days if you have mild symptoms, or a total of 10 days from symptom onset if you have more severe symptoms. If you quarantine for a shorter period of time (i.e. 5 days), make sure to wear a mask until day 10 of symptoms. Treat your symptoms at home with OTC remedies like tylenol/ibuprofen, mucinex, nyquil, etc. Seek medical attention if you develop high fevers, chest pain, shortness of breath, ear pain, facial pain, etc. Make sure to get up and move around every 2-3 hours while convalescing to help prevent blood clots. Drink plenty of fluids, and rest as much as possible.

## 2020-04-26 LAB — SARS CORONAVIRUS 2 (TAT 6-24 HRS): SARS Coronavirus 2: POSITIVE — AB

## 2020-04-27 ENCOUNTER — Encounter: Payer: Self-pay | Admitting: Physician Assistant

## 2020-06-26 ENCOUNTER — Encounter (HOSPITAL_COMMUNITY): Payer: Self-pay

## 2020-06-26 ENCOUNTER — Other Ambulatory Visit: Payer: Self-pay

## 2020-06-26 DIAGNOSIS — R0981 Nasal congestion: Secondary | ICD-10-CM | POA: Insufficient documentation

## 2020-06-26 DIAGNOSIS — I10 Essential (primary) hypertension: Secondary | ICD-10-CM | POA: Insufficient documentation

## 2020-06-26 DIAGNOSIS — Z8673 Personal history of transient ischemic attack (TIA), and cerebral infarction without residual deficits: Secondary | ICD-10-CM | POA: Diagnosis not present

## 2020-06-26 DIAGNOSIS — R067 Sneezing: Secondary | ICD-10-CM | POA: Insufficient documentation

## 2020-06-26 DIAGNOSIS — R52 Pain, unspecified: Secondary | ICD-10-CM | POA: Insufficient documentation

## 2020-06-26 DIAGNOSIS — Z8616 Personal history of COVID-19: Secondary | ICD-10-CM | POA: Diagnosis not present

## 2020-06-26 DIAGNOSIS — F17210 Nicotine dependence, cigarettes, uncomplicated: Secondary | ICD-10-CM | POA: Insufficient documentation

## 2020-06-26 DIAGNOSIS — Z7982 Long term (current) use of aspirin: Secondary | ICD-10-CM | POA: Diagnosis not present

## 2020-06-26 DIAGNOSIS — R062 Wheezing: Secondary | ICD-10-CM | POA: Insufficient documentation

## 2020-06-26 DIAGNOSIS — Z20822 Contact with and (suspected) exposure to covid-19: Secondary | ICD-10-CM | POA: Diagnosis not present

## 2020-06-26 DIAGNOSIS — J3489 Other specified disorders of nose and nasal sinuses: Secondary | ICD-10-CM | POA: Diagnosis not present

## 2020-06-26 DIAGNOSIS — Z79899 Other long term (current) drug therapy: Secondary | ICD-10-CM | POA: Insufficient documentation

## 2020-06-26 NOTE — ED Triage Notes (Signed)
Pt informs triage that she does not feel well. Sts sneezing, cough, nasal congestion, and the hots and colds for 2 days.

## 2020-06-27 ENCOUNTER — Emergency Department (HOSPITAL_COMMUNITY)
Admission: EM | Admit: 2020-06-27 | Discharge: 2020-06-27 | Disposition: A | Discharge status: Home / Self Care | Payer: BC Managed Care – PPO | Attending: Emergency Medicine | Admitting: Emergency Medicine

## 2020-06-27 DIAGNOSIS — R062 Wheezing: Secondary | ICD-10-CM

## 2020-06-27 DIAGNOSIS — R067 Sneezing: Secondary | ICD-10-CM

## 2020-06-27 DIAGNOSIS — R52 Pain, unspecified: Secondary | ICD-10-CM

## 2020-06-27 LAB — SARS CORONAVIRUS 2 (TAT 6-24 HRS): SARS Coronavirus 2: NEGATIVE

## 2020-06-27 MED ORDER — ALBUTEROL SULFATE (2.5 MG/3ML) 0.083% IN NEBU
5.0000 mg | INHALATION_SOLUTION | Freq: Once | RESPIRATORY_TRACT | Status: AC
  Administered 2020-06-27: 5 mg via RESPIRATORY_TRACT
  Filled 2020-06-27: qty 6

## 2020-06-27 MED ORDER — IPRATROPIUM BROMIDE 0.02 % IN SOLN
0.5000 mg | Freq: Once | RESPIRATORY_TRACT | Status: AC
  Administered 2020-06-27: 0.5 mg via RESPIRATORY_TRACT
  Filled 2020-06-27: qty 2.5

## 2020-06-27 NOTE — ED Notes (Signed)
Pt called with question regarding Covid test. Opened chart to answer questions.
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
## 2020-06-27 NOTE — Discharge Instructions (Signed)
You will notified if covid test is positive. May wish to start daily allergy medication such as claritin, zyrtec, etc. Follow-up with your primary care doctor. Return to the ED for new or worsening symptoms.

## 2020-06-28 ENCOUNTER — Other Ambulatory Visit (HOSPITAL_COMMUNITY): Payer: Self-pay | Admitting: Family Medicine

## 2020-07-01 ENCOUNTER — Emergency Department (HOSPITAL_COMMUNITY): Payer: BC Managed Care – PPO

## 2020-07-01 ENCOUNTER — Other Ambulatory Visit: Payer: Self-pay

## 2020-07-01 ENCOUNTER — Other Ambulatory Visit (HOSPITAL_COMMUNITY): Payer: Self-pay | Admitting: Emergency Medicine

## 2020-07-01 ENCOUNTER — Emergency Department (HOSPITAL_COMMUNITY)
Admission: EM | Admit: 2020-07-01 | Discharge: 2020-07-01 | Disposition: A | Discharge status: Home / Self Care | Payer: BC Managed Care – PPO | Attending: Emergency Medicine | Admitting: Emergency Medicine

## 2020-07-01 DIAGNOSIS — Z20822 Contact with and (suspected) exposure to covid-19: Secondary | ICD-10-CM | POA: Insufficient documentation

## 2020-07-01 DIAGNOSIS — I10 Essential (primary) hypertension: Secondary | ICD-10-CM | POA: Insufficient documentation

## 2020-07-01 DIAGNOSIS — R509 Fever, unspecified: Secondary | ICD-10-CM | POA: Insufficient documentation

## 2020-07-01 DIAGNOSIS — F17210 Nicotine dependence, cigarettes, uncomplicated: Secondary | ICD-10-CM | POA: Insufficient documentation

## 2020-07-01 DIAGNOSIS — R0602 Shortness of breath: Secondary | ICD-10-CM

## 2020-07-01 DIAGNOSIS — J4521 Mild intermittent asthma with (acute) exacerbation: Secondary | ICD-10-CM

## 2020-07-01 LAB — COMPREHENSIVE METABOLIC PANEL
ALT: 25 U/L (ref 0–44)
AST: 21 U/L (ref 15–41)
Albumin: 4.1 g/dL (ref 3.5–5.0)
Alkaline Phosphatase: 89 U/L (ref 38–126)
Anion gap: 10 (ref 5–15)
BUN: 11 mg/dL (ref 6–20)
CO2: 19 mmol/L — ABNORMAL LOW (ref 22–32)
Calcium: 9.3 mg/dL (ref 8.9–10.3)
Chloride: 110 mmol/L (ref 98–111)
Creatinine, Ser: 0.66 mg/dL (ref 0.44–1.00)
GFR, Estimated: >60 mL/min (ref >60)
Glucose, Bld: 104 mg/dL — ABNORMAL HIGH (ref 70–99)
Potassium: 3.7 mmol/L (ref 3.5–5.1)
Sodium: 139 mmol/L (ref 135–145)
Total Bilirubin: 0.4 mg/dL (ref 0.3–1.2)
Total Protein: 7.8 g/dL (ref 6.5–8.1)

## 2020-07-01 LAB — RESP PANEL BY RT-PCR (FLU A&B, COVID) ARPGX2
Influenza A by PCR: NEGATIVE
Influenza B by PCR: NEGATIVE
SARS Coronavirus 2 by RT PCR: NEGATIVE

## 2020-07-01 LAB — CBC WITH DIFFERENTIAL/PLATELET
Abs Immature Granulocytes: 0.08 10*3/uL — ABNORMAL HIGH (ref 0.00–0.07)
Basophils Absolute: 0.1 10*3/uL (ref 0.0–0.1)
Basophils Relative: 1 %
Eosinophils Absolute: 0.2 10*3/uL (ref 0.0–0.5)
Eosinophils Relative: 2 %
HCT: 41.4 % (ref 36.0–46.0)
Hemoglobin: 13.5 g/dL (ref 12.0–15.0)
Immature Granulocytes: 1 %
Lymphocytes Relative: 23 %
Lymphs Abs: 2.3 10*3/uL (ref 0.7–4.0)
MCH: 27.5 pg (ref 26.0–34.0)
MCHC: 32.6 g/dL (ref 30.0–36.0)
MCV: 84.3 fL (ref 80.0–100.0)
Monocytes Absolute: 0.8 10*3/uL (ref 0.1–1.0)
Monocytes Relative: 7 %
Neutro Abs: 6.7 10*3/uL (ref 1.7–7.7)
Neutrophils Relative %: 66 %
Platelets: 379 10*3/uL (ref 150–400)
RBC: 4.91 MIL/uL (ref 3.87–5.11)
RDW: 13.3 % (ref 11.5–15.5)
WBC: 10.1 10*3/uL (ref 4.0–10.5)
nRBC: 0 % (ref 0.0–0.2)

## 2020-07-01 LAB — I-STAT BETA HCG BLOOD, ED (MC, WL, AP ONLY): I-stat hCG, quantitative: <5 m[IU]/mL (ref <5)

## 2020-07-01 MED ORDER — AZITHROMYCIN 250 MG PO TABS: 250.0000 mg | ORAL_TABLET | Freq: Every day | ORAL | 0 refills | Status: DC

## 2020-07-01 MED ORDER — METHYLPREDNISOLONE SODIUM SUCC 125 MG IJ SOLR
125.0000 mg | Freq: Once | INTRAMUSCULAR | Status: AC
  Administered 2020-07-01: 125 mg via INTRAVENOUS
  Filled 2020-07-01: qty 2

## 2020-07-01 MED ORDER — PREDNISONE 20 MG PO TABS: 40.0000 mg | ORAL_TABLET | Freq: Every day | ORAL | 0 refills | Status: DC

## 2020-07-01 MED ORDER — SODIUM CHLORIDE 0.9 % IV BOLUS
1000.0000 mL | Freq: Once | INTRAVENOUS | Status: AC
  Administered 2020-07-01: 1000 mL via INTRAVENOUS

## 2020-07-01 MED ORDER — AEROCHAMBER Z-STAT PLUS/MEDIUM MISC
Status: AC
  Filled 2020-07-01: qty 1

## 2020-07-01 MED ORDER — ALBUTEROL SULFATE HFA 108 (90 BASE) MCG/ACT IN AERS
4.0000 | INHALATION_SPRAY | Freq: Once | RESPIRATORY_TRACT | Status: AC
  Administered 2020-07-01: 4 via RESPIRATORY_TRACT
  Filled 2020-07-01: qty 6.7

## 2020-07-01 MED ORDER — ALBUTEROL SULFATE HFA 108 (90 BASE) MCG/ACT IN AERS: 1.0000 | INHALATION_SPRAY | Freq: Four times a day (QID) | RESPIRATORY_TRACT | 0 refills | Status: DC | PRN

## 2020-07-01 MED ORDER — MAGNESIUM SULFATE 2 GM/50ML IV SOLN
2.0000 g | Freq: Once | INTRAVENOUS | Status: AC
  Administered 2020-07-01: 2 g via INTRAVENOUS
  Filled 2020-07-01: qty 50

## 2020-07-01 NOTE — ED Triage Notes (Signed)
Pt arrived POV, audibly wheezing, working to breath, hx of asthma. Only able to speak in short sentences. Was seen recently for same, told it was asthma exacerbation.
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
## 2020-07-01 NOTE — ED Notes (Signed)
Pt resting in bed, no s/s of distress at this time. Respirations even and unlabored.

## 2020-07-01 NOTE — ED Notes (Signed)
Writer went in to give pt her d/c paperwork and pt was on phone. Writer asked pt was she ready for her d/c paperwork and she stated yes. Writer went over prescriptions and follow up care. Pt then stated to person on phone she was leaving and would be in touch with her lawyer about the service.

## 2020-07-01 NOTE — ED Notes (Signed)
Pt is stating she is leaving AMA she is tired of being here and states the MD was coming to walk her to her car over an hour ago. MD aware of pt statement.

## 2020-07-01 NOTE — ED Notes (Signed)
Pt refusing vitals.

## 2020-07-01 NOTE — ED Notes (Signed)
Pt complaining that staff is not helping her. Writer explained to her that we have other patients along with her and we are doing our best. We had been waiting on Covid test to come back.

## 2020-07-01 NOTE — Discharge Instructions (Signed)
Take prednisone as directed.   Take antibiotics as directed. Please take all of your antibiotics until finished.  Use albuterol inhaler as directed.   Return to the Emergency Dept for any worsening shortness of breath, cough, chest pain or any other worsening or concerning symptoms.

## 2020-07-04 MED FILL — AZITHROMYCIN 250 MG TABS: 250 | 6 days supply | Qty: 6 | Fill #0

## 2020-07-04 MED FILL — predniSONE 20 MG TABS: 20 | 4 days supply | Qty: 8 | Fill #0

## 2020-07-11 ENCOUNTER — Other Ambulatory Visit (HOSPITAL_COMMUNITY): Payer: Self-pay

## 2020-07-11 MED ORDER — ALBUTEROL SULFATE HFA 108 (90 BASE) MCG/ACT IN AERS
INHALATION_SPRAY | RESPIRATORY_TRACT | 4 refills | Status: AC
  Filled 2020-07-11: qty 8.5, 16d supply, fill #0
  Filled 2020-09-11: qty 8.5, 16d supply, fill #1

## 2020-07-11 MED FILL — Chlorphen-Phenylephrine w/ APAP Tab 4-10-325 MG: ORAL | 8 days supply | Qty: 21 | Fill #0 | Status: AC

## 2020-09-11 ENCOUNTER — Other Ambulatory Visit (HOSPITAL_COMMUNITY): Payer: Self-pay

## 2020-09-11 MED FILL — Chlorphen-Phenylephrine w/ APAP Tab 4-10-325 MG: ORAL | 8 days supply | Qty: 21 | Fill #1 | Status: AC

## 2020-09-13 ENCOUNTER — Other Ambulatory Visit (HOSPITAL_COMMUNITY): Payer: Self-pay

## 2020-09-18 ENCOUNTER — Other Ambulatory Visit (HOSPITAL_COMMUNITY): Payer: Self-pay
